# Patient Record
Sex: Female | Born: 1950 | Hispanic: No | Smoking: Never smoker
Health system: Southern US, Community
[De-identification: ages and names within clinical notes are randomized; demographics above are authoritative.]

## PROBLEM LIST (undated history)

## (undated) DIAGNOSIS — E119 Type 2 diabetes mellitus without complications: Secondary | ICD-10-CM

## (undated) DIAGNOSIS — I639 Cerebral infarction, unspecified: Secondary | ICD-10-CM

## (undated) DIAGNOSIS — G4733 Obstructive sleep apnea (adult) (pediatric): Secondary | ICD-10-CM

## (undated) DIAGNOSIS — M545 Low back pain, unspecified: Secondary | ICD-10-CM

## (undated) DIAGNOSIS — F419 Anxiety disorder, unspecified: Secondary | ICD-10-CM

## (undated) DIAGNOSIS — H409 Unspecified glaucoma: Secondary | ICD-10-CM

## (undated) DIAGNOSIS — I517 Cardiomegaly: Principal | ICD-10-CM

## (undated) DIAGNOSIS — I10 Essential (primary) hypertension: Secondary | ICD-10-CM

## (undated) DIAGNOSIS — Z974 Presence of external hearing-aid: Secondary | ICD-10-CM

## (undated) DIAGNOSIS — Z923 Personal history of irradiation: Secondary | ICD-10-CM

## (undated) HISTORY — DX: Anxiety disorder, unspecified: F41.9

## (undated) HISTORY — PX: BREAST LUMPECTOMY: SHX2

## (undated) HISTORY — DX: Unspecified glaucoma: H40.9

## (undated) HISTORY — PX: PARTIAL HYSTERECTOMY: SHX80

## (undated) HISTORY — DX: Essential (primary) hypertension: I10

## (undated) HISTORY — DX: Cardiomegaly: I51.7

## (undated) HISTORY — DX: Presence of external hearing-aid: Z97.4

## (undated) HISTORY — DX: Cerebral infarction, unspecified: I63.9

## (undated) HISTORY — DX: Low back pain, unspecified: M54.50

## (undated) HISTORY — DX: Obstructive sleep apnea (adult) (pediatric): G47.33

## (undated) HISTORY — PX: EYE SURGERY: SHX253

---

## 1980-11-27 HISTORY — PX: TUBAL LIGATION: SHX77

## 2018-11-29 ENCOUNTER — Ambulatory Visit: Payer: Medicare Other | Admitting: Podiatry

## 2018-12-10 ENCOUNTER — Ambulatory Visit (INDEPENDENT_AMBULATORY_CARE_PROVIDER_SITE_OTHER): Payer: Medicare Other | Admitting: Podiatry

## 2018-12-10 ENCOUNTER — Encounter: Payer: Self-pay | Admitting: Podiatry

## 2018-12-10 VITALS — BP 134/80

## 2018-12-10 DIAGNOSIS — D1724 Benign lipomatous neoplasm of skin and subcutaneous tissue of left leg: Secondary | ICD-10-CM | POA: Diagnosis not present

## 2018-12-10 DIAGNOSIS — L84 Corns and callosities: Secondary | ICD-10-CM | POA: Diagnosis not present

## 2018-12-10 DIAGNOSIS — D1723 Benign lipomatous neoplasm of skin and subcutaneous tissue of right leg: Secondary | ICD-10-CM

## 2018-12-10 NOTE — Patient Instructions (Signed)

## 2018-12-24 ENCOUNTER — Ambulatory Visit: Payer: Medicare Other | Admitting: Sports Medicine

## 2018-12-28 ENCOUNTER — Encounter: Payer: Self-pay | Admitting: Podiatry

## 2018-12-28 NOTE — Progress Notes (Signed)
Subjective: [facility] Th[facility] presents today with cc calluses and corns.  She also complains of bulge in both of her ankles.  She relates the bulges are on the lateral aspects of both ankles and have been present for the past 10 years.  She relates no aggravating factors and no attempted treatment.  She is just curious as to what they are.  Past Medical History:  Diagnosis Date  . Anxiety   . HTN (hypertension)    Past Surgical History:  Procedure Laterality Date  . PARTIAL HYSTERECTOMY      No Known Allergies   Social History   Occupational History  . Not on file  Tobacco Use  . Smoking status: Never Smoker  . Smokeless tobacco: Never Used  Substance and Sexual Activity  . Alcohol use: Not on file  . Drug use: Not on file  . Sexual activity: Not on file     No family history on file.    There is no immunization history on file for this patient.   Review of systems: Positive Findings in bold print.  Constitutional:  chills, fatigue, fever, sweats, weight change Communication: Optometrist, sign Ecologist, hand writing, iPad/Android device Head: headaches, head injury Eyes: changes in vision, eye pain, glaucoma, cataracts, macular degeneration, diplopia, glare,  light sensitivity, eyeglasses or contacts, blindness Ears nose mouth throat: Hard of hearing, ringing in ears, deaf, sign language,  vertigo,   nosebleeds,  rhinitis,  cold sores, snoring, swollen glands Cardiovascular: HTN, edema, arrhythmia, pacemaker in place, defibrillator in place,  chest pain/tightness, chronic anticoagulation, blood clot, heart failure Peripheral Vascular: leg cramps, varicose veins, blood clots, lymphedema Respiratory:  difficulty breathing, denies congestion, SOB, wheezing, cough, emphysema Gastrointestinal: change in appetite or weight, abdominal pain, constipation, diarrhea, nausea, vomiting, vomiting blood, change in bowel habits, abdominal pain, jaundice, rectal bleeding,  hemorrhoids, Genitourinary:  nocturia,  pain on urination,  blood in urine, Foley catheter, urinary urgency Musculoskeletal: uses mobility aid,  cramping, stiff joints, painful joints, decreased joint motion, fractures, OA, gout Skin: +changes in toenails, color change, dryness, itching, mole changes,  rash  Neurological: headaches, numbness in feet, paresthesias in feet, burning in feet, fainting,  seizures, change in speech. denies headaches, memory problems/poor historian, cerebral palsy, weakness, paralysis Endocrine: diabetes, hypothyroidism, hyperthyroidism,  goiter, dry mouth, flushing, heat intolerance,  cold intolerance,  excessive thirst, denies polyuria,  nocturia Hematological:  easy bleeding, excessive bleeding, easy bruising, enlarged lymph nodes, on long term blood thinner, history of past transusions Allergy/immunological:  hives, eczema, frequent infections, multiple drug allergies, seasonal allergies, transplant recipient Psychiatric:  anxiety, depression, mood disorder, suicidal ideations, hallucinations   Objective: Vitals:   12/10/18 1132  BP: 134/80   Vascular Examination: Capillary refill time immediate x 10 digits Dorsalis pedis and posterior tibial pulses present b/l Digital hair present x 10 digits Skin temperature warm to warm b/l  Dermatological Examination: Skin with normal turgor, texture and tone  Toenails adequate length and well-maintained.  Corns noted fifth digits bilaterally.  Musculoskeletal: Muscle strength 5/5 to all LE muscle groups  Hallux abductovalgus with bunion deformity bilaterally  Hammertoe fifth digit bilaterally  She is noted to have juxtamalleolar lipomas bilateral ankles left greater than right.  There is no erythema, no edema, no drainage associated with these.  Neurological: Sensation intact with 10 gram monofilament. Vibratory sensation intact.  Assessment: Painful corns fifth digits bilaterally  Juxtamalleolar lipomas  bilaterally  Plan: 1. We discussed checks the juxtamalleolar lipomas.  I explained to her that these  are benign lesions.  They sometimes limit the type of shoe.  She can wear, for example, any shoes with straps going across the ankle  may be uncomfortable for her.   2. Corns pared without incident today.  Patient given silicone toe pads.  She may apply them every morning and remove them every evening.  She is only to wear them when she is wearing shoe gear.  She related understanding. 3. Patient to continue soft, supportive shoe gear 4. Patient to report any pedal injuries to medical professional immediately. 5. Follow up 3 months. Patient/POA to call should there be a concern in the interim.

## 2019-02-18 ENCOUNTER — Ambulatory Visit: Payer: Self-pay | Admitting: Family Medicine

## 2019-03-13 ENCOUNTER — Other Ambulatory Visit: Payer: Self-pay

## 2019-03-13 ENCOUNTER — Encounter: Payer: Self-pay | Admitting: Cardiology

## 2019-03-13 ENCOUNTER — Ambulatory Visit (INDEPENDENT_AMBULATORY_CARE_PROVIDER_SITE_OTHER): Payer: Medicare Other | Admitting: Cardiology

## 2019-03-13 VITALS — BP 163/93 | HR 75 | Ht 60.0 in | Wt 193.0 lb

## 2019-03-13 DIAGNOSIS — G4733 Obstructive sleep apnea (adult) (pediatric): Secondary | ICD-10-CM

## 2019-03-13 DIAGNOSIS — Z9989 Dependence on other enabling machines and devices: Secondary | ICD-10-CM

## 2019-03-13 DIAGNOSIS — R002 Palpitations: Secondary | ICD-10-CM

## 2019-03-13 DIAGNOSIS — I517 Cardiomegaly: Secondary | ICD-10-CM | POA: Diagnosis not present

## 2019-03-13 DIAGNOSIS — I10 Essential (primary) hypertension: Secondary | ICD-10-CM | POA: Diagnosis not present

## 2019-03-13 DIAGNOSIS — Z6837 Body mass index (BMI) 37.0-37.9, adult: Secondary | ICD-10-CM

## 2019-03-13 DIAGNOSIS — E6609 Other obesity due to excess calories: Secondary | ICD-10-CM

## 2019-03-13 DIAGNOSIS — I152 Hypertension secondary to endocrine disorders: Secondary | ICD-10-CM | POA: Insufficient documentation

## 2019-03-13 HISTORY — DX: Cardiomegaly: I51.7

## 2019-03-13 NOTE — Progress Notes (Signed)
Virtual Visit via Telephone Note: Patient unable to use video assisted device.  This visit type was conducted due to national recommendations for restrictions regarding the COVID-19 Pandemic (e.g. social distancing).  This format is felt to be most appropriate for this patient at this time.  All issues noted in this document were discussed and addressed.  No physical exam was performed.  The patient has consented to conduct a Telehealth visit and understands insurance will be billed.   I connected with@, on 03/13/19 at  by TELEPHONE and verified that I am speaking with the correct person using two identifiers.   I discussed the limitations of evaluation and management by telemedicine and the availability of in person appointments. The patient expressed understanding and agreed to proceed.   I have discussed with patient regarding the safety during COVID Pandemic and steps and precautions to be taken including social distancing, frequent hand wash and use of detergent soap, gels with the patient. I asked the patient to avoid touching mouth, nose, eyes, ears with the hands. I encouraged regular walking around the neighborhood and exercise and regular diet, as long as social distancing can be maintained.   Subjective:  Primary Physician/Referring:  Jaynee Eagles, PA-C  Patient ID: Deanna Potter, female    DOB: [birth year], 68 y.o.   MRN: [id number]  Chief Complaint  Patient presents with  . Hypertension    HPI: Deanna Potter  is a 68 y.o. female  with Patient with hypertension, chronic palpitations, very mild hyperlipidemia, obstructive sleep apnea on CPAP, recently moved from [place]  And then to  [facility] a year ago and then to [place] and presents to establish cardiac care.  Patient has had chronic palpitations for many years and last a few seconds and mostly noted during stressful situations or during rest.  Denies chest pain, has mild dyspnea on exertion that is stable and also has  obstructive sleep apnea and is compliant with CPAP.  She was told to have mild cardiomegaly about 3 years ago during an echocardiogram and stress testing.  No other specifics available.  Past Medical History:  Diagnosis Date  . Anxiety   . Cardiomegaly 03/13/2019  . HTN (hypertension)     Past Surgical History:  Procedure Laterality Date  . PARTIAL HYSTERECTOMY      Social History   Socioeconomic History  . Marital status: Widowed    Spouse name: Not on file  . Number of children: 2  . Years of education: Not on file  . Highest education level: Not on file  Occupational History  . Not on file  Social Needs  . Financial resource strain: Not on file  . Food insecurity:    Worry: Not on file    Inability: Not on file  . Transportation needs:    Medical: Not on file    Non-medical: Not on file  Tobacco Use  . Smoking status: Never Smoker  . Smokeless tobacco: Never Used  Substance and Sexual Activity  . Alcohol use: Not Currently    Frequency: Never  . Drug use: Never  . Sexual activity: Not on file  Lifestyle  . Physical activity:    Days per week: Not on file    Minutes per session: Not on file  . Stress: Not on file  Relationships  . Social connections:    Talks on phone: Not on file    Gets together: Not on file    Attends religious service: Not on file    Active  member of club or organization: Not on file    Attends meetings of clubs or organizations: Not on file    Relationship status: Not on file  . Intimate partner violence:    Fear of current or ex partner: Not on file    Emotionally abused: Not on file    Physically abused: Not on file    Forced sexual activity: Not on file  Other Topics Concern  . Not on file  Social History Narrative  . Not on file    Current Outpatient Medications on File Prior to Visit  Medication Sig Dispense Refill  . latanoprost (XALATAN) 0.005 % ophthalmic solution 1 drop at bedtime.    . Multiple Vitamin (MULTIVITAMIN)  capsule Take 1 capsule by mouth daily.     No current facility-administered medications on file prior to visit.     Review of Systems  Constitution: Negative for chills, decreased appetite, malaise/fatigue and weight gain.  HENT: Positive for hearing loss (moderate).   Eyes: Positive for visual disturbance (glaucoma).  Cardiovascular: Negative for dyspnea on exertion, leg swelling and syncope.  Respiratory: Positive for snoring (on CPAP).   Endocrine: Negative for cold intolerance.  Hematologic/Lymphatic: Does not bruise/bleed easily.  Musculoskeletal: Positive for back pain (chronic). Negative for joint swelling.  Gastrointestinal: Negative for abdominal pain, anorexia and change in bowel habit.  Neurological: Negative for headaches and light-headedness.  Psychiatric/Behavioral: Negative for depression and substance abuse.  All other systems reviewed and are negative.     Objective:  Blood pressure (!) 163/93, pulse 75, height 5' (1.524 m), weight 193 lb (87.5 kg). Body mass index is 37.69 kg/m.  Physical Exam  Not performed as it set telephone virtual visit encounter. Radiology: No results found.  Laboratory Examination: Labs 02/03/2019: HB 13.5/HCT 40.9, normal indicis, platelets 184.  BUN 12, creatinine 0.92, eGFR 65 mL.  Potassium 4.1.  CMP normal.  Total cholesterol 179, triglycerides 121, HDL 60, LDL 107.  Non-HDL cholesterol 129.  Cardiac studies:   Echo and stress in [place]: Told to be normal function and told to have mild cardiomegaly.  Assessment:    Cardiomegaly  Palpitations  Essential hypertension  OSA on CPAP  Class 2 obesity due to excess calories without serious comorbidity with body mass index (BMI) of 37.0 to 37.9 in adult  EKG Not available  Recommendations:    Patient with hypertension, chronic palpitations, very mild hyperlipidemia, obstructive sleep apnea on CPAP, recently moved from [place]  And then to  [facility] a year ago and then to  [place] and presents to establish cardiac care.  She is presently doing well and symptoms of palpitations are chronic and last a few seconds.  As I do not have her echocardiogram of the stress report, Encouraged her to obtain reports from [place].  Her blood pressure is elevated today, patient's stress level is high today as her incarcerated son has sickle cell anemia and she is worried about him.  States that she records of blood pressure fairly regularly and always under 130 mmHg. Suspect the cardiomegaly is probably related to hypertension.  Would option will be to change amlodipine to diltiazem both for palpitations and hypertension but I do not want to do this now as she is stable.  I'll set her up to see me in the office in 3 months.  She does have obstructive sleep apnea and has been very compliant with CPAP.  I reviewed her labs, she has very minimal elevation in LDL but good HDL, no  other significant cardiovascular risks, she is nonsmoker.  I do not think she needs statin therapy, weight loss was discussed as she does have moderate obesity.  Adrian Prows, MD, [facility] 03/13/2019, 10:52 AM [facility]. [place] Pager: [phone] Office: [phone] If no answer Cell [phone]

## 2019-10-28 ENCOUNTER — Other Ambulatory Visit: Payer: Self-pay

## 2019-10-28 ENCOUNTER — Ambulatory Visit (INDEPENDENT_AMBULATORY_CARE_PROVIDER_SITE_OTHER): Payer: Medicare Other | Admitting: Cardiology

## 2019-10-28 ENCOUNTER — Encounter: Payer: Self-pay | Admitting: Cardiology

## 2019-10-28 VITALS — BP 119/77 | HR 80 | Ht 60.0 in | Wt 206.7 lb

## 2019-10-28 DIAGNOSIS — Z6837 Body mass index (BMI) 37.0-37.9, adult: Secondary | ICD-10-CM

## 2019-10-28 DIAGNOSIS — I517 Cardiomegaly: Secondary | ICD-10-CM | POA: Diagnosis not present

## 2019-10-28 DIAGNOSIS — I10 Essential (primary) hypertension: Secondary | ICD-10-CM

## 2019-10-28 DIAGNOSIS — R9431 Abnormal electrocardiogram [ECG] [EKG]: Secondary | ICD-10-CM

## 2019-10-28 DIAGNOSIS — E6609 Other obesity due to excess calories: Secondary | ICD-10-CM

## 2019-10-28 DIAGNOSIS — R002 Palpitations: Secondary | ICD-10-CM

## 2019-10-28 NOTE — Progress Notes (Signed)
Primary Physician/Referring:  Jaynee Eagles, PA-C  Patient ID: Deanna Potter, female    DOB: 27-Feb-1951, 68 y.o.   MRN: [id number]  Chief Complaint  Patient presents with  . cardiomegaly  . Hypertension   HPI:    Deanna Potter  is a 68 y.o. Caucasian female patient with hypertension, chronic palpitations, very mild hyperlipidemia, obstructive sleep apnea on CPAP, moved from [place] and then to  [place] in 2019 and then to [place] this year in 2020.  I saw her on a virtual visit for evaluation of cardiomegaly  on 03/13/2019.  Patient states that in 2019 she has had an echocardiogram which revealed normal LVEF in [place] however showed cardiomegaly.  She was also told to have normal nuclear stress test at that time.  Patient has chronic palpitations ongoing for several years.  Past medical history significant for hypertension, obstructive sleep apnea on CPAP and obesity.  She denies any worsening dyspnea, chest pain, dizziness or syncope. She presents for a six-month office visit.  States that she's doing well except for occasional palpitations.  She has not had any chest pain and denies any dyspnea, PND or orthopnea.  Past Medical History:  Diagnosis Date  . Anxiety   . Cardiomegaly 03/13/2019  . HTN (hypertension)    Past Surgical History:  Procedure Laterality Date  . PARTIAL HYSTERECTOMY     Social History   Socioeconomic History  . Marital status: Widowed    Spouse name: Not on file  . Number of children: 2  . Years of education: Not on file  . Highest education level: Not on file  Occupational History  . Not on file  Social Needs  . Financial resource strain: Not on file  . Food insecurity    Worry: Not on file    Inability: Not on file  . Transportation needs    Medical: Not on file    Non-medical: Not on file  Tobacco Use  . Smoking status: Never Smoker  . Smokeless tobacco: Never Used  Substance and Sexual Activity  . Alcohol use: Not Currently    Frequency:  Never  . Drug use: Never  . Sexual activity: Not on file  Lifestyle  . Physical activity    Days per week: Not on file    Minutes per session: Not on file  . Stress: Not on file  Relationships  . Social Herbalist on phone: Not on file    Gets together: Not on file    Attends religious service: Not on file    Active member of club or organization: Not on file    Attends meetings of clubs or organizations: Not on file    Relationship status: Not on file  . Intimate partner violence    Fear of current or ex partner: Not on file    Emotionally abused: Not on file    Physically abused: Not on file    Forced sexual activity: Not on file  Other Topics Concern  . Not on file  Social History Narrative  . Not on file   ROS  Review of Systems  Constitution: Negative for chills, decreased appetite, malaise/fatigue and weight gain.  HENT: Positive for hearing loss (moderate, uses hearing aids).   Eyes: Positive for visual disturbance (glaucoma).  Cardiovascular: Positive for palpitations (chronic and stable and brief). Negative for dyspnea on exertion, leg swelling and syncope.  Respiratory: Positive for snoring (on CPAP).   Endocrine: Negative for cold intolerance.  Hematologic/Lymphatic:  Does not bruise/bleed easily.  Musculoskeletal: Positive for back pain (chronic). Negative for joint swelling.  Gastrointestinal: Negative for abdominal pain, anorexia and change in bowel habit.  Neurological: Negative for headaches and light-headedness.  Psychiatric/Behavioral: Negative for depression and substance abuse.  All other systems reviewed and are negative.  Objective   Vitals with BMI 10/28/2019 03/13/2019 03/13/2019  Height 5' 0"  5' 0"  5' 0"   Weight 206 lbs 11 oz 193 lbs -  BMI 43.32 95.18 -  Systolic 841 660 -  Diastolic 77 93 -  Pulse 80 75 -      Physical Exam  Constitutional:  Short stature, moderately obese in no acute distress.  HENT:  Head: Atraumatic.  Eyes:  Conjunctivae are normal.  Neck: Neck supple. No JVD present. No thyromegaly present.  Cardiovascular: Normal rate, regular rhythm, normal heart sounds and intact distal pulses. Exam reveals no gallop.  No murmur heard. No leg edema, no JVD.  Pulmonary/Chest: Effort normal and breath sounds normal.  Abdominal: Soft. Bowel sounds are normal.  Musculoskeletal: Normal range of motion.  Neurological: She is alert.  Skin: Skin is warm and dry.  Psychiatric: She has a normal mood and affect.   Laboratory examination:   Labs 02/03/2019: HB 13.5/HCT 40.9, normal indicis, platelets 184.  BUN 12, creatinine 0.92, eGFR 65 mL.  Potassium 4.1.  CMP normal.  Total cholesterol 179, triglycerides 121, HDL 60, LDL 107.  Non-HDL cholesterol 129.  No results for input(s): NA, K, CL, CO2, GLUCOSE, BUN, CREATININE, CALCIUM, GFRNONAA, GFRAA in the last 8760 hours. CrCl cannot be calculated (No successful lab value found.).  No flowsheet data found. No flowsheet data found. Lipid Panel  No results found for: CHOL, TRIG, HDL, CHOLHDL, VLDL, LDLCALC, LDLDIRECT HEMOGLOBIN A1C No results found for: HGBA1C, MPG TSH No results for input(s): TSH in the last 8760 hours. Medications and allergies  No Known Allergies   Current Outpatient Medications  Medication Instructions  . amLODipine (NORVASC) 10 mg, Oral, Daily  . dorzolamide (TRUSOPT) 2 % ophthalmic solution 1 drop, Both Eyes, 3 times daily  . escitalopram (LEXAPRO) 20 mg, Oral, Daily  . latanoprost (XALATAN) 0.005 % ophthalmic solution 1 drop, Daily at bedtime  . Multiple Vitamin (MULTIVITAMIN) capsule 1 capsule, Oral, Daily  . traZODone (DESYREL) 100 mg, Oral, Daily    Radiology:  No results found. Cardiac Studies:   Echo and stress in [place]: Told to be normal function and told to have mild cardiomegaly. No ischemia.   Assessment     ICD-10-CM   1. Cardiomegaly  I51.7 EKG 12-Lead    PCV ECHOCARDIOGRAM COMPLETE  2. Palpitations  R00.2    3. Essential hypertension  I10 PCV ECHOCARDIOGRAM COMPLETE  4. Class 2 obesity due to excess calories without serious comorbidity with body mass index (BMI) of 37.0 to 37.9 in adult  E66.09    Z68.37   5. Nonspecific abnormal electrocardiogram (ECG) (EKG)  R94.31 PCV ECHOCARDIOGRAM COMPLETE    EKG 10/28/2019: Sinus rhythm with borderline first-degree AV block at the rate of 69 bpm, left atrial enlargement, marked high-voltage and IVCD, LVH with repolarization abnormality with ST-T wave changes in the lateral leads.  Normal QT interval.   Recommendations:  No orders of the defined types were placed in this encounter.  Hareem Tlatelpa  is a 68 y.o. Caucasian female patient with hypertension, chronic palpitations, very mild hyperlipidemia, obstructive sleep apnea on CPAP, moved from [place] and then to  [place] in 2019 and then to [place] this  year in 2020.  I saw her on a virtual visit for evaluation of cardiomegaly  on 03/13/2019.   She is presently doing well and essentially remains asymptomatic except for chronic palpitations.  These are very brief and no other associated symptoms.  There is no history of sudden cardiac death.  EKG is markedly abnormal.  She is severe LVH although her blood pressure is very well controlled with minimal medications.  Cannot exclude hypertrophic cardiomyopathy.  I would like to obtain an echocardiogram to establish a baseline.  She'll make an attempt to get records from [place] as well.  She does have obstructive sleep apnea and has been very compliant with CPAP.  I reviewed her labs, she has very minimal elevation in LDL but good HDL, no other significant cardiovascular risks, she is nonsmoker.  I do not think she needs statin therapy, weight loss was discussed as she does have moderate obesity. I will see her back on an annual basis.  Adrian Prows, MD, [facility] 10/28/2019, 4:05 PM [place] [facility]. [place] Pager: [phone] Office: [phone] If no answer  Cell [phone]

## 2019-11-04 ENCOUNTER — Telehealth: Payer: Self-pay

## 2019-11-04 NOTE — Telephone Encounter (Signed)
Patient called concerning a referral being sent from her doctor's office for an appointment with Dr. Louanne Skye.  Cb# is [phone].  Please advise.  Thank you.

## 2019-11-25 ENCOUNTER — Other Ambulatory Visit: Payer: Self-pay

## 2019-11-25 ENCOUNTER — Ambulatory Visit: Payer: Self-pay

## 2019-11-25 ENCOUNTER — Ambulatory Visit (INDEPENDENT_AMBULATORY_CARE_PROVIDER_SITE_OTHER): Payer: Medicare Other | Admitting: Physical Medicine and Rehabilitation

## 2019-11-25 ENCOUNTER — Telehealth: Payer: Self-pay | Admitting: Physical Medicine and Rehabilitation

## 2019-11-25 ENCOUNTER — Encounter: Payer: Self-pay | Admitting: Physical Medicine and Rehabilitation

## 2019-11-25 VITALS — Ht 60.0 in | Wt 207.0 lb

## 2019-11-25 DIAGNOSIS — M4316 Spondylolisthesis, lumbar region: Secondary | ICD-10-CM | POA: Diagnosis not present

## 2019-11-25 DIAGNOSIS — G8929 Other chronic pain: Secondary | ICD-10-CM | POA: Diagnosis not present

## 2019-11-25 DIAGNOSIS — M47816 Spondylosis without myelopathy or radiculopathy, lumbar region: Secondary | ICD-10-CM

## 2019-11-25 DIAGNOSIS — M5442 Lumbago with sciatica, left side: Secondary | ICD-10-CM | POA: Diagnosis not present

## 2019-11-25 NOTE — Progress Notes (Signed)
   Numeric Pain Rating Scale and Functional Assessment Average Pain 6 Pain Right Now 4 My pain is constant and sharp Pain is worse with: walking, standing and some activites Pain improves with: rest, heat/ice and medication   In the last MONTH (on 0-10 scale) has pain interfered with the following?  1. General activity like being  able to carry out your everyday physical activities such as walking, climbing stairs, carrying groceries, or moving a chair?  Rating(6)  2. Relation with others like being able to carry out your usual social activities and roles such as  activities at home, at work and in your community. Rating(2)  3. Enjoyment of life such that you have  been bothered by emotional problems such as feeling anxious, depressed or irritable?  Rating(4)

## 2019-11-25 NOTE — Telephone Encounter (Signed)
[id number] Injection(s), anesthetic agent and/or st more  Notification/Prior Authorization not required if procedure performed in Office; otherwise may be required for this service.

## 2019-11-25 NOTE — Progress Notes (Signed)
[facility] - 67 y.o. female MRN [id number]  Date of birth: 05-18-1951  Office Visit Note: Visit Date: 11/25/2019 PCP: Jaynee Eagles, PA-C Referred by: Jaynee Eagles, PA-C  Subjective: Chief Complaint  Patient presents with  . Lower Back - Pain  . Left Hip - Pain   HPI: Deanna Potter is a 68 y.o. female who comes in today For new patient evaluation of chronic worsening low back pain with some referral in the left hip.  Her history is such that she is from [place] and has had chronic back pain really for quite some time.  She endorses specific injury many years ago when she was working as a Surveyor, minerals with a very large patient.  She had severe low back pain after trying to catch the patient and really ended up falling under the patient and this seems to be a traumatic event for her but she ultimately recovered from that and then just has had chronic off-and-on back pain since that time.  Her current pain has been ongoing now for probably 4 years or so with off and on worsening.  She has moved to [place] and has been here for a couple of years without specific treatment for her low back.  She did have treatment in [place] at [facility].  She was told that she had some disc issues and she did go to a chiropractor for many sessions that actually seem to make the pain worse.  They had talked about injections but she has not had any injections of the lumbar spine.  She has had no lumbar spine surgery.  Evidently they did complete MRI of the lumbar spine but we do not have that for review.  Patient says she filled out some forms to have things sent here but we do not have those.  Nonetheless her pain is located in the lower back radiating her referring to the left hip sometimes down in the left posterior lateral leg more L5 or S1.  No numbness tingling or paresthesia no focal weakness.  No recent trauma.  She uses heat and ice for pain relief.  She does not like to take medications and says that she  has hypertension does not really want take medications that would affect that.  Nonetheless she does endorse using Aleve.  She rates her pain level as a 4 today but average pain is a 6.  Is worse with walking and standing better with rest and heat and ice and sitting.  She gets some uncomfortable problems with sitting as well.  X-rays of the lumbar spine were taken today and those are reviewed below and reviewed with the patient.  She does have spondylolisthesis of L4 on L5 with significant facet arthropathy at the lower levels.  Otherwise fairly normal anatomic alignment on the AP view without any issues hips.  She endorses no groin pain.  Review of Systems  Constitutional: Negative for chills, fever, malaise/fatigue and weight loss.  HENT: Negative for hearing loss and sinus pain.   Eyes: Negative for blurred vision, double vision and photophobia.  Respiratory: Negative for cough and shortness of breath.   Cardiovascular: Negative for chest pain, palpitations and leg swelling.  Gastrointestinal: Negative for abdominal pain, nausea and vomiting.  Genitourinary: Negative for flank pain.  Musculoskeletal: Positive for back pain and joint pain. Negative for myalgias.  Skin: Negative for itching and rash.  Neurological: Negative for tremors, focal weakness and weakness.  Endo/Heme/Allergies: Negative.   Psychiatric/Behavioral: Negative for  depression.  All other systems reviewed and are negative.  Otherwise per HPI.  Assessment & Plan: Visit Diagnoses:  1. Chronic left-sided low back pain with left-sided sciatica   2. Spondylosis without myelopathy or radiculopathy, lumbar region   3. Spondylolisthesis of lumbar region     Plan: Findings:  Chronic long-term history of mostly axial low back pain worse with standing and walking and twisting and housework.  No real radicular pain most of the time but occasionally with walking a good distance she will have pain down the left leg.  She gives a good  story for lumbar stenosis and also lumbar facet arthritis.  I think most of her pain is likely the facet joints given the exam and her complaints clinically.  We are going to get the MRI report if we can from [facility].  She did sign the paperwork for that today.  I do want to see her back for facet joint block at L4-5 at the level of listhesis.  We talked about core strengthening and weight loss.  She is an avid walker but has not been able to walk as much did with the pain.  Depending on relief with facet joint blocks could look at potential for radiofrequency ablation.  If it does not help any of the hip pain could look potentially at epidural injection.  Before that was done I would likely repeat the MRI depending on the age of the MRI when we do see the report.  I did suggest to her taking Tylenol 3 times per day and then only using the Aleve on bad days if she is really concerned about her blood pressure.  Other medication changes could be made depending on relief.  She might do well with regrouping with physical therapy even though chiropractic care seem to make it worse I think we might be able to get her through that with a good exercise program.    Meds & Orders: No orders of the defined types were placed in this encounter.   Orders Placed This Encounter  Procedures  . XR Lumbar Spine Complete    Follow-up: Return for L4-5 facet block.   Procedures: No procedures performed  No notes on file   Clinical History: No specialty comments available.   She reports that she has never smoked. She has never used smokeless tobacco. No results for input(s): HGBA1C, LABURIC in the last 8760 hours.  Objective:  VS:  HT:5' (152.4 cm)   WT:207 lb (93.9 kg)  BMI:40.43    BP:   HR: bpm  TEMP: ( )  RESP:  Physical Exam Vitals and nursing note reviewed.  Constitutional:      General: She is not in acute distress.    Appearance: Normal appearance. She is well-developed. She is obese.  HENT:      Head: Normocephalic and atraumatic.     Nose: Nose normal.     Mouth/Throat:     Mouth: Mucous membranes are moist.     Pharynx: Oropharynx is clear.  Eyes:     Conjunctiva/sclera: Conjunctivae normal.     Pupils: Pupils are equal, round, and reactive to light.  Cardiovascular:     Rate and Rhythm: Regular rhythm.  Pulmonary:     Effort: Pulmonary effort is normal. No respiratory distress.  Abdominal:     General: There is no distension.     Palpations: Abdomen is soft.     Tenderness: There is no guarding.  Musculoskeletal:  Cervical back: Normal range of motion and neck supple.     Right lower leg: No edema.     Left lower leg: No edema.     Comments: Patient has some difficulty going from sit to full extension.  She has pain with facet loading of the lumbar spine.  No pain over the greater trochanters and no pain hip rotation she has good distal strength with good strength with dorsiflexion plantarflexion EHL.  Good strength with knee flexion.  Knees show good varus and valgus stability.  No clonus.  Skin:    General: Skin is warm and dry.     Findings: No erythema or rash.  Neurological:     General: No focal deficit present.     Mental Status: She is alert and oriented to person, place, and time.     Motor: No abnormal muscle tone.     Coordination: Coordination normal.     Gait: Gait normal.  Psychiatric:        Mood and Affect: Mood normal.        Behavior: Behavior normal.        Thought Content: Thought content normal.     Ortho Exam Imaging: XR Lumbar Spine Complete  Result Date: 11/25/2019 4 view lumbar spine shows lumbar spondylosis at L4-5 and L5-S1 with degenerative grade 1 listhesis of L4 on L5 with disc height loss but without foraminal narrowing.  AP shows good alignment without scoliosis.  Pelvic heights are equal.  Mild sclerosis of the right sacroiliac joint compared to left.  Hip joints are well-maintained with very minimal arthritis.   Past  Medical/Family/Surgical/Social History: Medications & Allergies reviewed per EMR, new medications updated. Patient Active Problem List   Diagnosis Date Noted  . Cardiomegaly 03/13/2019  . Essential hypertension 03/13/2019  . OSA on CPAP 03/13/2019   Past Medical History:  Diagnosis Date  . Anxiety   . Cardiomegaly 03/13/2019  . HTN (hypertension)    History reviewed. No pertinent family history. Past Surgical History:  Procedure Laterality Date  . PARTIAL HYSTERECTOMY     Social History   Occupational History  . Not on file  Tobacco Use  . Smoking status: Never Smoker  . Smokeless tobacco: Never Used  Substance and Sexual Activity  . Alcohol use: Not Currently  . Drug use: Never  . Sexual activity: Not on file

## 2019-11-26 ENCOUNTER — Encounter: Payer: Self-pay | Admitting: Physical Medicine and Rehabilitation

## 2019-12-09 ENCOUNTER — Ambulatory Visit: Payer: Self-pay

## 2019-12-09 ENCOUNTER — Encounter: Payer: Self-pay | Admitting: Physical Medicine and Rehabilitation

## 2019-12-09 ENCOUNTER — Ambulatory Visit (INDEPENDENT_AMBULATORY_CARE_PROVIDER_SITE_OTHER): Payer: Medicare Other | Admitting: Physical Medicine and Rehabilitation

## 2019-12-09 ENCOUNTER — Other Ambulatory Visit: Payer: Self-pay

## 2019-12-09 VITALS — BP 128/80 | HR 88

## 2019-12-09 DIAGNOSIS — M47816 Spondylosis without myelopathy or radiculopathy, lumbar region: Secondary | ICD-10-CM

## 2019-12-09 MED ORDER — METHYLPREDNISOLONE ACETATE 80 MG/ML IJ SUSP
40.0000 mg | Freq: Once | INTRAMUSCULAR | Status: AC
  Administered 2019-12-09: 40 mg

## 2019-12-09 NOTE — Progress Notes (Signed)
Pt states pain in the center of the lower back. Pt states no major changes since 11/25/2019.    .Numeric Pain Rating Scale and Functional Assessment Average Pain 8   In the last MONTH (on 0-10 scale) has pain interfered with the following?  1. General activity like being  able to carry out your everyday physical activities such as walking, climbing stairs, carrying groceries, or moving a chair?  Rating(8)   +Driver, -BT, -Dye Allergies.

## 2020-01-16 ENCOUNTER — Other Ambulatory Visit: Payer: Self-pay | Admitting: Internal Medicine

## 2020-01-16 DIAGNOSIS — Z1231 Encounter for screening mammogram for malignant neoplasm of breast: Secondary | ICD-10-CM

## 2020-01-29 ENCOUNTER — Other Ambulatory Visit: Payer: Self-pay | Admitting: Family Medicine

## 2020-01-29 DIAGNOSIS — R109 Unspecified abdominal pain: Secondary | ICD-10-CM

## 2020-02-12 ENCOUNTER — Ambulatory Visit
Admission: RE | Admit: 2020-02-12 | Discharge: 2020-02-12 | Disposition: A | Discharge status: Home / Self Care | Payer: Medicare Other | Source: Ambulatory Visit | Attending: Internal Medicine | Admitting: Internal Medicine

## 2020-02-12 ENCOUNTER — Other Ambulatory Visit: Payer: Self-pay

## 2020-02-12 DIAGNOSIS — Z1231 Encounter for screening mammogram for malignant neoplasm of breast: Secondary | ICD-10-CM

## 2020-02-13 ENCOUNTER — Other Ambulatory Visit: Payer: Self-pay

## 2020-02-13 ENCOUNTER — Ambulatory Visit
Admission: RE | Admit: 2020-02-13 | Discharge: 2020-02-13 | Disposition: A | Discharge status: Home / Self Care | Payer: Medicare Other | Source: Ambulatory Visit | Attending: Family Medicine | Admitting: Family Medicine

## 2020-02-13 ENCOUNTER — Encounter: Payer: Self-pay | Admitting: Family Medicine

## 2020-02-13 DIAGNOSIS — R109 Unspecified abdominal pain: Secondary | ICD-10-CM

## 2020-02-17 ENCOUNTER — Other Ambulatory Visit: Payer: Self-pay | Admitting: Internal Medicine

## 2020-02-17 DIAGNOSIS — R928 Other abnormal and inconclusive findings on diagnostic imaging of breast: Secondary | ICD-10-CM

## 2020-02-20 ENCOUNTER — Ambulatory Visit: Payer: Medicare Other

## 2020-03-10 ENCOUNTER — Other Ambulatory Visit: Payer: Medicare Other

## 2020-03-17 ENCOUNTER — Other Ambulatory Visit: Payer: Self-pay | Admitting: Urgent Care

## 2020-03-19 ENCOUNTER — Other Ambulatory Visit: Payer: Self-pay

## 2020-03-19 ENCOUNTER — Ambulatory Visit
Admission: RE | Admit: 2020-03-19 | Discharge: 2020-03-19 | Disposition: A | Discharge status: Home / Self Care | Payer: Medicare Other | Source: Ambulatory Visit | Attending: Internal Medicine | Admitting: Internal Medicine

## 2020-03-19 ENCOUNTER — Other Ambulatory Visit: Payer: Self-pay | Admitting: Internal Medicine

## 2020-03-19 DIAGNOSIS — R928 Other abnormal and inconclusive findings on diagnostic imaging of breast: Secondary | ICD-10-CM

## 2020-03-19 DIAGNOSIS — R921 Mammographic calcification found on diagnostic imaging of breast: Secondary | ICD-10-CM

## 2020-03-21 DIAGNOSIS — C801 Malignant (primary) neoplasm, unspecified: Secondary | ICD-10-CM

## 2020-03-21 HISTORY — DX: Malignant (primary) neoplasm, unspecified: C80.1

## 2020-03-29 NOTE — Progress Notes (Signed)
[facility] - 69 y.o. female MRN [id number]  Date of birth: 03/15/1952  Office Visit Note: Visit Date: 12/09/2019 PCP: Deanna Eagles, PA-C Referred by: Deanna Eagles, PA-C  Subjective: Chief Complaint  Patient presents with  . Lower Back - Pain  . Right Leg - Pain  . Left Leg - Pain   HPI:  Deanna Potter is a 69 y.o. female who comes in today for planned Bilateral L4-L5 lumbar facet/medial branch block with fluoroscopic guidance.  The patient has failed conservative care including home exercise, medications, time and activity modification.  This injection will be diagnostic and hopefully therapeutic.  Please see requesting physician notes for further details and justification.  Exam shows concordant low back pain with facet joint loading and extension.   ROS Otherwise per HPI.  Assessment & Plan: Visit Diagnoses:  1. Spondylosis without myelopathy or radiculopathy, lumbar region     Plan: No additional findings.   Meds & Orders:  Meds ordered this encounter  Medications  . methylPREDNISolone acetate (DEPO-MEDROL) injection 40 mg    Orders Placed This Encounter  Procedures  . Facet Injection  . XR C-ARM NO REPORT    Follow-up: Return if symptoms worsen or fail to improve.   Procedures: No procedures performed  Lumbar Diagnostic Facet Joint Nerve Block with Fluoroscopic Guidance   Patient: Deanna Potter      Date of Birth: 03/15/1952 MRN: [id number] PCP: Deanna Eagles, PA-C      Visit Date: 12/09/2019   Universal Protocol:    Date/Time: 05/03/216:13 AM  Consent Given By: the patient  Position: PRONE  Additional Comments: Vital signs were monitored before and after the procedure. Patient was prepped and draped in the usual sterile fashion. The correct patient, procedure, and site was verified.   Injection Procedure Details:  Procedure Site One Meds Administered:  Meds ordered this encounter  Medications  . methylPREDNISolone acetate (DEPO-MEDROL) injection 40 mg     Laterality: Bilateral  Location/Site:  L4-L5  Needle size: 22 ga.  Needle type:spinal  Needle Placement: Oblique pedical  Findings:   -Comments: There was excellent flow of contrast along the articular pillars without intravascular flow.  Procedure Details: The fluoroscope beam is vertically oriented in AP and then obliqued 15 to 20 degrees to the ipsilateral side of the desired nerve to achieve the "Scotty dog" appearance.  The skin over the target area of the junction of the superior articulating process and the transverse process (sacral ala if blocking the L5 dorsal rami) was locally anesthetized with a 1 ml volume of 1% Lidocaine without Epinephrine.  The spinal needle was inserted and advanced in a trajectory view down to the target.   After contact with periosteum and negative aspirate for blood and CSF, correct placement without intravascular or epidural spread was confirmed by injecting 0.5 ml. of Isovue-250.  A spot radiograph was obtained of this image.    Next, a 0.5 ml. volume of the injectate described above was injected. The needle was then redirected to the other facet joint nerves mentioned above if needed.  Prior to the procedure, the patient was given a Pain Diary which was completed for baseline measurements.  After the procedure, the patient rated their pain every 30 minutes and will continue rating at this frequency for a total of 5 hours.  The patient has been asked to complete the Diary and return to [place] by mail, fax or hand delivered as soon as possible.   Additional Comments:  The patient tolerated  the procedure well Dressing: 2 x 2 sterile gauze and Band-Aid    Post-procedure details: Patient was observed during the procedure. Post-procedure instructions were reviewed.  Patient left the clinic in stable condition.    Clinical History: No specialty comments available.     Objective:  VS:  HT:    WT:   BMI:     BP:128/80  HR:88bpm  TEMP: ( )   RESP:  Physical Exam  Ortho Exam Imaging: No results found.

## 2020-03-29 NOTE — Procedures (Signed)
Lumbar Diagnostic Facet Joint Nerve Block with Fluoroscopic Guidance   Patient: [facility]      Date of Birth: 03/15/1952 MRN: [id number] PCP: Jaynee Eagles, PA-C      Visit Date: 12/09/2019   Universal Protocol:    Date/Time: 05/03/216:13 AM  Consent Given By: the patient  Position: PRONE  Additional Comments: Vital signs were monitored before and after the procedure. Patient was prepped and draped in the usual sterile fashion. The correct patient, procedure, and site was verified.   Injection Procedure Details:  Procedure Site One Meds Administered:  Meds ordered this encounter  Medications  . methylPREDNISolone acetate (DEPO-MEDROL) injection 40 mg     Laterality: Bilateral  Location/Site:  L4-L5  Needle size: 22 ga.  Needle type:spinal  Needle Placement: Oblique pedical  Findings:   -Comments: There was excellent flow of contrast along the articular pillars without intravascular flow.  Procedure Details: The fluoroscope beam is vertically oriented in AP and then obliqued 15 to 20 degrees to the ipsilateral side of the desired nerve to achieve the "Scotty dog" appearance.  The skin over the target area of the junction of the superior articulating process and the transverse process (sacral ala if blocking the L5 dorsal rami) was locally anesthetized with a 1 ml volume of 1% Lidocaine without Epinephrine.  The spinal needle was inserted and advanced in a trajectory view down to the target.   After contact with periosteum and negative aspirate for blood and CSF, correct placement without intravascular or epidural spread was confirmed by injecting 0.5 ml. of Isovue-250.  A spot radiograph was obtained of this image.    Next, a 0.5 ml. volume of the injectate described above was injected. The needle was then redirected to the other facet joint nerves mentioned above if needed.  Prior to the procedure, the patient was given a Pain Diary which was completed for baseline  measurements.  After the procedure, the patient rated their pain every 30 minutes and will continue rating at this frequency for a total of 5 hours.  The patient has been asked to complete the Diary and return to [place] by mail, fax or hand delivered as soon as possible.   Additional Comments:  The patient tolerated the procedure well Dressing: 2 x 2 sterile gauze and Band-Aid    Post-procedure details: Patient was observed during the procedure. Post-procedure instructions were reviewed.  Patient left the clinic in stable condition.

## 2020-04-01 ENCOUNTER — Ambulatory Visit
Admission: RE | Admit: 2020-04-01 | Discharge: 2020-04-01 | Disposition: A | Discharge status: Home / Self Care | Payer: Medicare Other | Source: Ambulatory Visit | Attending: Internal Medicine | Admitting: Internal Medicine

## 2020-04-01 ENCOUNTER — Other Ambulatory Visit: Payer: Self-pay

## 2020-04-01 DIAGNOSIS — R921 Mammographic calcification found on diagnostic imaging of breast: Secondary | ICD-10-CM

## 2020-04-02 ENCOUNTER — Encounter: Payer: Self-pay | Admitting: *Deleted

## 2020-04-02 DIAGNOSIS — D0512 Intraductal carcinoma in situ of left breast: Secondary | ICD-10-CM

## 2020-04-02 NOTE — Progress Notes (Signed)
Left message for a return phone call to schedule for [facility] 5/12

## 2020-04-05 ENCOUNTER — Telehealth: Payer: Self-pay | Admitting: *Deleted

## 2020-04-05 DIAGNOSIS — D0512 Intraductal carcinoma in situ of left breast: Secondary | ICD-10-CM | POA: Insufficient documentation

## 2020-04-05 DIAGNOSIS — Z170 Estrogen receptor positive status [ER+]: Secondary | ICD-10-CM | POA: Insufficient documentation

## 2020-04-05 NOTE — Telephone Encounter (Signed)
Left message for a return phone call to confirm [facility] appt.

## 2020-04-05 NOTE — Progress Notes (Signed)
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 Ductal carcinoma in situ (DCIS) of left breast  D05.12     HISTORY OF PRESENT ILLNESS::Deanna Potter is a 69 y.o. female who is presenting to the office today for evaluation of her newly diagnosed breast cancer. She is accompanied by her sister. She is doing well overall.   She had routine screening mammography on 02/12/2020 that showed possible masses in the right breast and possible masses with two groups of calcifications in the left breast. She underwent bilateral diagnostic mammography with tomography and bilateral breast ultrasonography at [facility] on 03/19/2020. Results showed suspicious coarse heterogenous calcifications with associated asymmetry within the upper outer left breast, suspicious amorphous calcifications within the upper outer left breast middle to posterior depth, benign calcifications within the medial aspect of the left breast, and benign predominantly retroareolar bilateral breast masses.  Biopsy on 04/01/2020 revealed high-grade ductal carcinoma in situ with calcifications and necrosis. Prognostic indicators significant for estrogen receptor, 100% positive and progesterone receptor, 90% positive, both with strong staining intensities.  Menarche: 69 years old Age at first live birth: 69 years old GP: 2 LMP: N/A Contraceptive: No HRT: No   The patient was referred today for presentation in the multidisciplinary conference.  Radiology studies and pathology slides were presented there for review and  discussion of treatment options.  A consensus was discussed regarding potential next steps.  PREVIOUS RADIATION THERAPY: No  PAST MEDICAL HISTORY:  Past Medical History:  Diagnosis Date  . Anxiety   . Cardiomegaly 03/13/2019  . HTN (hypertension)     PAST SURGICAL HISTORY: Past Surgical History:  Procedure Laterality Date  . PARTIAL HYSTERECTOMY      FAMILY HISTORY: No family history on file.  SOCIAL HISTORY:  Social History   Socioeconomic History  . Marital status: Widowed    Spouse name: Not on file  . Number of children: 2  . Years of education: Not on file  . Highest education level: Not on file  Occupational History  . Not on file  Tobacco Use  . Smoking status: Never Smoker  . Smokeless tobacco: Never Used  Substance and Sexual Activity  . Alcohol use: Not Currently  . Drug use: Never  . Sexual activity: Not on file  Other Topics Concern  . Not on file  Social History Narrative  . Not on file   Social Determinants of Health   Financial Resource Strain:   . Difficulty of Paying Living Expenses:   Food Insecurity:   . Worried About Charity fundraiser in the Last Year:   . Arboriculturist in the Last Year:   Transportation Needs:   . Film/video editor (Medical):   Marland Kitchen Lack of Transportation (Non-Medical):   Physical Activity:   . Days of Exercise per Week:   . Minutes of Exercise per Session:   Stress:   . Feeling of Stress :   Social Connections:   . Frequency of Communication with Friends and Family:   . Frequency of Social Gatherings with Friends  and Family:   . Attends Religious Services:   . Active Member of Clubs or Organizations:   . Attends Archivist Meetings:   Marland Kitchen Marital Status:     ALLERGIES: No Known Allergies  MEDICATIONS:  Current Outpatient Medications  Medication Sig Dispense Refill  . amLODipine (NORVASC) 10 MG tablet Take 10 mg by mouth daily.    . dorzolamide (TRUSOPT) 2 % ophthalmic solution Place 1 drop  into both eyes 3 (three) times daily.    Marland Kitchen escitalopram (LEXAPRO) 20 MG tablet Take 20 mg by mouth daily.    Marland Kitchen latanoprost (XALATAN) 0.005 % ophthalmic solution 1 drop at bedtime.    . Multiple Vitamin (MULTIVITAMIN) capsule Take 1 capsule by mouth daily.    . traZODone (DESYREL) 100 MG tablet Take 100 mg by mouth daily.      No current facility-administered medications for this encounter.    REVIEW OF SYSTEMS: A 10+ POINT REVIEW OF SYSTEMS WAS OBTAINED including neurology, dermatology, psychiatry, cardiac, respiratory, lymph, extremities, GI, GU, musculoskeletal, constitutional, reproductive, HEENT. On the provided form, she reports tinnitus, back pain, lump in left breast, joint pain, and history of arthritis. She also reports wearing glasses, a hearing aid, and dentures. She denies fever, chills, chest pain, shortness of breath, cough, nausea, vomiting, diarrhea, rash, and any other symptoms.    PHYSICAL EXAM:   Vitals with BMI 04/07/2020  Height 5\' 0"   Weight 202 lbs 13 oz  BMI [id number]  Systolic [id number]  Diastolic 74  Pulse 75   Lungs are clear to auscultation bilaterally. Heart has regular rate and rhythm. No palpable cervical, supraclavicular, or axillary adenopathy. Abdomen soft, non-tender, normal bowel sounds. Right breast with no palpable mass, nipple discharge, or bleeding.  Left breast with steri-strips in the upper central and upper outer quadrant from biopsy. Minimal bruising. No palpable mass, nipple discharge, or bleeding.  Both breasts large and pendulous  KPS = 90  100 - Normal; no complaints; no evidence of disease. 90   - Able to carry on normal activity; minor signs or symptoms of disease. 80   - Normal activity with effort; some signs or symptoms of disease. 52   - Cares for self; unable to carry on normal activity or to do active work. 60   - Requires occasional assistance, but is able to care for most of his personal needs. 50   - Requires considerable assistance and  frequent medical care. 11   - Disabled; requires special care and assistance. 4   - Severely disabled; hospital admission is indicated although death not imminent. 24   - Very sick; hospital admission necessary; active supportive treatment necessary. 10   - Moribund; fatal processes progressing rapidly. 0     - Dead  Karnofsky DA, Abelmann Jaconita, Craver LS and Burchenal [facility] [phone]) The use of the nitrogen mustards in the palliative treatment of carcinoma: with particular reference to bronchogenic carcinoma Cancer 1 [id number]  LABORATORY DATA:  Lab Results  Component Value Date   WBC 10.3 04/07/2020   HGB 13.3 04/07/2020   HCT 40.2 04/07/2020   MCV 77.8 (L) 04/07/2020   PLT 379 04/07/2020   Lab Results  Component Value Date   NA 143 04/07/2020   K 4.1 04/07/2020   CL 109 04/07/2020   CO2 22 04/07/2020   Lab Results  Component Value Date   ALT 23 04/07/2020   AST 20 04/07/2020   ALKPHOS 88 04/07/2020   BILITOT 0.3 04/07/2020  PULMONARY FUNCTION TEST:   Recent Review Flowsheet Data    There is no flowsheet data to display.      RADIOGRAPHY: US BREAST LTD UNI LEFT INC AXILLA  Result Date: 03/19/2020 CLINICAL DATA:  Patient recalled from screening for bilateral breast masses and left breast calcifications. At the time of the initial screening mammogram interpretation, no priors were available. At the time of the diagnostic examination, the patient expressed that she had prior exams at [facility] and these were obtained. EXAM: DIGITAL DIAGNOSTIC BILATERAL MAMMOGRAM WITH CAD AND TOMO ULTRASOUND BILATERAL BREAST COMPARISON:  Outside mammograms from [facility] dated 09/09/2018. ACR Breast Density Category b: There are scattered areas of fibroglandular density. FINDINGS: Magnification CC and true lateral views of the left breast were obtained. Within the upper-outer left breast middle depth there is a 9 x 7 mm group of coarse heterogeneous calcifications with an associated asymmetry.  Additionally, within the upper-outer left breast middle to posterior depth there is a 3 mm group of amorphous calcifications. Within the medial aspect of the left breast there are benign-appearing coarse calcifications. There are lobular and oval circumscribed masses within the anterior breast bilaterally, further evaluated with spot compression views. Overall these appear mammographically stable when compared to prior exam. Mammographic images were processed with CAD. Targeted ultrasound is performed, showing fibrocystic changes within the right breast 12 o'clock, 1 o'clock and 2 o'clock positions corresponding with the mammographically identified masses, compatible with benign process. Within the retroareolar left breast 12 o'clock and 1 o'clock position there are fibrocystic changes compatible with benign process. IMPRESSION: 1. Suspicious coarse heterogeneous calcifications with associated asymmetry within the upper-outer left breast. 2. Suspicious amorphous calcifications within the upper-outer left breast middle to posterior depth. 3. Benign calcifications within the medial aspect of the left breast. 4. Benign predominantly retroareolar bilateral breast masses. RECOMMENDATION: 1. Stereotactic guided core needle biopsy suspicious coarse heterogeneous calcifications upper outer left breast. 2. Stereotactic guided core needle biopsy amorphous calcifications within the upper-outer left breast middle to posterior depth. I have discussed the findings and recommendations with the patient. If applicable, a reminder letter will be sent to the patient regarding the next appointment. BI-RADS CATEGORY  4: Suspicious. Electronically Signed   By: Lovey Newcomer M.D.   On: 03/19/2020 16:28   US BREAST LTD UNI RIGHT INC AXILLA  Result Date: 03/19/2020 CLINICAL DATA:  Patient recalled from screening for bilateral breast masses and left breast calcifications. At the time of the initial screening mammogram interpretation, no  priors were available. At the time of the diagnostic examination, the patient expressed that she had prior exams at [facility] and these were obtained. EXAM: DIGITAL DIAGNOSTIC BILATERAL MAMMOGRAM WITH CAD AND TOMO ULTRASOUND BILATERAL BREAST COMPARISON:  Outside mammograms from [facility] dated 09/09/2018. ACR Breast Density Category b: There are scattered areas of fibroglandular density. FINDINGS: Magnification CC and true lateral views of the left breast were obtained. Within the upper-outer left breast middle depth there is a 9 x 7 mm group of coarse heterogeneous calcifications with an associated asymmetry. Additionally, within the upper-outer left breast middle to posterior depth there is a 3 mm group of amorphous calcifications. Within the medial aspect of the left breast there are benign-appearing coarse calcifications. There are lobular and oval circumscribed masses within the anterior breast bilaterally, further evaluated with spot compression views. Overall these appear mammographically stable when compared to prior exam. Mammographic images were processed with CAD. Targeted ultrasound is performed, showing fibrocystic changes within the right  breast 12 o'clock, 1 o'clock and 2 o'clock positions corresponding with the mammographically identified masses, compatible with benign process. Within the retroareolar left breast 12 o'clock and 1 o'clock position there are fibrocystic changes compatible with benign process. IMPRESSION: 1. Suspicious coarse heterogeneous calcifications with associated asymmetry within the upper-outer left breast. 2. Suspicious amorphous calcifications within the upper-outer left breast middle to posterior depth. 3. Benign calcifications within the medial aspect of the left breast. 4. Benign predominantly retroareolar bilateral breast masses. RECOMMENDATION: 1. Stereotactic guided core needle biopsy suspicious coarse heterogeneous calcifications upper outer left breast. 2.  Stereotactic guided core needle biopsy amorphous calcifications within the upper-outer left breast middle to posterior depth. I have discussed the findings and recommendations with the patient. If applicable, a reminder letter will be sent to the patient regarding the next appointment. BI-RADS CATEGORY  4: Suspicious. Electronically Signed   By: Lovey Newcomer M.D.   On: 03/19/2020 16:28   MM DIAG BREAST TOMO BILATERAL  Result Date: 03/19/2020 CLINICAL DATA:  Patient recalled from screening for bilateral breast masses and left breast calcifications. At the time of the initial screening mammogram interpretation, no priors were available. At the time of the diagnostic examination, the patient expressed that she had prior exams at [facility] and these were obtained. EXAM: DIGITAL DIAGNOSTIC BILATERAL MAMMOGRAM WITH CAD AND TOMO ULTRASOUND BILATERAL BREAST COMPARISON:  Outside mammograms from [facility] dated 09/09/2018. ACR Breast Density Category b: There are scattered areas of fibroglandular density. FINDINGS: Magnification CC and true lateral views of the left breast were obtained. Within the upper-outer left breast middle depth there is a 9 x 7 mm group of coarse heterogeneous calcifications with an associated asymmetry. Additionally, within the upper-outer left breast middle to posterior depth there is a 3 mm group of amorphous calcifications. Within the medial aspect of the left breast there are benign-appearing coarse calcifications. There are lobular and oval circumscribed masses within the anterior breast bilaterally, further evaluated with spot compression views. Overall these appear mammographically stable when compared to prior exam. Mammographic images were processed with CAD. Targeted ultrasound is performed, showing fibrocystic changes within the right breast 12 o'clock, 1 o'clock and 2 o'clock positions corresponding with the mammographically identified masses, compatible with benign process. Within  the retroareolar left breast 12 o'clock and 1 o'clock position there are fibrocystic changes compatible with benign process. IMPRESSION: 1. Suspicious coarse heterogeneous calcifications with associated asymmetry within the upper-outer left breast. 2. Suspicious amorphous calcifications within the upper-outer left breast middle to posterior depth. 3. Benign calcifications within the medial aspect of the left breast. 4. Benign predominantly retroareolar bilateral breast masses. RECOMMENDATION: 1. Stereotactic guided core needle biopsy suspicious coarse heterogeneous calcifications upper outer left breast. 2. Stereotactic guided core needle biopsy amorphous calcifications within the upper-outer left breast middle to posterior depth. I have discussed the findings and recommendations with the patient. If applicable, a reminder letter will be sent to the patient regarding the next appointment. BI-RADS CATEGORY  4: Suspicious. Electronically Signed   By: Lovey Newcomer M.D.   On: 03/19/2020 16:28   MM CLIP PLACEMENT LEFT  Result Date: 04/01/2020 CLINICAL DATA:  69 year old female presenting for biopsy of two areas of calcifications in the left breast. EXAM: DIAGNOSTIC LEFT MAMMOGRAM POST STEREOTACTIC BIOPSY COMPARISON:  Previous exam(s). FINDINGS: Mammographic images were obtained following stereotactic guided biopsy of calcifications in the upper central left breast. The coil biopsy marking clip is in expected position at the site of biopsy. Mammographic images were obtained following  stereotactic guided biopsy of calcifications in the upper outer left breast. The X biopsy marking clip is in expected position at the site of biopsy. IMPRESSION: Appropriate positioning of the coil shaped biopsy marking clip at the site of biopsy in the upper central left breast. Appropriate positioning of the X shaped biopsy marking clip at the site of biopsy in the upper outer left breast. Final Assessment: Post Procedure Mammograms for  Marker Placement Electronically Signed   By: Audie Pinto M.D.   On: 04/01/2020 11:14   MM LT BREAST BX W LOC DEV 1ST LESION IMAGE BX SPEC STEREO GUIDE  Addendum Date: 04/02/2020   ADDENDUM REPORT: 04/02/2020 14:16 ADDENDUM: Pathology revealed HIGH GRADE DUCTAL CARCINOMA IN SITU WITH CALCIFICATIONS AND NECROSIS of the LEFT breast, upper central. This was found to be concordant by Dr. Audie Pinto. Pathology revealed BENIGN BREAST TISSUE WITH MICROCALCIFICATIONS of the LEFT breast, upper outer. This was found to be concordant by Dr. Audie Pinto. Pathology results were discussed with the patient by telephone. The patient reported doing well after the biopsies with tenderness at the sites. Post biopsy instructions and care were reviewed and questions were answered. The patient was encouraged to call [facility] for any additional concerns. The patient was referred to [facility] on Apr 07, 2020. Given HIGH GRADE, breast MRI recommended for extent. Pathology results reported by Stacie Acres RN on 04/02/2020. Electronically Signed   By: Audie Pinto M.D.   On: 04/02/2020 14:16   Result Date: 04/02/2020 CLINICAL DATA:  69 year old female presenting for biopsy of two sites of calcifications in the left breast. EXAM: LEFT BREAST STEREOTACTIC CORE NEEDLE BIOPSY x 2 COMPARISON:  Previous exams. FINDINGS: The patient and I discussed the procedure of stereotactic-guided biopsy including benefits and alternatives. We discussed the high likelihood of a successful procedure. We discussed the risks of the procedure including infection, bleeding, tissue injury, clip migration, and inadequate sampling. Informed written consent was given. The usual time out protocol was performed immediately prior to the procedure. 1. Using sterile technique and 1% Lidocaine as local anesthetic, under stereotactic guidance, a 9  gauge vacuum assisted device was used to perform core needle biopsy of calcifications in the upper central left breast using a superior approach. Specimen radiograph was performed showing at least 6 specimens with calcifications. Specimens with calcifications are identified for pathology. Lesion quadrant: Upper outer quadrant At the conclusion of the procedure, a coil tissue marker clip was deployed into the biopsy cavity. Follow-up 2-view mammogram was performed and dictated separately. 2. Using sterile technique and 1% Lidocaine as local anesthetic, under stereotactic guidance, a 9 gauge vacuum assisted device was used to perform core needle biopsy of calcifications in the upper-outer quadrant of the left breast using a superior approach. Specimen radiograph was performed showing at least 1 specimen with calcifications. Specimens with calcifications are identified for pathology. Lesion quadrant: Upper outer quadrant At the conclusion of the procedure, an X tissue marker clip was deployed into the biopsy cavity. Follow-up 2-view mammogram was performed and dictated separately. IMPRESSION: Stereotactic-guided biopsy of calcifications in the upper central and upper outer left breast. No apparent complications. Electronically Signed: By: Audie Pinto M.D. On: 04/01/2020 11:16   MM LT BREAST BX W LOC DEV EA AD LESION IMG BX SPEC STEREO GUIDE  Addendum Date: 04/02/2020   ADDENDUM REPORT: 04/02/2020 14:16 ADDENDUM: Pathology revealed HIGH GRADE DUCTAL CARCINOMA IN SITU WITH  CALCIFICATIONS AND NECROSIS of the LEFT breast, upper central. This was found to be concordant by Dr. Audie Pinto. Pathology revealed BENIGN BREAST TISSUE WITH MICROCALCIFICATIONS of the LEFT breast, upper outer. This was found to be concordant by Dr. Audie Pinto. Pathology results were discussed with the patient by telephone. The patient reported doing well after the biopsies with tenderness at the sites. Post biopsy instructions  and care were reviewed and questions were answered. The patient was encouraged to call [facility] for any additional concerns. The patient was referred to [facility] on Apr 07, 2020. Given HIGH GRADE, breast MRI recommended for extent. Pathology results reported by Stacie Acres RN on 04/02/2020. Electronically Signed   By: Audie Pinto M.D.   On: 04/02/2020 14:16   Result Date: 04/02/2020 CLINICAL DATA:  69 year old female presenting for biopsy of two sites of calcifications in the left breast. EXAM: LEFT BREAST STEREOTACTIC CORE NEEDLE BIOPSY x 2 COMPARISON:  Previous exams. FINDINGS: The patient and I discussed the procedure of stereotactic-guided biopsy including benefits and alternatives. We discussed the high likelihood of a successful procedure. We discussed the risks of the procedure including infection, bleeding, tissue injury, clip migration, and inadequate sampling. Informed written consent was given. The usual time out protocol was performed immediately prior to the procedure. 1. Using sterile technique and 1% Lidocaine as local anesthetic, under stereotactic guidance, a 9 gauge vacuum assisted device was used to perform core needle biopsy of calcifications in the upper central left breast using a superior approach. Specimen radiograph was performed showing at least 6 specimens with calcifications. Specimens with calcifications are identified for pathology. Lesion quadrant: Upper outer quadrant At the conclusion of the procedure, a coil tissue marker clip was deployed into the biopsy cavity. Follow-up 2-view mammogram was performed and dictated separately. 2. Using sterile technique and 1% Lidocaine as local anesthetic, under stereotactic guidance, a 9 gauge vacuum assisted device was used to perform core needle biopsy of calcifications in the upper-outer quadrant of the left breast using a  superior approach. Specimen radiograph was performed showing at least 1 specimen with calcifications. Specimens with calcifications are identified for pathology. Lesion quadrant: Upper outer quadrant At the conclusion of the procedure, an X tissue marker clip was deployed into the biopsy cavity. Follow-up 2-view mammogram was performed and dictated separately. IMPRESSION: Stereotactic-guided biopsy of calcifications in the upper central and upper outer left breast. No apparent complications. Electronically Signed: By: Audie Pinto M.D. On: 04/01/2020 11:16      IMPRESSION: Stage 0 Left Breast UOQ, DCIS, ER+ / PR+, Grade 3  Patient will be a good candidate for breast conservation with radiotherapy to the left breast. Of note, she has a history of back pain and will be evaluated by plastic surgery for breast reduction after lumpectomy. We discussed the general course of radiation, potential side effects, and toxicities with radiation and the patient is interested in this approach.   PLAN:  1. MRI 2. Left lumpectomy 3. Adjuvant radiation therapy 4. Aromatase inhibitor   ------------------------------------------------  Blair Promise, PhD, MD  This document serves as a record of services personally performed by Gery Pray, MD. It was created on his behalf by Clerance Lav, a trained medical scribe. The creation of this record is based on the scribe's personal observations and the provider's statements to them. This document has been checked and approved by the attending provider.

## 2020-04-06 ENCOUNTER — Telehealth: Payer: Self-pay | Admitting: *Deleted

## 2020-04-06 NOTE — Telephone Encounter (Signed)
Have been unable to reach patient by phone.  Intake form, map, letter with appointment time and date have been emailed to patient's email listed in epic

## 2020-04-06 NOTE — Progress Notes (Signed)
Deanna Potter NOTE  Patient Care Team: Jaynee Eagles, PA-C as PCP - General (Urgent Care) Mauro Kaufmann, RN as Oncology Nurse Navigator Rockwell Germany, RN as Oncology Nurse Navigator Nicholas Lose, MD as Consulting Physician (Hematology and Oncology) Jovita Kussmaul, MD as Consulting Physician (General Surgery) Gery Pray, MD as Consulting Physician (Radiation Oncology)  CHIEF COMPLAINTS/PURPOSE OF CONSULTATION:  Newly diagnosed breast cancer  HISTORY OF PRESENTING ILLNESS:  [facility] 69 y.o. female is here because of recent diagnosis of eft breast ductal carcinoma in situ. Screening mammogram on 02/16/20 detected right breast masses and left breast masses and calcifications. Diagnostic mammogram on 03/19/20 showed benign right breast masses, and in the left breast, 2 benign masses and a 0.9cm group of calcifications in the upper breast. Biopsy on 04/01/20 showed high grade ductal carcinoma in situ, ER+ 100%, PR+ 90%. She presents to the clinic today for initial evaluation and discussion of treatment options.   I reviewed her records extensively and collaborated the history with the patient.  SUMMARY OF ONCOLOGIC HISTORY: Oncology History  Ductal carcinoma in situ (DCIS) of left breast  04/05/2020 Initial Diagnosis   Screening mammogram detected right breast masses and left breast masses and calcifications. Diagnostic mammogram showed benign right breast masses, and in the left breast, 2 benign masses and a 0.9cm group of calcifications in the upper, outer breast. 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     MEDICAL HISTORY:  Past Medical History:  Diagnosis Date  . Anxiety   . Cardiomegaly 03/13/2019  . HTN (hypertension)     SURGICAL HISTORY: Past Surgical History:  Procedure Laterality Date  . PARTIAL HYSTERECTOMY      SOCIAL HISTORY: Social History   Socioeconomic History  . Marital status: Widowed    Spouse name: Not on file  . Number  of children: 2  . Years of education: Not on file  . Highest education level: Not on file  Occupational History  . Not on file  Tobacco Use  . Smoking status: Never Smoker  . Smokeless tobacco: Never Used  Substance and Sexual Activity  . Alcohol use: Not Currently  . Drug use: Never  . Sexual activity: Not on file  Other Topics Concern  . Not on file  Social History Narrative  . Not on file   Social Determinants of Health   Financial Resource Strain:   . Difficulty of Paying Living Expenses:   Food Insecurity:   . Worried About Charity fundraiser in the Last Year:   . Arboriculturist in the Last Year:   Transportation Needs:   . Film/video editor (Medical):   Marland Kitchen Lack of Transportation (Non-Medical):   Physical Activity:   . Days of Exercise per Week:   . Minutes of Exercise per Session:   Stress:   . Feeling of Stress :   Social Connections:   . Frequency of Communication with Friends and Family:   . Frequency of Social Gatherings with Friends and Family:   . Attends Religious Services:   . Active Member of Clubs or Organizations:   . Attends Archivist Meetings:   Marland Kitchen Marital Status:   Intimate Partner Violence:   . Fear of Current or Ex-Partner:   . Emotionally Abused:   Marland Kitchen Physically Abused:   . Sexually Abused:     FAMILY HISTORY: History reviewed. No pertinent family history.  ALLERGIES:  has No Known Allergies.  MEDICATIONS:  Current Outpatient Medications  Medication Sig Dispense Refill  . amLODipine (NORVASC) 10 MG tablet Take 10 mg by mouth daily.    . dorzolamide (TRUSOPT) 2 % ophthalmic solution Place 1 drop into both eyes 3 (three) times daily.    Marland Kitchen escitalopram (LEXAPRO) 20 MG tablet Take 20 mg by mouth daily.    Marland Kitchen latanoprost (XALATAN) 0.005 % ophthalmic solution 1 drop at bedtime.    . Multiple Vitamin (MULTIVITAMIN) capsule Take 1 capsule by mouth daily.    . traZODone (DESYREL) 100 MG tablet Take 100 mg by mouth daily.      No  current facility-administered medications for this visit.    REVIEW OF SYSTEMS:   Constitutional: Denies fevers, chills or abnormal night sweats Eyes: Denies blurriness of vision, double vision or watery eyes Ears, nose, mouth, throat, and face: Denies mucositis or sore throat Respiratory: Denies cough, dyspnea or wheezes Cardiovascular: Denies palpitation, chest discomfort or lower extremity swelling Gastrointestinal:  Denies nausea, heartburn or change in bowel habits Skin: Denies abnormal skin rashes Lymphatics: Denies new lymphadenopathy or easy bruising Neurological:Denies numbness, tingling or new weaknesses Behavioral/Psych: Mood is stable, no new changes  Breast: Denies any palpable lumps or discharge All other systems were reviewed with the patient and are negative.  PHYSICAL EXAMINATION: ECOG PERFORMANCE STATUS: 1 - Symptomatic but completely ambulatory  Vitals:   04/07/20 0834  BP: 134/74  Pulse: 75  Resp: 18  Temp: 98.1 F (36.7 C)  SpO2: 96%   Filed Weights   04/07/20 0834  Weight: 202 lb 12.8 oz (92 kg)    GENERAL:alert, no distress and comfortable SKIN: skin color, texture, turgor are normal, no rashes or significant lesions EYES: normal, conjunctiva are pink and non-injected, sclera clear OROPHARYNX:no exudate, no erythema and lips, buccal mucosa, and tongue normal  NECK: supple, thyroid normal size, non-tender, without nodularity LYMPH:  no palpable lymphadenopathy in the cervical, axillary or inguinal LUNGS: clear to auscultation and percussion with normal breathing effort HEART: regular rate & rhythm and no murmurs and no lower extremity edema ABDOMEN:abdomen soft, non-tender and normal bowel sounds Musculoskeletal:no cyanosis of digits and no clubbing  PSYCH: alert & oriented x 3 with fluent speech NEURO: no focal motor/sensory deficits BREAST: No palpable nodules in breast. No palpable axillary or supraclavicular lymphadenopathy (exam performed in  the presence of a chaperone)   LABORATORY DATA:  I have reviewed the data as listed Lab Results  Component Value Date   WBC 10.3 04/07/2020   HGB 13.3 04/07/2020   HCT 40.2 04/07/2020   MCV 77.8 (L) 04/07/2020   PLT 379 04/07/2020   Lab Results  Component Value Date   NA 143 04/07/2020   K 4.1 04/07/2020   CL 109 04/07/2020   CO2 22 04/07/2020    RADIOGRAPHIC STUDIES: I have personally reviewed the radiological reports and agreed with the findings in the report.  ASSESSMENT AND PLAN:  Ductal carcinoma in situ (DCIS) of left breast 04/05/2020:Screening mammogram detected right breast masses and left breast masses and calcifications. Diagnostic mammogram showed benign right breast masses, and in the left breast, 2 benign masses and a 0.9cm group of calcifications in the upper, outer breast. 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  Pathology review: I discussed with the patient the difference between DCIS and invasive breast cancer. It is considered a precancerous lesion. DCIS is classified as a 0. It is generally detected through mammograms as calcifications. We discussed the significance of grades and its impact  on prognosis. We also discussed the importance of ER and PR receptors and their implications to adjuvant treatment options. Prognosis of DCIS dependence on grade, comedo necrosis. It is anticipated that if not treated, 20-30% of DCIS can develop into invasive breast cancer.  Recommendation: 1. Breast conserving surgery 2. Followed by adjuvant radiation therapy 3. Followed by antiestrogen therapy with tamoxifen 5 years  Tamoxifen counseling: We discussed the risks and benefits of tamoxifen. These include but not limited to insomnia, hot flashes, mood changes, vaginal dryness, and weight gain. Although rare, serious side effects including endometrial cancer, risk of blood clots were also discussed. We strongly believe that the benefits far outweigh the risks. Patient  understands these risks and consented to starting treatment. Planned treatment duration is 5 years.  Return to clinic after surgery to discuss the final pathology report and come up with an adjuvant treatment plan.    All questions were answered. The patient knows to call the clinic with any problems, questions or concerns.   Rulon Eisenmenger, MD, MPH 04/07/2020    I, Molly Dorshimer, am acting as scribe for Nicholas Lose, MD.  I have reviewed the above documentation for accuracy and completeness, and I agree with the above.

## 2020-04-07 ENCOUNTER — Ambulatory Visit: Payer: Self-pay | Admitting: General Surgery

## 2020-04-07 ENCOUNTER — Ambulatory Visit
Admission: RE | Admit: 2020-04-07 | Discharge: 2020-04-07 | Disposition: A | Discharge status: Home / Self Care | Payer: Medicare Other | Source: Ambulatory Visit | Attending: Radiation Oncology | Admitting: Radiation Oncology

## 2020-04-07 ENCOUNTER — Inpatient Hospital Stay: Payer: Medicare Other | Attending: Hematology and Oncology | Admitting: Hematology and Oncology

## 2020-04-07 ENCOUNTER — Encounter: Payer: Self-pay | Admitting: Licensed Clinical Social Worker

## 2020-04-07 ENCOUNTER — Other Ambulatory Visit: Payer: Self-pay

## 2020-04-07 ENCOUNTER — Other Ambulatory Visit: Payer: Self-pay | Admitting: *Deleted

## 2020-04-07 ENCOUNTER — Ambulatory Visit: Payer: Medicare Other | Attending: General Surgery | Admitting: Physical Therapy

## 2020-04-07 ENCOUNTER — Encounter: Payer: Self-pay | Admitting: Hematology and Oncology

## 2020-04-07 ENCOUNTER — Inpatient Hospital Stay: Payer: Medicare Other

## 2020-04-07 ENCOUNTER — Encounter: Payer: Self-pay | Admitting: *Deleted

## 2020-04-07 DIAGNOSIS — D0512 Intraductal carcinoma in situ of left breast: Secondary | ICD-10-CM

## 2020-04-07 LAB — CBC WITH DIFFERENTIAL (CANCER CENTER ONLY)
Abs Immature Granulocytes: 0.03 10*3/uL (ref 0.00–0.07)
Basophils Absolute: 0.1 10*3/uL (ref 0.0–0.1)
Basophils Relative: 1 %
Eosinophils Absolute: 0.3 10*3/uL (ref 0.0–0.5)
Eosinophils Relative: 3 %
HCT: 40.2 % (ref 36.0–46.0)
Hemoglobin: 13.3 g/dL (ref 12.0–15.0)
Immature Granulocytes: 0 %
Lymphocytes Relative: 29 %
Lymphs Abs: 3 10*3/uL (ref 0.7–4.0)
MCH: 25.7 pg — ABNORMAL LOW (ref 26.0–34.0)
MCHC: 33.1 g/dL (ref 30.0–36.0)
MCV: 77.8 fL — ABNORMAL LOW (ref 80.0–100.0)
Monocytes Absolute: 0.9 10*3/uL (ref 0.1–1.0)
Monocytes Relative: 9 %
Neutro Abs: 6 10*3/uL (ref 1.7–7.7)
Neutrophils Relative %: 58 %
Platelet Count: 379 10*3/uL (ref 150–400)
RBC: 5.17 MIL/uL — ABNORMAL HIGH (ref 3.87–5.11)
RDW: 13.9 % (ref 11.5–15.5)
WBC Count: 10.3 10*3/uL (ref 4.0–10.5)
nRBC: 0 % (ref 0.0–0.2)

## 2020-04-07 LAB — CMP (CANCER CENTER ONLY)
ALT: 23 U/L (ref 0–44)
AST: 20 U/L (ref 15–41)
Albumin: 3.8 g/dL (ref 3.5–5.0)
Alkaline Phosphatase: 88 U/L (ref 38–126)
Anion gap: 12 (ref 5–15)
BUN: 16 mg/dL (ref 8–23)
CO2: 22 mmol/L (ref 22–32)
Calcium: 10.2 mg/dL (ref 8.9–10.3)
Chloride: 109 mmol/L (ref 98–111)
Creatinine: 1.13 mg/dL — ABNORMAL HIGH (ref 0.44–1.00)
GFR, Est AFR Am: 58 mL/min — ABNORMAL LOW (ref >60)
GFR, Estimated: 50 mL/min — ABNORMAL LOW (ref >60)
Glucose, Bld: 105 mg/dL — ABNORMAL HIGH (ref 70–99)
Potassium: 4.1 mmol/L (ref 3.5–5.1)
Sodium: 143 mmol/L (ref 135–145)
Total Bilirubin: 0.3 mg/dL (ref 0.3–1.2)
Total Protein: 7.3 g/dL (ref 6.5–8.1)

## 2020-04-07 LAB — GENETIC SCREENING ORDER

## 2020-04-07 NOTE — Progress Notes (Signed)
Clinical Social Work [place] Psychosocial Distress Screening [place]   Patient completed distress screening protocol and scored a 0 on the Psychosocial Distress Thermometer which indicates no distress. Clinical Social Worker met with patient and patient's sister in [facility] to assess for distress and other psychosocial needs.  Patient stated she was feeling good after meeting with the treatment team and getting more information on her treatment plan.   She has support from her sister. 2 adult children living in [place], but they are not aware of diagnosis. Patient recently split with her fiance so is adjusting to changes in personal and home life. Currently lives alone. No needs identified today for basic needs support.  CSW and patient discussed common feeling and emotions when being diagnosed with cancer, and the importance of support during treatment.  CSW informed patient of the support team and support services at [facility].  CSW provided contact information and encouraged patient to call with any questions or concerns.    Distress Screen: ONCBCN DISTRESS SCREENING 04/07/2020  Screening Type Initial Screening  Distress experienced in past week (1-10) 0  Spiritual/Religous concerns type Relating to [place]

## 2020-04-07 NOTE — Assessment & Plan Note (Signed)
04/05/2020:Screening mammogram detected right breast masses and left breast masses and calcifications. Diagnostic mammogram showed benign right breast masses, and in the left breast, 2 benign masses and a 0.9cm group of calcifications in the upper, outer breast. 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  Pathology review: I discussed with the patient the difference between DCIS and invasive breast cancer. It is considered a precancerous lesion. DCIS is classified as a 0. It is generally detected through mammograms as calcifications. We discussed the significance of grades and its impact on prognosis. We also discussed the importance of ER and PR receptors and their implications to adjuvant treatment options. Prognosis of DCIS dependence on grade, comedo necrosis. It is anticipated that if not treated, 20-30% of DCIS can develop into invasive breast cancer.  Recommendation: 1. Breast conserving surgery 2. Followed by adjuvant radiation therapy 3. Followed by antiestrogen therapy with tamoxifen 5 years  Tamoxifen counseling: We discussed the risks and benefits of tamoxifen. These include but not limited to insomnia, hot flashes, mood changes, vaginal dryness, and weight gain. Although rare, serious side effects including endometrial cancer, risk of blood clots were also discussed. We strongly believe that the benefits far outweigh the risks. Patient understands these risks and consented to starting treatment. Planned treatment duration is 5 years.  Return to clinic after surgery to discuss the final pathology report and come up with an adjuvant treatment plan.

## 2020-04-12 ENCOUNTER — Encounter: Payer: Self-pay | Admitting: Physician Assistant

## 2020-04-13 ENCOUNTER — Other Ambulatory Visit: Payer: Self-pay

## 2020-04-13 ENCOUNTER — Encounter: Payer: Self-pay | Admitting: Plastic Surgery

## 2020-04-13 ENCOUNTER — Ambulatory Visit (INDEPENDENT_AMBULATORY_CARE_PROVIDER_SITE_OTHER): Payer: Medicare Other | Admitting: Plastic Surgery

## 2020-04-13 VITALS — BP 118/79 | HR 80 | Temp 97.7°F | Ht 61.0 in | Wt 203.2 lb

## 2020-04-13 DIAGNOSIS — I10 Essential (primary) hypertension: Secondary | ICD-10-CM

## 2020-04-13 DIAGNOSIS — D0512 Intraductal carcinoma in situ of left breast: Secondary | ICD-10-CM | POA: Diagnosis not present

## 2020-04-13 NOTE — Progress Notes (Signed)
Patient ID: Deanna Potter, female    DOB: 1951-04-21, 69 y.o.   MRN: [id number]   Chief Complaint  Patient presents with  . Consult    Breast reduction  . Breast Problem  . Breast Cancer    The patient is a 69 year old black female here for evaluation of her breasts for reconstruction.  She was diagnosed with left-sided ductal carcinoma in situ by a routine mammogram.  She never felt any abnormality.  She is planning on a partial mastectomy of the left breast followed by radiation.   She has extremely large breasts causing symptoms that include the following: Back pain in the upper and lower back, including neck pain. She pulls or pins her bra straps to provide better lift and relief of the pressure and pain. She notices relief by holding her breast up manually.  Her shoulder straps cause grooves and pain and pressure that requires padding for relief. Pain medication is sometimes required with motrin and tylenol.  Activities that are hindered by enlarged breasts include: exercise and running.  She has thought about a breast reduction but with her recent diagnosis decided that she may be able to have the reduction while taking care of of breast cancer at the same time.  This is certainly reasonable.  Her breasts are extremely large and fairly symmetric with the right slightly longer.  She has hyperpigmentation of the inframammary area on both sides.  The sternal to nipple distance on the right is 34 cm and the left is 33 cm.  The IMF distance is 16 cm.  She is 5 feet 1 inches tall and weighs 203 pounds.  Preoperative bra size = 40 DD cup.  The estimated excess breast tissue to be removed at the time of surgery = 600 grams on the left and 600 grams on the right.     Review of Systems  Constitutional: Negative.  Negative for activity change.  HENT: Negative.   Eyes: Negative.   Respiratory: Negative.   Cardiovascular: Negative.  Negative for leg swelling.  Gastrointestinal: Negative.  Negative  for abdominal distention.  Endocrine: Negative.   Musculoskeletal: Negative.   Hematological: Negative.   Psychiatric/Behavioral: Negative.     Past Medical History:  Diagnosis Date  . Anxiety   . Cardiomegaly 03/13/2019  . HTN (hypertension)     Past Surgical History:  Procedure Laterality Date  . PARTIAL HYSTERECTOMY        Current Outpatient Medications:  .  amLODipine (NORVASC) 10 MG tablet, Take 10 mg by mouth daily., Disp: , Rfl:  .  dorzolamide (TRUSOPT) 2 % ophthalmic solution, Place 1 drop into both eyes 3 (three) times daily., Disp: , Rfl:  .  escitalopram (LEXAPRO) 20 MG tablet, Take 20 mg by mouth daily., Disp: , Rfl:  .  latanoprost (XALATAN) 0.005 % ophthalmic solution, 1 drop at bedtime., Disp: , Rfl:  .  Multiple Vitamin (MULTIVITAMIN) capsule, Take 1 capsule by mouth daily., Disp: , Rfl:  .  traZODone (DESYREL) 100 MG tablet, Take 100 mg by mouth daily. , Disp: , Rfl:    Objective:   Vitals:   04/13/20 1137  BP: 118/79  Pulse: 80  Temp: 97.7 F (36.5 C)  SpO2: 99%    Physical Exam Vitals and nursing note reviewed.  Constitutional:      Appearance: Normal appearance.  HENT:     Head: Normocephalic and atraumatic.  Cardiovascular:     Rate and Rhythm: Normal rate.  Pulses: Normal pulses.  Pulmonary:     Effort: Pulmonary effort is normal.  Abdominal:     General: Abdomen is flat. There is no distension.     Tenderness: There is no abdominal tenderness.  Skin:    General: Skin is warm.     Capillary Refill: Capillary refill takes less than 2 seconds.  Neurological:     General: No focal deficit present.     Mental Status: She is alert and oriented to person, place, and time.  Psychiatric:        Mood and Affect: Mood normal.        Behavior: Behavior normal.        Thought Content: Thought content normal.     Assessment & Plan:  Ductal carcinoma in situ (DCIS) of left breast  Essential hypertension  Patient is a good candidate for  bilateral breast reduction immediate oncoplastic in coordination with Dr. Marlou Starks. I have spoken with Dr. Marlou Starks about the above information. Pictures were obtained of the patient and placed in the chart with the patient's or guardian's permission.   [place], DO

## 2020-04-15 ENCOUNTER — Encounter: Payer: Self-pay | Admitting: *Deleted

## 2020-04-15 ENCOUNTER — Telehealth: Payer: Self-pay | Admitting: *Deleted

## 2020-04-15 NOTE — Telephone Encounter (Signed)
Left vm regarding [facility] from 5.12.21. Contact information provided for questions or needs.

## 2020-04-19 ENCOUNTER — Other Ambulatory Visit: Payer: Self-pay | Admitting: General Surgery

## 2020-04-19 ENCOUNTER — Other Ambulatory Visit (HOSPITAL_COMMUNITY): Payer: Medicare Other

## 2020-04-19 DIAGNOSIS — D0512 Intraductal carcinoma in situ of left breast: Secondary | ICD-10-CM

## 2020-04-20 ENCOUNTER — Ambulatory Visit (HOSPITAL_COMMUNITY)
Admission: RE | Admit: 2020-04-20 | Discharge: 2020-04-20 | Disposition: A | Discharge status: Home / Self Care | Payer: Medicare Other | Source: Ambulatory Visit | Attending: General Surgery | Admitting: General Surgery

## 2020-04-20 ENCOUNTER — Other Ambulatory Visit: Payer: Self-pay

## 2020-04-20 ENCOUNTER — Encounter: Payer: Self-pay | Admitting: *Deleted

## 2020-04-20 DIAGNOSIS — D0512 Intraductal carcinoma in situ of left breast: Secondary | ICD-10-CM | POA: Insufficient documentation

## 2020-04-20 MED ORDER — GADOBUTROL 1 MMOL/ML IV SOLN
10.0000 mL | Freq: Once | INTRAVENOUS | Status: AC | PRN
  Administered 2020-04-20: 10 mL via INTRAVENOUS

## 2020-04-21 ENCOUNTER — Other Ambulatory Visit: Payer: Self-pay | Admitting: General Surgery

## 2020-04-21 ENCOUNTER — Encounter: Payer: Self-pay | Admitting: *Deleted

## 2020-04-21 DIAGNOSIS — R928 Other abnormal and inconclusive findings on diagnostic imaging of breast: Secondary | ICD-10-CM

## 2020-04-21 NOTE — Progress Notes (Signed)
Nutrition  Patient identified by attending Breast Clinic on 04/07/20.    Patient with DCIS of left breast.  Planning lumpectomy, followed by radiation and antiestrogens.  Chart reviewed.  Ht: 61 inches Wt: 203 lb 3.2 oz BMI: 38  Patient was provided nutrition packet with RD contact information at Breast Clinic on 5/12.  Patient currently not at nutrition risk.  RD available if needed for future nutritional concerns.    Deanna Potter, [place], [place] Registered Dietitian [phone] (pager)

## 2020-05-04 ENCOUNTER — Ambulatory Visit: Payer: Medicare Other | Admitting: Physician Assistant

## 2020-05-04 ENCOUNTER — Inpatient Hospital Stay: Admission: RE | Admit: 2020-05-04 | Payer: Medicare Other | Source: Ambulatory Visit

## 2020-05-05 ENCOUNTER — Encounter: Payer: Self-pay | Admitting: *Deleted

## 2020-05-10 ENCOUNTER — Ambulatory Visit: Payer: Medicare Other

## 2020-05-10 ENCOUNTER — Ambulatory Visit
Admission: RE | Admit: 2020-05-10 | Discharge: 2020-05-10 | Disposition: A | Discharge status: Home / Self Care | Payer: Medicare Other | Source: Ambulatory Visit | Attending: General Surgery | Admitting: General Surgery

## 2020-05-10 ENCOUNTER — Other Ambulatory Visit: Payer: Self-pay

## 2020-05-10 DIAGNOSIS — R928 Other abnormal and inconclusive findings on diagnostic imaging of breast: Secondary | ICD-10-CM

## 2020-05-13 ENCOUNTER — Other Ambulatory Visit: Payer: Medicare Other

## 2020-05-17 ENCOUNTER — Encounter: Payer: Self-pay | Admitting: *Deleted

## 2020-05-17 ENCOUNTER — Telehealth: Payer: Self-pay | Admitting: Hematology and Oncology

## 2020-05-17 NOTE — Telephone Encounter (Signed)
Scheduled appt per 6/21 sch message - no answer - mailed reminder letter.

## 2020-05-18 ENCOUNTER — Other Ambulatory Visit: Payer: Self-pay | Admitting: General Surgery

## 2020-05-18 ENCOUNTER — Ambulatory Visit: Admission: RE | Admit: 2020-05-18 | Payer: Medicare Other | Source: Ambulatory Visit

## 2020-05-18 ENCOUNTER — Ambulatory Visit
Admission: RE | Admit: 2020-05-18 | Discharge: 2020-05-18 | Disposition: A | Discharge status: Home / Self Care | Payer: Medicare Other | Source: Ambulatory Visit | Attending: General Surgery | Admitting: General Surgery

## 2020-05-18 ENCOUNTER — Ambulatory Visit: Payer: Medicare Other

## 2020-05-18 ENCOUNTER — Other Ambulatory Visit: Payer: Self-pay

## 2020-05-18 DIAGNOSIS — R928 Other abnormal and inconclusive findings on diagnostic imaging of breast: Secondary | ICD-10-CM

## 2020-05-18 MED ORDER — GADOBUTROL 1 MMOL/ML IV SOLN
9.0000 mL | Freq: Once | INTRAVENOUS | Status: AC | PRN
  Administered 2020-05-18: 9 mL via INTRAVENOUS

## 2020-05-18 NOTE — Progress Notes (Signed)
ICD-10-CM   1. Ductal carcinoma in situ (DCIS) of left breast  D05.12       Patient ID: Deanna Potter, female    DOB: October 14, 1951, 69 y.o.   MRN: [id number]   History of Present Illness: Deanna Potter is a 69 y.o.  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  She presents for preoperative evaluation for upcoming procedure, bilateral breast reduction-oncoplastic for left breast cancer with Dr. Marla Roe in conjunction with left breast lumpectomy with Dr. Marlou Starks, scheduled for 06/09/2020.  Summary from previous visit: Patient was diagnosed with left-sided ductal carcinoma in situ by routine mammogram.  She is scheduled for a partial mastectomy of the left breast followed by radiation.  She has a history of extremely large breasts causing neck and back pain symptoms.  The sternal to nipple distance on the right is 34 cm and the left is 33 cm.  The IMF distance is 16 cm.  She is 5 feet 1 inches tall and weighs 203 pounds.  Preoperative bra size equals 40 DD cup.  Would like to possibly be a C cup or at least something to help relieve back pain. The estimated excess breast tissue to be removed at time of surgery is 600 g from each side.  PMH Significant for: Anxiety, cardiomegaly (patient reports no issues related to this), and HTN (well controlled)  She will begin radiation ~ 3 weeks after her surgery.  The patient has not had problems with anesthesia.   Past Medical History: Allergies: No Known Allergies  Current Medications:  Current Outpatient Medications:  .  amLODipine (NORVASC) 10 MG tablet, Take 10 mg by mouth daily., Disp: , Rfl:  .  dorzolamide (TRUSOPT) 2 % ophthalmic solution, Place 1 drop into both eyes 3 (three) times daily., Disp: , Rfl:  .  escitalopram (LEXAPRO) 20 MG tablet, Take 20 mg by mouth daily., Disp: , Rfl:  .  latanoprost (XALATAN) 0.005 % ophthalmic solution, 1 drop at bedtime., Disp: , Rfl:  .  Multiple Vitamin  (MULTIVITAMIN) capsule, Take 1 capsule by mouth daily., Disp: , Rfl:  .  traZODone (DESYREL) 100 MG tablet, Take 100 mg by mouth daily. , Disp: , Rfl:   Past Medical Problems: Past Medical History:  Diagnosis Date  . Anxiety   . Cardiomegaly 03/13/2019  . HTN (hypertension)     Past Surgical History: Past Surgical History:  Procedure Laterality Date  . PARTIAL HYSTERECTOMY      Social History: Social History   Socioeconomic History  . Marital status: Widowed    Spouse name: Not on file  . Number of children: 2  . Years of education: Not on file  . Highest education level: Not on file  Occupational History  . Not on file  Tobacco Use  . Smoking status: Never Smoker  . Smokeless tobacco: Never Used  Vaping Use  . Vaping Use: Never used  Substance and Sexual Activity  . Alcohol use: Not Currently  . Drug use: Never  . Sexual activity: Not on file  Other Topics Concern  . Not on file  Social History Narrative  . Not on file   Social Determinants of Health   Financial Resource Strain:   . Difficulty of Paying Living Expenses:   Food Insecurity:   . Worried About Charity fundraiser in the Last Year:   . Arboriculturist in the Last Year:   Transportation Needs:   .  Lack of Transportation (Medical):   Marland Kitchen Lack of Transportation (Non-Medical):   Physical Activity:   . Days of Exercise per Week:   . Minutes of Exercise per Session:   Stress:   . Feeling of Stress :   Social Connections:   . Frequency of Communication with Friends and Family:   . Frequency of Social Gatherings with Friends and Family:   . Attends Religious Services:   . Active Member of Clubs or Organizations:   . Attends Archivist Meetings:   Marland Kitchen Marital Status:   Intimate Partner Violence:   . Fear of Current or Ex-Partner:   . Emotionally Abused:   Marland Kitchen Physically Abused:   . Sexually Abused:     Family History: History reviewed. No pertinent family history.  Review of  Systems: Review of Systems  Constitutional: Negative for chills and fever.  HENT: Negative for congestion and sore throat.   Respiratory: Negative for cough and shortness of breath.   Cardiovascular: Negative for chest pain and palpitations.  Gastrointestinal: Negative for abdominal pain, nausea and vomiting.  Musculoskeletal: Positive for back pain and neck pain. Negative for joint pain and myalgias.  Skin: Negative for itching and rash.    Physical Exam: Vital Signs BP 135/75 (BP Location: Left Arm, Patient Position: Sitting, Cuff Size: Large)   Pulse 93   Temp 97.7 F (36.5 C) (Temporal)   Ht 5\' 1"  (1.549 m)   Wt 206 lb 6.4 oz (93.6 kg)   SpO2 98%   BMI 39.00 kg/m  Physical Exam Constitutional:      Appearance: Normal appearance.  HENT:     Head: Normocephalic and atraumatic.  Eyes:     Extraocular Movements: Extraocular movements intact.  Cardiovascular:     Rate and Rhythm: Normal rate and regular rhythm.     Pulses: Normal pulses.     Heart sounds: Normal heart sounds.  Pulmonary:     Effort: Pulmonary effort is normal.     Breath sounds: Normal breath sounds. No wheezing, rhonchi or rales.  Chest:     Comments: Unable to perform breast / skin exam as patient declined. Abdominal:     General: Bowel sounds are normal.     Palpations: Abdomen is soft.  Musculoskeletal:        General: No swelling. Normal range of motion.     Cervical back: Normal range of motion.  Skin:    General: Skin is warm and dry.     Coloration: Skin is not pale.  Neurological:     General: No focal deficit present.     Mental Status: She is alert and oriented to person, place, and time.  Psychiatric:        Mood and Affect: Mood normal.        Behavior: Behavior normal.        Thought Content: Thought content normal.        Judgment: Judgment normal.     Assessment/Plan:  [name] scheduled for bilateral breast reduction-uncal plastic for left breast cancer with Dr. Marla Roe  to be completed in conjunction with left breast lumpectomy with Dr. Marlou Starks.  Risks, benefits, and alternatives of procedure discussed, questions answered and consent obtained.    Smoking Status: Non-smoker ; Counseling Given? N/A Last Mammogram: MRI 04/20/2020; Results: Right breast: No evidence of malignancy; left breast: Subtle linear non-mass enhancement extending anterior to the biopsy site in the superior central left breast.  In total this area spans 4.8 cm with  the clip located at the posterior aspect.  Caprini Score: 8 High; Risk Factors include: 69 year old female, Hx of cancer, visible varicose veins, BMI > 25, and length of planned surgery. Recommendation for mechanical and pharmacological prophylaxis during surgery. Encourage early ambulation.   Pictures obtained: 04/13/20  Post-op Rx sent to pharmacy: Keflex, Norco, Zofran  Patient was provided with the Breast Reduction Risks and General Surgical Risks consent document and Pain Medication Agreement prior to their appointment.  Patient did not bring their readers so asked to take consent home to read and return the next day, We discussed the risks and offered to answer any additional questions, once they read through the risk consent documents and Pain Medication Agreement.  Risk consent form and Pain Medication Agreement to be scanned into patient's chart.  The risk that can be encountered with breast reduction were discussed and include the following but not limited to these:  Breast asymmetry, fluid accumulation, firmness of the breast, inability to breast feed, loss of nipple or areola, skin loss, decrease or no nipple sensation, fat necrosis of the breast tissue, bleeding, infection, healing delay.  There are risks of anesthesia, changes to skin sensation and injury to nerves or blood vessels.  The muscle can be temporarily or permanently injured.  You may have an allergic reaction to tape, suture, glue, blood products which can result in  skin discoloration, swelling, pain, skin lesions, poor healing.  Any of these can lead to the need for revisonal surgery or stage procedures.  A reduction has potential to interfere with diagnostic procedures.  Nipple or breast piercing can increase risks of infection.  This procedure is best done when the breast is fully developed.  Changes in the breast will continue to occur over time.  Pregnancy can alter the outcomes of previous breast reduction surgery, weight gain and weigh loss can also effect the long term appearance.   The [place] was signed into law in 2016 which includes the topic of electronic health records.  This provides immediate access to information in MyChart.  This includes consultation notes, operative notes, office notes, lab results and pathology reports.  If you have any questions about what you read please let us know at your next visit or call us at the office.  We are right here with you.   Electronically signed by: Threasa Heads, PA-C 05/19/2020 4:55 PM

## 2020-05-19 ENCOUNTER — Encounter: Payer: Self-pay | Admitting: *Deleted

## 2020-05-19 ENCOUNTER — Encounter: Payer: Self-pay | Admitting: Plastic Surgery

## 2020-05-19 ENCOUNTER — Ambulatory Visit (INDEPENDENT_AMBULATORY_CARE_PROVIDER_SITE_OTHER): Payer: Medicare Other | Admitting: Plastic Surgery

## 2020-05-19 VITALS — BP 135/75 | HR 93 | Temp 97.7°F | Ht 61.0 in | Wt 206.4 lb

## 2020-05-19 DIAGNOSIS — D0512 Intraductal carcinoma in situ of left breast: Secondary | ICD-10-CM

## 2020-05-19 MED ORDER — CEPHALEXIN 500 MG PO CAPS: 500.0000 mg | ORAL_CAPSULE | Freq: Four times a day (QID) | ORAL | 0 refills | Status: AC

## 2020-05-19 MED ORDER — ONDANSETRON HCL 4 MG PO TABS: 4.0000 mg | ORAL_TABLET | Freq: Three times a day (TID) | ORAL | 0 refills | Status: DC | PRN

## 2020-05-19 MED ORDER — HYDROCODONE-ACETAMINOPHEN 5-325 MG PO TABS: 1.0000 | ORAL_TABLET | Freq: Three times a day (TID) | ORAL | 0 refills | Status: AC | PRN

## 2020-05-27 ENCOUNTER — Telehealth: Payer: Self-pay | Admitting: *Deleted

## 2020-05-27 ENCOUNTER — Encounter: Payer: Self-pay | Admitting: *Deleted

## 2020-05-27 NOTE — Telephone Encounter (Signed)
Left message on sister's voicemail to call regarding the need for add bx prior to her sx on 7/14.

## 2020-05-28 ENCOUNTER — Other Ambulatory Visit: Payer: Self-pay | Admitting: *Deleted

## 2020-05-28 ENCOUNTER — Telehealth: Payer: Self-pay | Admitting: Plastic Surgery

## 2020-05-28 NOTE — Telephone Encounter (Signed)
I called Ms. Deanna Potter again to see if we could make contact with her. She did pick up the phone. I informed her that Oncology and Dr. Ethlyn Gallery nurse have been trying to contact her, but her voicemail was full so we were unable to leave her a message. I explained how to clean out her voicemails so that we could get in touch with her. I did ask her about the biopsy that she was supposed to have done and she indicated that she "freaked out in the machine" and the doctor that was there told her that she did not have to have it. I explained that I am not familiar with the biopsy part of this surgery process, but that she needed to contact Oncology and Dr. Ethlyn Gallery office immediately to make sure we are all on the same page. I provided her the phone numbers that I have for both of those doctors and informed her that I would let both offices know that she and I spoke. I also gave her my direct phone number and asked her to call me if she was unable to speak to [facility] and Oncology. She expressed understanding.

## 2020-06-02 ENCOUNTER — Telehealth: Payer: Self-pay | Admitting: Hematology and Oncology

## 2020-06-02 ENCOUNTER — Other Ambulatory Visit: Payer: Self-pay | Admitting: General Surgery

## 2020-06-02 DIAGNOSIS — R9389 Abnormal findings on diagnostic imaging of other specified body structures: Secondary | ICD-10-CM

## 2020-06-02 NOTE — Telephone Encounter (Signed)
Scheduled appt per 7/4 sch msg - pt is aware of apt date and time.

## 2020-06-03 ENCOUNTER — Encounter: Payer: Self-pay | Admitting: *Deleted

## 2020-06-04 ENCOUNTER — Encounter: Payer: Self-pay | Admitting: *Deleted

## 2020-06-04 ENCOUNTER — Other Ambulatory Visit (HOSPITAL_COMMUNITY): Payer: Medicare Other

## 2020-06-05 ENCOUNTER — Other Ambulatory Visit (HOSPITAL_COMMUNITY): Payer: Medicare Other

## 2020-06-07 NOTE — Progress Notes (Signed)
Patient Care Team: Jaynee Eagles, PA-C as PCP - General (Urgent Care) Mauro Kaufmann, RN as Oncology Nurse Navigator Carlynn Spry, Charlott Holler, RN as Oncology Nurse Navigator Nicholas Lose, MD as Consulting Physician (Hematology and Oncology) Jovita Kussmaul, MD as Consulting Physician (General Surgery) Gery Pray, MD as Consulting Physician (Radiation Oncology)  DIAGNOSIS:    ICD-10-CM   1. Ductal carcinoma in situ (DCIS) of left breast  D05.12     SUMMARY OF ONCOLOGIC HISTORY: Oncology History  Ductal carcinoma in situ (DCIS) of left breast  04/05/2020 Initial Diagnosis   Screening mammogram detected right breast masses and left breast masses and calcifications. Diagnostic mammogram showed benign right breast masses, and in the left breast, 2 benign masses and a 0.9cm group of calcifications in the upper, outer breast. 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     CHIEF COMPLIANT: Follow-up of left breast DCIS  INTERVAL HISTORY: Deanna Potter is a 69 y.o. with above-mentioned history of left breast DCIS. She presents to the clinic today for follow-up.   ALLERGIES:  has No Known Allergies.  MEDICATIONS:  Current Outpatient Medications  Medication Sig Dispense Refill  . amLODipine (NORVASC) 10 MG tablet Take 10 mg by mouth at bedtime.     . B Complex Vitamins (VITAMIN B COMPLEX PO) Take 1 tablet by mouth daily.    . Cholecalciferol (DIALYVITE VITAMIN D 5000 PO) Take 5,000 mg by mouth daily.    Marland Kitchen Cod Liver Oil CAPS Take 1 capsule by mouth daily. Plus A&D    . escitalopram (LEXAPRO) 20 MG tablet Take 20 mg by mouth daily.    Marland Kitchen gabapentin (NEURONTIN) 100 MG capsule Take 100 mg by mouth 3 (three) times daily.    Marland Kitchen latanoprost (XALATAN) 0.005 % ophthalmic solution Place 1 drop into both eyes 2 (two) times daily.     . Magnesium 250 MG TABS Take 250 mg by mouth daily.    . Multiple Vitamin (MULTIVITAMIN) capsule Take 1 capsule by mouth daily. Centrum Silver    . ondansetron (ZOFRAN)  4 MG tablet Take 1 tablet (4 mg total) by mouth every 8 (eight) hours as needed for nausea or vomiting. (Patient not taking: Reported on 05/31/2020) 20 tablet 0  . traZODone (DESYREL) 100 MG tablet Take 100 mg by mouth at bedtime.      No current facility-administered medications for this visit.    PHYSICAL EXAMINATION: ECOG PERFORMANCE STATUS: 1 - Symptomatic but completely ambulatory  Vitals:   06/08/20 1504  BP: 137/82  Pulse: 97  Resp: 18  Temp: 98.7 F (37.1 C)  SpO2: 100%   Filed Weights   06/08/20 1504  Weight: 208 lb 8 oz (94.6 kg)    LABORATORY DATA:  I have reviewed the data as listed CMP Latest Ref Rng & Units 04/07/2020  Glucose 70 - 99 mg/dL 105(H)  BUN 8 - 23 mg/dL 16  Creatinine 0.44 - 1.00 mg/dL 1.13(H)  Sodium 135 - 145 mmol/L 143  Potassium 3.5 - 5.1 mmol/L 4.1  Chloride 98 - 111 mmol/L 109  CO2 22 - 32 mmol/L 22  Calcium 8.9 - 10.3 mg/dL 10.2  Total Protein 6.5 - 8.1 g/dL 7.3  Total Bilirubin 0.3 - 1.2 mg/dL 0.3  Alkaline Phos 38 - 126 U/L 88  AST 15 - 41 U/L 20  ALT 0 - 44 U/L 23    Lab Results  Component Value Date   WBC 10.3 04/07/2020   HGB 13.3 04/07/2020  HCT 40.2 04/07/2020   MCV 77.8 (L) 04/07/2020   PLT 379 04/07/2020   NEUTROABS 6.0 04/07/2020    ASSESSMENT & PLAN:  Ductal carcinoma in situ (DCIS) of left breast 04/05/2020:Screening mammogram detected right breast masses and left breast masses and calcifications. Diagnostic mammogram showed benign right breast masses, and in the left breast, 2 benign masses and a 0.9cm group of calcifications in the upper, outer breast. 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  Patient needs MRI guided biopsy which is scheduled for this Friday.  She is very anxious about it. I sent a prescription for Xanax that she would like to take before this procedure.  She has decided to proceed with surgery and therefore that is the plan.  She wants to undergo breast reduction as well. I would like to  see her 1 week after surgery to discuss pathology report.  No orders of the defined types were placed in this encounter.  The patient has a good understanding of the overall plan. she agrees with it. she will call with any problems that may develop before the next visit here.  Total time spent: 30 mins including face to face time and time spent for planning, charting and coordination of care  Nicholas Lose, MD 06/08/2020  I, Cloyde Reams Dorshimer, am acting as scribe for Dr. Nicholas Lose.  I have reviewed the above documentation for accuracy and completeness, and I agree with the above.

## 2020-06-08 ENCOUNTER — Other Ambulatory Visit: Payer: Self-pay

## 2020-06-08 ENCOUNTER — Inpatient Hospital Stay: Payer: Medicare Other | Attending: Hematology and Oncology | Admitting: Hematology and Oncology

## 2020-06-08 ENCOUNTER — Inpatient Hospital Stay: Admission: RE | Admit: 2020-06-08 | Payer: Medicare Other | Source: Ambulatory Visit

## 2020-06-08 DIAGNOSIS — D0512 Intraductal carcinoma in situ of left breast: Secondary | ICD-10-CM | POA: Insufficient documentation

## 2020-06-08 MED ORDER — ALPRAZOLAM 0.25 MG PO TABS
0.2500 mg | ORAL_TABLET | Freq: Every evening | ORAL | 0 refills | Status: DC | PRN
Start: 2020-06-08 — End: 2020-07-19

## 2020-06-08 NOTE — Assessment & Plan Note (Signed)
04/05/2020:Screening mammogram detected right breast masses and left breast masses and calcifications. Diagnostic mammogram showed benign right breast masses, and in the left breast, 2 benign masses and a 0.9cm group of calcifications in the upper, outer breast. 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

## 2020-06-09 ENCOUNTER — Telehealth: Payer: Self-pay | Admitting: Hematology and Oncology

## 2020-06-09 NOTE — Telephone Encounter (Signed)
Scheduled appt per 7/13 sch msg - unable to reach pt - left message with appt date and time   

## 2020-06-09 NOTE — Telephone Encounter (Signed)
No 7/13 los, no changes made to pt schedule 

## 2020-06-11 ENCOUNTER — Institutional Professional Consult (permissible substitution): Payer: Medicare Other | Admitting: Plastic Surgery

## 2020-06-11 ENCOUNTER — Ambulatory Visit: Payer: Self-pay | Admitting: General Surgery

## 2020-06-11 ENCOUNTER — Ambulatory Visit
Admission: RE | Admit: 2020-06-11 | Discharge: 2020-06-11 | Disposition: A | Discharge status: Home / Self Care | Payer: Medicare Other | Source: Ambulatory Visit | Attending: General Surgery | Admitting: General Surgery

## 2020-06-11 ENCOUNTER — Ambulatory Visit: Admission: RE | Admit: 2020-06-11 | Payer: Medicare Other | Source: Ambulatory Visit

## 2020-06-11 ENCOUNTER — Other Ambulatory Visit: Payer: Self-pay

## 2020-06-11 ENCOUNTER — Other Ambulatory Visit: Payer: Medicare Other

## 2020-06-11 ENCOUNTER — Other Ambulatory Visit: Payer: Self-pay | Admitting: General Surgery

## 2020-06-11 DIAGNOSIS — R9389 Abnormal findings on diagnostic imaging of other specified body structures: Secondary | ICD-10-CM

## 2020-06-11 DIAGNOSIS — D0512 Intraductal carcinoma in situ of left breast: Secondary | ICD-10-CM

## 2020-06-11 MED ORDER — GADOBUTROL 1 MMOL/ML IV SOLN
9.0000 mL | Freq: Once | INTRAVENOUS | Status: AC | PRN
  Administered 2020-06-11: 9 mL via INTRAVENOUS

## 2020-06-14 ENCOUNTER — Encounter: Payer: Self-pay | Admitting: *Deleted

## 2020-06-14 ENCOUNTER — Encounter: Payer: Self-pay | Admitting: Cardiology

## 2020-06-17 ENCOUNTER — Ambulatory Visit: Payer: Medicare Other | Admitting: Hematology and Oncology

## 2020-06-18 ENCOUNTER — Encounter: Payer: Medicare Other | Admitting: Plastic Surgery

## 2020-06-18 ENCOUNTER — Telehealth: Payer: Self-pay | Admitting: Licensed Clinical Social Worker

## 2020-06-18 NOTE — Telephone Encounter (Signed)
[facility] CSW Progress Note  Clinical Education officer, museum received phone call from patient and then patient's daughter-in-law Heidi to request assistance filling out medical release for a breast cancer retreat through [facility]. It will be from September 18-25 in [place]. Heidi e-mailed the release and CSW will request medical team to complete on Monday.   As retreat is from September 18-25, daughter-in-law asked if radiation will be able to begin after that (September 27). CSW informed that this request would have to go through medical team.     Paulo Fruit , LCSW

## 2020-06-27 NOTE — H&P (View-Only) (Signed)
ICD-10-CM   1. Ductal carcinoma in situ (DCIS) of left breast  D05.12   2. Symptomatic mammary hypertrophy  N62       Patient ID: Deanna Potter, female    DOB: 12/27/1950, 69 y.o.   MRN: [id number]   History of Present Illness: Deanna Potter is a 69 y.o.  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  She presents for preoperative evaluation for upcoming procedure, bilateral breast reduction - oncoplastic for left breast cancer with Dr. Marla Roe in conjunction with left breast lumpectomy with Dr. Marlou Starks, scheduled for 07/19/20.  Summary from previous visit: Patient was diagnosed with left-side ductal carcinoma in situ by routine mammogram. She is scheduled for a partial mastectomy of the left breast followed by radation. She has a history of extremely large breasts causing neck and back pain symptoms. The Sternal to Nipple distance on the right is 34 cm and the left is 33 cm. The IMF distance is 16 cm. She is 5\' 1"  tall and weighs 203 lbs. Per-op bra size is 40 DD. She would like to be a C cup. The estimated excess breast tissue to removed at time of surgery is 600 g from each side.  She will have radiation  PMH Significant for: Anxiety, cardiomegaly (pt reports no issues related to this), HTN (well controlled)  The patient has not had problems with anesthesia.   Past Medical History: Allergies: No Known Allergies  Current Medications:  Current Outpatient Medications:  .  ALPRAZolam (XANAX) 0.25 MG tablet, Take 1 tablet (0.25 mg total) by mouth at bedtime as needed for anxiety., Disp: 6 tablet, Rfl: 0 .  amLODipine (NORVASC) 10 MG tablet, Take 10 mg by mouth at bedtime. , Disp: , Rfl:  .  B Complex Vitamins (VITAMIN B COMPLEX PO), Take 1 tablet by mouth daily., Disp: , Rfl:  .  Cholecalciferol (DIALYVITE VITAMIN D 5000 PO), Take 5,000 mg by mouth daily., Disp: , Rfl:  .  Cod Liver Oil CAPS, Take 1 capsule by mouth daily. Plus A&D, Disp: , Rfl:  .   escitalopram (LEXAPRO) 20 MG tablet, Take 20 mg by mouth daily., Disp: , Rfl:  .  gabapentin (NEURONTIN) 100 MG capsule, Take 100 mg by mouth 3 (three) times daily., Disp: , Rfl:  .  latanoprost (XALATAN) 0.005 % ophthalmic solution, Place 1 drop into both eyes 2 (two) times daily. , Disp: , Rfl:  .  Magnesium 250 MG TABS, Take 250 mg by mouth daily., Disp: , Rfl:  .  Multiple Vitamin (MULTIVITAMIN) capsule, Take 1 capsule by mouth daily. Centrum Silver, Disp: , Rfl:  .  ondansetron (ZOFRAN) 4 MG tablet, Take 1 tablet (4 mg total) by mouth every 8 (eight) hours as needed for nausea or vomiting., Disp: 20 tablet, Rfl: 0 .  traZODone (DESYREL) 100 MG tablet, Take 100 mg by mouth at bedtime. , Disp: , Rfl:   Past Medical Problems: Past Medical History:  Diagnosis Date  . Anxiety   . Cardiomegaly 03/13/2019  . HTN (hypertension)     Past Surgical History: Past Surgical History:  Procedure Laterality Date  . PARTIAL HYSTERECTOMY      Social History: Social History   Socioeconomic History  . Marital status: Widowed    Spouse name: Not on file  . Number of children: 2  . Years of education: Not on file  . Highest education level: Not on file  Occupational History  .  Not on file  Tobacco Use  . Smoking status: Never Smoker  . Smokeless tobacco: Never Used  Vaping Use  . Vaping Use: Never used  Substance and Sexual Activity  . Alcohol use: Not Currently  . Drug use: Never  . Sexual activity: Not on file  Other Topics Concern  . Not on file  Social History Narrative  . Not on file   Social Determinants of Health   Financial Resource Strain:   . Difficulty of Paying Living Expenses:   Food Insecurity:   . Worried About Charity fundraiser in the Last Year:   . Arboriculturist in the Last Year:   Transportation Needs:   . Film/video editor (Medical):   Marland Kitchen Lack of Transportation (Non-Medical):   Physical Activity:   . Days of Exercise per Week:   . Minutes of Exercise  per Session:   Stress:   . Feeling of Stress :   Social Connections:   . Frequency of Communication with Friends and Family:   . Frequency of Social Gatherings with Friends and Family:   . Attends Religious Services:   . Active Member of Clubs or Organizations:   . Attends Archivist Meetings:   Marland Kitchen Marital Status:   Intimate Partner Violence:   . Fear of Current or Ex-Partner:   . Emotionally Abused:   Marland Kitchen Physically Abused:   . Sexually Abused:     Family History: History reviewed. No pertinent family history.  Review of Systems: Review of Systems  Constitutional: Negative for chills and fever.  HENT: Negative for congestion and sore throat.   Respiratory: Negative for cough and shortness of breath.   Cardiovascular: Negative for chest pain and palpitations.  Gastrointestinal: Negative for abdominal pain, nausea and vomiting.  Musculoskeletal: Negative for joint pain and myalgias.  Skin: Negative for itching and rash.    Physical Exam: Vital Signs BP 136/83 (BP Location: Left Arm, Patient Position: Sitting, Cuff Size: Large)   Pulse 93   Temp 98.3 F (36.8 C) (Oral)   Ht 5' (1.524 m)   Wt 207 lb 6.4 oz (94.1 kg)   SpO2 98%   BMI 40.51 kg/m  Physical Exam Constitutional:      General: She is not in acute distress.    Appearance: Normal appearance. She is obese. She is not ill-appearing.  HENT:     Head: Normocephalic and atraumatic.  Eyes:     Extraocular Movements: Extraocular movements intact.  Cardiovascular:     Rate and Rhythm: Normal rate and regular rhythm.     Pulses: Normal pulses.     Heart sounds: Normal heart sounds.  Pulmonary:     Effort: Pulmonary effort is normal.     Breath sounds: Normal breath sounds. No wheezing, rhonchi or rales.  Abdominal:     General: Bowel sounds are normal.     Palpations: Abdomen is soft.  Musculoskeletal:        General: No swelling. Normal range of motion.     Cervical back: Normal range of motion.   Skin:    General: Skin is warm and dry.     Coloration: Skin is not pale.     Findings: No erythema or rash.  Neurological:     General: No focal deficit present.     Mental Status: She is alert and oriented to person, place, and time.  Psychiatric:        Mood and Affect: Mood normal.  Behavior: Behavior normal.        Thought Content: Thought content normal.        Judgment: Judgment normal.     Assessment/Plan:  Ms. Naeve scheduled for bilateral breast reduction - oncoplastic for left breast cancer with Dr. Marla Roe to be completed in conjunction with left breast lumpectomy with Dr. Marlou Starks.  Risks, benefits, and alternatives of procedure discussed, questions answered and consent obtained.    Smoking Status: Non-smoker ; Counseling Given? N/A Last Mammogram: MRI 04/20/20; Results: Right breast: no evidence of malignancy. Left Breast: 4.8 cm subtle linear non-mass enhancement.  Caprini Score: 8 High; Risk Factors include: 69 yr-old female, hx cancer, visible varicose veins, BMI > 25, and length of planned surgery. Recommendation for mechanical and pharmacological prophylaxis during surgery. Encourage early ambulation.   Pictures obtained: 04/13/20  Post-op Rx sent to pharmacy: None - Patient has Rx from when surgery was scheduled previously. Norco, Zofran, & Keflex.  Patient was provided with the breast reduction risks and General Surgical Risk consent document and Pain Medication Agreement prior to their appointment.  They had adequate time to read through the risk consent documents and Pain Medication Agreement. We also discussed them in person together during this preop appointment. All of their questions were answered to their satisfaction.  Recommended calling if they have any further questions.  Risk consent form and Pain Medication Agreement to be scanned into patient's chart.  The risk that can be encountered with breast reduction were discussed and include the following  but not limited to these:  Breast asymmetry, fluid accumulation, firmness of the breast, inability to breast feed, loss of nipple or areola, skin loss, decrease or no nipple sensation, fat necrosis of the breast tissue, bleeding, infection, healing delay.  There are risks of anesthesia, changes to skin sensation and injury to nerves or blood vessels.  The muscle can be temporarily or permanently injured.  You may have an allergic reaction to tape, suture, glue, blood products which can result in skin discoloration, swelling, pain, skin lesions, poor healing.  Any of these can lead to the need for revisonal surgery or stage procedures.  A reduction has potential to interfere with diagnostic procedures.  Nipple or breast piercing can increase risks of infection.  This procedure is best done when the breast is fully developed.  Changes in the breast will continue to occur over time.  Pregnancy can alter the outcomes of previous breast reduction surgery, weight gain and weigh loss can also effect the long term appearance.   Electronically signed by: Threasa Heads, PA-C 06/29/2020 1:25 PM

## 2020-06-27 NOTE — Progress Notes (Signed)
ICD-10-CM   1. Ductal carcinoma in situ (DCIS) of left breast  D05.12   2. Symptomatic mammary hypertrophy  N62       Patient ID: Deanna Potter, female    DOB: June 22, 1951, 69 y.o.   MRN: [id number]   History of Present Illness: Deanna Potter is a 69 y.o.  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  She presents for preoperative evaluation for upcoming procedure, bilateral breast reduction - oncoplastic for left breast cancer with Dr. Marla Roe in conjunction with left breast lumpectomy with Dr. Marlou Starks, scheduled for 07/19/20.  Summary from previous visit: Patient was diagnosed with left-side ductal carcinoma in situ by routine mammogram. She is scheduled for a partial mastectomy of the left breast followed by radation. She has a history of extremely large breasts causing neck and back pain symptoms. The Sternal to Nipple distance on the right is 34 cm and the left is 33 cm. The IMF distance is 16 cm. She is 5\' 1"  tall and weighs 203 lbs. Per-op bra size is 40 DD. She would like to be a C cup. The estimated excess breast tissue to removed at time of surgery is 600 g from each side.  She will have radiation  PMH Significant for: Anxiety, cardiomegaly (pt reports no issues related to this), HTN (well controlled)  The patient has not had problems with anesthesia.   Past Medical History: Allergies: No Known Allergies  Current Medications:  Current Outpatient Medications:  .  ALPRAZolam (XANAX) 0.25 MG tablet, Take 1 tablet (0.25 mg total) by mouth at bedtime as needed for anxiety., Disp: 6 tablet, Rfl: 0 .  amLODipine (NORVASC) 10 MG tablet, Take 10 mg by mouth at bedtime. , Disp: , Rfl:  .  B Complex Vitamins (VITAMIN B COMPLEX PO), Take 1 tablet by mouth daily., Disp: , Rfl:  .  Cholecalciferol (DIALYVITE VITAMIN D 5000 PO), Take 5,000 mg by mouth daily., Disp: , Rfl:  .  Cod Liver Oil CAPS, Take 1 capsule by mouth daily. Plus A&D, Disp: , Rfl:  .   escitalopram (LEXAPRO) 20 MG tablet, Take 20 mg by mouth daily., Disp: , Rfl:  .  gabapentin (NEURONTIN) 100 MG capsule, Take 100 mg by mouth 3 (three) times daily., Disp: , Rfl:  .  latanoprost (XALATAN) 0.005 % ophthalmic solution, Place 1 drop into both eyes 2 (two) times daily. , Disp: , Rfl:  .  Magnesium 250 MG TABS, Take 250 mg by mouth daily., Disp: , Rfl:  .  Multiple Vitamin (MULTIVITAMIN) capsule, Take 1 capsule by mouth daily. Centrum Silver, Disp: , Rfl:  .  ondansetron (ZOFRAN) 4 MG tablet, Take 1 tablet (4 mg total) by mouth every 8 (eight) hours as needed for nausea or vomiting., Disp: 20 tablet, Rfl: 0 .  traZODone (DESYREL) 100 MG tablet, Take 100 mg by mouth at bedtime. , Disp: , Rfl:   Past Medical Problems: Past Medical History:  Diagnosis Date  . Anxiety   . Cardiomegaly 03/13/2019  . HTN (hypertension)     Past Surgical History: Past Surgical History:  Procedure Laterality Date  . PARTIAL HYSTERECTOMY      Social History: Social History   Socioeconomic History  . Marital status: Widowed    Spouse name: Not on file  . Number of children: 2  . Years of education: Not on file  . Highest education level: Not on file  Occupational History  .  Not on file  Tobacco Use  . Smoking status: Never Smoker  . Smokeless tobacco: Never Used  Vaping Use  . Vaping Use: Never used  Substance and Sexual Activity  . Alcohol use: Not Currently  . Drug use: Never  . Sexual activity: Not on file  Other Topics Concern  . Not on file  Social History Narrative  . Not on file   Social Determinants of Health   Financial Resource Strain:   . Difficulty of Paying Living Expenses:   Food Insecurity:   . Worried About Charity fundraiser in the Last Year:   . Arboriculturist in the Last Year:   Transportation Needs:   . Film/video editor (Medical):   Marland Kitchen Lack of Transportation (Non-Medical):   Physical Activity:   . Days of Exercise per Week:   . Minutes of Exercise  per Session:   Stress:   . Feeling of Stress :   Social Connections:   . Frequency of Communication with Friends and Family:   . Frequency of Social Gatherings with Friends and Family:   . Attends Religious Services:   . Active Member of Clubs or Organizations:   . Attends Archivist Meetings:   Marland Kitchen Marital Status:   Intimate Partner Violence:   . Fear of Current or Ex-Partner:   . Emotionally Abused:   Marland Kitchen Physically Abused:   . Sexually Abused:     Family History: History reviewed. No pertinent family history.  Review of Systems: Review of Systems  Constitutional: Negative for chills and fever.  HENT: Negative for congestion and sore throat.   Respiratory: Negative for cough and shortness of breath.   Cardiovascular: Negative for chest pain and palpitations.  Gastrointestinal: Negative for abdominal pain, nausea and vomiting.  Musculoskeletal: Negative for joint pain and myalgias.  Skin: Negative for itching and rash.    Physical Exam: Vital Signs BP 136/83 (BP Location: Left Arm, Patient Position: Sitting, Cuff Size: Large)   Pulse 93   Temp 98.3 F (36.8 C) (Oral)   Ht 5' (1.524 m)   Wt 207 lb 6.4 oz (94.1 kg)   SpO2 98%   BMI 40.51 kg/m  Physical Exam Constitutional:      General: She is not in acute distress.    Appearance: Normal appearance. She is obese. She is not ill-appearing.  HENT:     Head: Normocephalic and atraumatic.  Eyes:     Extraocular Movements: Extraocular movements intact.  Cardiovascular:     Rate and Rhythm: Normal rate and regular rhythm.     Pulses: Normal pulses.     Heart sounds: Normal heart sounds.  Pulmonary:     Effort: Pulmonary effort is normal.     Breath sounds: Normal breath sounds. No wheezing, rhonchi or rales.  Abdominal:     General: Bowel sounds are normal.     Palpations: Abdomen is soft.  Musculoskeletal:        General: No swelling. Normal range of motion.     Cervical back: Normal range of motion.    Skin:    General: Skin is warm and dry.     Coloration: Skin is not pale.     Findings: No erythema or rash.  Neurological:     General: No focal deficit present.     Mental Status: She is alert and oriented to person, place, and time.  Psychiatric:        Mood and Affect: Mood normal.  Behavior: Behavior normal.        Thought Content: Thought content normal.        Judgment: Judgment normal.     Assessment/Plan:  Ms. Rucci scheduled for bilateral breast reduction - oncoplastic for left breast cancer with Dr. Marla Roe to be completed in conjunction with left breast lumpectomy with Dr. Marlou Starks.  Risks, benefits, and alternatives of procedure discussed, questions answered and consent obtained.    Smoking Status: Non-smoker ; Counseling Given? N/A Last Mammogram: MRI 04/20/20; Results: Right breast: no evidence of malignancy. Left Breast: 4.8 cm subtle linear non-mass enhancement.  Caprini Score: 8 High; Risk Factors include: 69 yr-old female, hx cancer, visible varicose veins, BMI > 25, and length of planned surgery. Recommendation for mechanical and pharmacological prophylaxis during surgery. Encourage early ambulation.   Pictures obtained: 04/13/20  Post-op Rx sent to pharmacy: None - Patient has Rx from when surgery was scheduled previously. Norco, Zofran, & Keflex.  Patient was provided with the breast reduction risks and General Surgical Risk consent document and Pain Medication Agreement prior to their appointment.  They had adequate time to read through the risk consent documents and Pain Medication Agreement. We also discussed them in person together during this preop appointment. All of their questions were answered to their satisfaction.  Recommended calling if they have any further questions.  Risk consent form and Pain Medication Agreement to be scanned into patient's chart.  The risk that can be encountered with breast reduction were discussed and include the following  but not limited to these:  Breast asymmetry, fluid accumulation, firmness of the breast, inability to breast feed, loss of nipple or areola, skin loss, decrease or no nipple sensation, fat necrosis of the breast tissue, bleeding, infection, healing delay.  There are risks of anesthesia, changes to skin sensation and injury to nerves or blood vessels.  The muscle can be temporarily or permanently injured.  You may have an allergic reaction to tape, suture, glue, blood products which can result in skin discoloration, swelling, pain, skin lesions, poor healing.  Any of these can lead to the need for revisonal surgery or stage procedures.  A reduction has potential to interfere with diagnostic procedures.  Nipple or breast piercing can increase risks of infection.  This procedure is best done when the breast is fully developed.  Changes in the breast will continue to occur over time.  Pregnancy can alter the outcomes of previous breast reduction surgery, weight gain and weigh loss can also effect the long term appearance.   Electronically signed by: Threasa Heads, PA-C 06/29/2020 1:25 PM

## 2020-06-29 ENCOUNTER — Other Ambulatory Visit: Payer: Self-pay

## 2020-06-29 ENCOUNTER — Ambulatory Visit (INDEPENDENT_AMBULATORY_CARE_PROVIDER_SITE_OTHER): Payer: Medicare Other | Admitting: Plastic Surgery

## 2020-06-29 ENCOUNTER — Encounter: Payer: Self-pay | Admitting: Plastic Surgery

## 2020-06-29 VITALS — BP 136/83 | HR 93 | Temp 98.3°F | Ht 60.0 in | Wt 207.4 lb

## 2020-06-29 DIAGNOSIS — N62 Hypertrophy of breast: Secondary | ICD-10-CM

## 2020-06-29 DIAGNOSIS — D0512 Intraductal carcinoma in situ of left breast: Secondary | ICD-10-CM

## 2020-07-02 ENCOUNTER — Encounter: Payer: Medicare Other | Admitting: Plastic Surgery

## 2020-07-12 ENCOUNTER — Other Ambulatory Visit: Payer: Self-pay

## 2020-07-12 ENCOUNTER — Encounter (HOSPITAL_COMMUNITY): Payer: Self-pay

## 2020-07-12 ENCOUNTER — Encounter (HOSPITAL_COMMUNITY)
Admission: RE | Admit: 2020-07-12 | Discharge: 2020-07-12 | Disposition: A | Discharge status: Home / Self Care | Payer: Medicare Other | Source: Ambulatory Visit | Attending: General Surgery | Admitting: General Surgery

## 2020-07-12 DIAGNOSIS — Z01812 Encounter for preprocedural laboratory examination: Secondary | ICD-10-CM | POA: Diagnosis not present

## 2020-07-12 LAB — CBC
HCT: 42.3 % (ref 36.0–46.0)
Hemoglobin: 13.7 g/dL (ref 12.0–15.0)
MCH: 25.2 pg — ABNORMAL LOW (ref 26.0–34.0)
MCHC: 32.4 g/dL (ref 30.0–36.0)
MCV: 77.9 fL — ABNORMAL LOW (ref 80.0–100.0)
Platelets: 346 10*3/uL (ref 150–400)
RBC: 5.43 MIL/uL — ABNORMAL HIGH (ref 3.87–5.11)
RDW: 14.6 % (ref 11.5–15.5)
WBC: 11.6 10*3/uL — ABNORMAL HIGH (ref 4.0–10.5)
nRBC: 0 % (ref 0.0–0.2)

## 2020-07-12 LAB — BASIC METABOLIC PANEL
Anion gap: 8 (ref 5–15)
BUN: 12 mg/dL (ref 8–23)
CO2: 25 mmol/L (ref 22–32)
Calcium: 10.1 mg/dL (ref 8.9–10.3)
Chloride: 104 mmol/L (ref 98–111)
Creatinine, Ser: 1.11 mg/dL — ABNORMAL HIGH (ref 0.44–1.00)
GFR calc Af Amer: 59 mL/min — ABNORMAL LOW (ref >60)
GFR calc non Af Amer: 51 mL/min — ABNORMAL LOW (ref >60)
Glucose, Bld: 98 mg/dL (ref 70–99)
Potassium: 4.2 mmol/L (ref 3.5–5.1)
Sodium: 137 mmol/L (ref 135–145)

## 2020-07-12 NOTE — Pre-Procedure Instructions (Addendum)
[facility], [address] [place] [address] Phone: [phone] Fax: [phone]  [facility], [address], [address], [address] [place] [address] Phone: [phone] Fax: [facility]), [place] [address] [place]) [place] [address] Phone: [phone] Fax: [phone]      Your procedure is scheduled on Monday August 23rd.  Report to [facility] Main Entrance "A" at 9:00 A.M., and check in at the Admitting office.  Call this number if you have problems the morning of surgery:  [phone]  Call [phone] if you have any questions prior to your surgery date Monday-Friday 8am-4pm    Remember:  Do not eat after midnight the night before your surgery  You may drink clear liquids until 8:00 the morning of your surgery.   Clear liquids allowed are: Water, Non-Citrus Juices (without pulp), Carbonated Beverages, Clear Tea, Black Coffee Only, and Gatorade    Take these medicines the morning of surgery with A SIP OF WATER  amLODipine (NORVASC) escitalopram (LEXAPRO) gabapentin (NEURONTIN) latanoprost (XALATAN)  ALPRAZolam (XANAX)-if needed ondansetron (ZOFRAN) - if needed   As of today, STOP taking any Aspirin (unless otherwise instructed by your surgeon) Aleve, Naproxen, Ibuprofen, Motrin, Advil, Goody's, BC's, all herbal medications, fish oil, and all vitamins.                      Do not wear jewelry, make up, or nail polish            Do not wear lotions, powders, perfumes/colognes, or deodorant.            Do not shave 48 hours prior to surgery.  Men may shave face and neck.            Do not bring valuables to the hospital.            [facility] is not responsible for any belongings or valuables.  Do NOT Smoke (Tobacco/Vaping) or drink Alcohol 24 hours prior to your  procedure If you use a CPAP at night, you may bring all equipment for your overnight stay.   Contacts, glasses, dentures or bridgework may not be worn into surgery.      For patients admitted to the hospital, discharge time will be determined by your treatment team.   Patients discharged the day of surgery will not be allowed to drive home, and someone needs to stay with them for 24 hours.    Special instructions:   [facility]- Preparing For Surgery  Before surgery, you can play an important role. Because skin is not sterile, your skin needs to be as free of germs as possible. You can reduce the number of germs on your skin by washing with CHG (chlorahexidine gluconate) Soap before surgery.  CHG is an antiseptic cleaner which kills germs and bonds with the skin to continue killing germs even after washing.    Oral Hygiene is also important to reduce your risk of infection.  Remember - BRUSH YOUR TEETH THE MORNING OF SURGERY WITH YOUR REGULAR TOOTHPASTE  Please do not use if you have an allergy to CHG or antibacterial soaps. If your skin becomes reddened/irritated stop using the CHG.  Do not shave (including legs and underarms) for at least 48 hours prior to first CHG shower. It is OK to  shave your face.  Please follow these instructions carefully.   1. Shower the NIGHT BEFORE SURGERY and the MORNING OF SURGERY with CHG Soap.   2. If you chose to wash your hair, wash your hair first as usual with your normal shampoo.  3. After you shampoo, rinse your hair and body thoroughly to remove the shampoo.  4. Use CHG as you would any other liquid soap. You can apply CHG directly to the skin and wash gently with a scrungie or a clean washcloth.   5. Apply the CHG Soap to your body ONLY FROM THE NECK DOWN.  Do not use on open wounds or open sores. Avoid contact with your eyes, ears, mouth and genitals (private parts). Wash Face and genitals (private parts)  with your normal soap.   6. Wash  thoroughly, paying special attention to the area where your surgery will be performed.  7. Thoroughly rinse your body with warm water from the neck down.  8. DO NOT shower/wash with your normal soap after using and rinsing off the CHG Soap.  9. Pat yourself dry with a CLEAN TOWEL.  10. Wear CLEAN PAJAMAS to bed the night before surgery  11. Place CLEAN SHEETS on your bed the night of your first shower and DO NOT SLEEP WITH PETS.   Day of Surgery: Wear Clean/Comfortable clothing the morning of surgery Do not apply any deodorants/lotions.   Remember to brush your teeth WITH YOUR REGULAR TOOTHPASTE.   Please read over the following fact sheets that you were given.

## 2020-07-12 NOTE — Progress Notes (Addendum)
PCP - Jaynee Eagles, PA-C Cardiologist - Dr. Einar Gip  Chest x-ray - N/A EKG - 10/27/20 Stress Test - denies ECHO - 10/28/19 Cardiac Cath - denies  Sleep Study - h/o OSA  CPAP - uses QHS, does not know pressure settings   Aspirin Instructions: Patient instructed to hold all Aspirin, NSAID's, herbal medications, fish oil and vitamins 7 days prior to surgery.  *breast seed scheduled for 0730 DOS, pt instructed to come to the hospital following seed appointment  Anesthesia review: breast seed  Covid test: 07/17/20 at at [facility]. Pt instructed to remain in their car. Educated on Transport planner until [employer].   Patient denies shortness of breath, fever, cough and chest pain at PAT appointment   Patient verbalized understanding of instructions that were given to them at the PAT appointment. Patient was also instructed that they will need to review over the PAT instructions again at home before surgery.

## 2020-07-14 ENCOUNTER — Encounter: Payer: Self-pay | Admitting: Cardiology

## 2020-07-14 ENCOUNTER — Other Ambulatory Visit: Payer: Self-pay | Admitting: Student

## 2020-07-14 DIAGNOSIS — I517 Cardiomegaly: Secondary | ICD-10-CM

## 2020-07-14 NOTE — Progress Notes (Signed)
Patient preparing to undergo surgery for breast cancer and need echo prior to surgery.

## 2020-07-15 ENCOUNTER — Other Ambulatory Visit (HOSPITAL_COMMUNITY): Payer: Medicare Other

## 2020-07-16 NOTE — Progress Notes (Signed)
Anesthesia Chart Review:  Follows with cardiologist Dr. Einar Gip for history of chronic palpitations, hypertension, OSA on CPAP, cardiomegaly.  Last seen by Dr. Einar Gip 10/28/2019, per note, "She is presently doing well and essentially remains asymptomatic except for chronic palpitations.  These are very brief and no other associated symptoms.  There is no history of sudden cardiac death. EKG is markedly abnormal.  She is severe LVH although her blood pressure is very well controlled with minimal medications.  Cannot exclude hypertrophic cardiomyopathy.  I would like to obtain an echocardiogram to establish a baseline.  She'll make an attempt to get records from [facility] as well."  Patient has not yet had echocardiogram.  There was some debate regarding whether or not she needed to have this done prior to surgery.  Dr. Einar Gip was contacted and per his office stated, "Per Dr Einar Gip if the patient is only having breast surgery she DOES NOT need to have an echocardiogram. She will need to have an echocardiogram if she has to have chemo afterwards."    Dr. Einar Gip had also previously cleared patient for surgery in letter dated 06/14/2020, "[facility] is at low risk, from a cardiac standpoint, for her upcoming procedure: breast lumpectomy.  It is ok to proceed without further cardiac testing. If applicable can hold/ASA for 7  day(s) prior to procedure and re-start 2-3  days post procedure."  Preop labs reviewed, unremarkable.  EKG 10/28/2019: Sinus rhythm with borderline first-degree AV block at the rate of 69 bpm, left atrial enlargement, marked high-voltage and IVCD, LVH with repolarization abnormality with ST-T wave changes in the lateral leads. Normal QT interval.   Deanna Caldwell, PA-C [facility] Short Stay Center/Anesthesiology Phone [phone] 07/16/2020 1:46 PM

## 2020-07-16 NOTE — Anesthesia Preprocedure Evaluation (Addendum)
Anesthesia Evaluation  Patient identified by MRN, date of birth, ID band Patient awake    Reviewed: Allergy & Precautions, NPO status , Patient's Chart, lab work & pertinent test results  History of Anesthesia Complications Negative for: history of anesthetic complications  Airway Mallampati: II  TM Distance: >3 FB Neck ROM: Full    Dental  (+) Dental Advisory Given, Partial Upper, Partial Lower   Pulmonary sleep apnea and Continuous Positive Airway Pressure Ventilation ,    Pulmonary exam normal        Cardiovascular hypertension, Pt. on medications Normal cardiovascular exam   Hx severe LVH, ?HOCM See PAT note     Neuro/Psych PSYCHIATRIC DISORDERS Anxiety negative neurological ROS     GI/Hepatic negative GI ROS, Neg liver ROS,   Endo/Other   Obesity   Renal/GU negative Renal ROS     Musculoskeletal negative musculoskeletal ROS (+)   Abdominal   Peds  Hematology negative hematology ROS (+)   Anesthesia Other Findings Covid test negative   Reproductive/Obstetrics                           Anesthesia Physical Anesthesia Plan  ASA: III  Anesthesia Plan: General   Post-op Pain Management:    Induction: Intravenous  PONV Risk Score and Plan: 4 or greater and Treatment may vary due to age or medical condition, Ondansetron, Dexamethasone and Scopolamine patch - Pre-op  Airway Management Planned: Oral ETT  Additional Equipment: None  Intra-op Plan:   Post-operative Plan: Extubation in OR  Informed Consent: I have reviewed the patients History and Physical, chart, labs and discussed the procedure including the risks, benefits and alternatives for the proposed anesthesia with the patient or authorized representative who has indicated his/her understanding and acceptance.     Dental advisory given  Plan Discussed with: CRNA and Anesthesiologist  Anesthesia Plan Comments:        Anesthesia Quick Evaluation

## 2020-07-17 ENCOUNTER — Other Ambulatory Visit (HOSPITAL_COMMUNITY)
Admission: RE | Admit: 2020-07-17 | Discharge: 2020-07-17 | Disposition: A | Discharge status: Home / Self Care | Payer: Medicare Other | Source: Ambulatory Visit | Attending: General Surgery | Admitting: General Surgery

## 2020-07-17 DIAGNOSIS — Z01812 Encounter for preprocedural laboratory examination: Secondary | ICD-10-CM | POA: Insufficient documentation

## 2020-07-17 DIAGNOSIS — Z20822 Contact with and (suspected) exposure to covid-19: Secondary | ICD-10-CM | POA: Diagnosis not present

## 2020-07-17 LAB — SARS CORONAVIRUS 2 (TAT 6-24 HRS): SARS Coronavirus 2: NEGATIVE

## 2020-07-19 ENCOUNTER — Other Ambulatory Visit: Payer: Self-pay

## 2020-07-19 ENCOUNTER — Ambulatory Visit
Admission: RE | Admit: 2020-07-19 | Discharge: 2020-07-19 | Disposition: A | Discharge status: Home / Self Care | Payer: Medicare Other | Source: Ambulatory Visit | Attending: General Surgery | Admitting: General Surgery

## 2020-07-19 ENCOUNTER — Encounter (HOSPITAL_COMMUNITY): Payer: Self-pay | Admitting: General Surgery

## 2020-07-19 ENCOUNTER — Ambulatory Visit (HOSPITAL_COMMUNITY): Payer: Medicare Other | Admitting: Physician Assistant

## 2020-07-19 ENCOUNTER — Encounter (HOSPITAL_COMMUNITY)
Admission: RE | Disposition: A | Discharge status: Home / Self Care | Payer: Self-pay | Source: Home / Self Care | Attending: General Surgery

## 2020-07-19 ENCOUNTER — Ambulatory Visit (HOSPITAL_COMMUNITY)
Admission: RE | Admit: 2020-07-19 | Discharge: 2020-07-19 | Disposition: A | Discharge status: Home / Self Care | Payer: Medicare Other | Attending: General Surgery | Admitting: General Surgery

## 2020-07-19 DIAGNOSIS — N6022 Fibroadenosis of left breast: Secondary | ICD-10-CM | POA: Diagnosis not present

## 2020-07-19 DIAGNOSIS — Z79899 Other long term (current) drug therapy: Secondary | ICD-10-CM | POA: Diagnosis not present

## 2020-07-19 DIAGNOSIS — N651 Disproportion of reconstructed breast: Secondary | ICD-10-CM

## 2020-07-19 DIAGNOSIS — D0512 Intraductal carcinoma in situ of left breast: Secondary | ICD-10-CM

## 2020-07-19 DIAGNOSIS — M542 Cervicalgia: Secondary | ICD-10-CM | POA: Insufficient documentation

## 2020-07-19 DIAGNOSIS — G4730 Sleep apnea, unspecified: Secondary | ICD-10-CM | POA: Insufficient documentation

## 2020-07-19 DIAGNOSIS — Z90710 Acquired absence of both cervix and uterus: Secondary | ICD-10-CM | POA: Diagnosis not present

## 2020-07-19 DIAGNOSIS — M549 Dorsalgia, unspecified: Secondary | ICD-10-CM | POA: Insufficient documentation

## 2020-07-19 DIAGNOSIS — N6489 Other specified disorders of breast: Secondary | ICD-10-CM | POA: Diagnosis not present

## 2020-07-19 DIAGNOSIS — I10 Essential (primary) hypertension: Secondary | ICD-10-CM | POA: Diagnosis not present

## 2020-07-19 DIAGNOSIS — D242 Benign neoplasm of left breast: Secondary | ICD-10-CM | POA: Diagnosis not present

## 2020-07-19 DIAGNOSIS — Z9012 Acquired absence of left breast and nipple: Secondary | ICD-10-CM

## 2020-07-19 DIAGNOSIS — E669 Obesity, unspecified: Secondary | ICD-10-CM | POA: Insufficient documentation

## 2020-07-19 DIAGNOSIS — I517 Cardiomegaly: Secondary | ICD-10-CM | POA: Diagnosis not present

## 2020-07-19 DIAGNOSIS — Z6839 Body mass index (BMI) 39.0-39.9, adult: Secondary | ICD-10-CM | POA: Diagnosis not present

## 2020-07-19 DIAGNOSIS — N62 Hypertrophy of breast: Secondary | ICD-10-CM | POA: Diagnosis not present

## 2020-07-19 DIAGNOSIS — L821 Other seborrheic keratosis: Secondary | ICD-10-CM | POA: Diagnosis not present

## 2020-07-19 DIAGNOSIS — F419 Anxiety disorder, unspecified: Secondary | ICD-10-CM | POA: Insufficient documentation

## 2020-07-19 HISTORY — PX: BREAST LUMPECTOMY WITH RADIOACTIVE SEED LOCALIZATION: SHX6424

## 2020-07-19 HISTORY — PX: BREAST REDUCTION SURGERY: SHX8

## 2020-07-19 SURGERY — BREAST LUMPECTOMY WITH RADIOACTIVE SEED LOCALIZATION
Anesthesia: General | Site: Breast | Laterality: Left

## 2020-07-19 MED ORDER — ONDANSETRON HCL 4 MG/2ML IJ SOLN
INTRAMUSCULAR | Status: AC
  Filled 2020-07-19: qty 2

## 2020-07-19 MED ORDER — OXYCODONE HCL 5 MG PO TABS: 5.0000 mg | ORAL_TABLET | Freq: Once | ORAL | Status: DC | PRN

## 2020-07-19 MED ORDER — ACETAMINOPHEN 650 MG RE SUPP: 650.0000 mg | RECTAL | Status: DC | PRN

## 2020-07-19 MED ORDER — CEFAZOLIN SODIUM-DEXTROSE 2-4 GM/100ML-% IV SOLN
2.0000 g | INTRAVENOUS | Status: DC
Start: 2020-07-19 — End: 2020-07-19

## 2020-07-19 MED ORDER — LIDOCAINE 2% (20 MG/ML) 5 ML SYRINGE
INTRAMUSCULAR | Status: DC | PRN
  Administered 2020-07-19: 60 mg via INTRAVENOUS

## 2020-07-19 MED ORDER — CHLORHEXIDINE GLUCONATE 0.12 % MT SOLN: 15.0000 mL | Freq: Once | OROMUCOSAL | Status: AC

## 2020-07-19 MED ORDER — MIDAZOLAM HCL 2 MG/2ML IJ SOLN
INTRAMUSCULAR | Status: DC | PRN
  Administered 2020-07-19 (×2): 1 mg via INTRAVENOUS

## 2020-07-19 MED ORDER — PROPOFOL 10 MG/ML IV BOLUS
INTRAVENOUS | Status: AC
  Filled 2020-07-19: qty 20

## 2020-07-19 MED ORDER — BUPIVACAINE HCL (PF) 0.25 % IJ SOLN
INTRAMUSCULAR | Status: DC | PRN
  Administered 2020-07-19: 20 mL
  Administered 2020-07-19: 10 mL

## 2020-07-19 MED ORDER — OXYCODONE HCL 5 MG PO TABS: 5.0000 mg | ORAL_TABLET | ORAL | Status: DC | PRN

## 2020-07-19 MED ORDER — CEFAZOLIN SODIUM-DEXTROSE 2-4 GM/100ML-% IV SOLN
INTRAVENOUS | Status: AC
  Filled 2020-07-19: qty 100

## 2020-07-19 MED ORDER — CHLORHEXIDINE GLUCONATE 0.12 % MT SOLN
OROMUCOSAL | Status: AC
  Administered 2020-07-19: 15 mL via OROMUCOSAL
  Filled 2020-07-19: qty 15

## 2020-07-19 MED ORDER — GABAPENTIN 300 MG PO CAPS
300.0000 mg | ORAL_CAPSULE | ORAL | Status: AC
  Administered 2020-07-19: 300 mg via ORAL

## 2020-07-19 MED ORDER — FENTANYL CITRATE (PF) 100 MCG/2ML IJ SOLN
25.0000 ug | INTRAMUSCULAR | Status: DC | PRN
  Administered 2020-07-19: 50 ug via INTRAVENOUS

## 2020-07-19 MED ORDER — SODIUM CHLORIDE 0.9 % IV SOLN: 250.0000 mL | INTRAVENOUS | Status: DC | PRN

## 2020-07-19 MED ORDER — FENTANYL CITRATE (PF) 100 MCG/2ML IJ SOLN
INTRAMUSCULAR | Status: DC
Start: 2020-07-19 — End: 2020-07-19
  Filled 2020-07-19: qty 2

## 2020-07-19 MED ORDER — DEXAMETHASONE SODIUM PHOSPHATE 10 MG/ML IJ SOLN
INTRAMUSCULAR | Status: DC | PRN
  Administered 2020-07-19: 10 mg via INTRAVENOUS

## 2020-07-19 MED ORDER — FENTANYL CITRATE (PF) 250 MCG/5ML IJ SOLN
INTRAMUSCULAR | Status: DC | PRN
Start: 2020-07-19 — End: 2020-07-19
  Administered 2020-07-19 (×5): 50 ug via INTRAVENOUS

## 2020-07-19 MED ORDER — ACETAMINOPHEN 500 MG PO TABS
1000.0000 mg | ORAL_TABLET | ORAL | Status: AC
  Administered 2020-07-19: 1000 mg via ORAL

## 2020-07-19 MED ORDER — ONDANSETRON HCL 4 MG/2ML IJ SOLN: 4.0000 mg | Freq: Once | INTRAMUSCULAR | Status: DC | PRN

## 2020-07-19 MED ORDER — ORAL CARE MOUTH RINSE: 15.0000 mL | Freq: Once | OROMUCOSAL | Status: AC

## 2020-07-19 MED ORDER — OXYCODONE HCL 5 MG/5ML PO SOLN: 5.0000 mg | Freq: Once | ORAL | Status: DC | PRN

## 2020-07-19 MED ORDER — BUPIVACAINE HCL (PF) 0.25 % IJ SOLN
INTRAMUSCULAR | Status: AC
  Filled 2020-07-19: qty 30

## 2020-07-19 MED ORDER — GABAPENTIN 300 MG PO CAPS
ORAL_CAPSULE | ORAL | Status: AC
  Filled 2020-07-19: qty 1

## 2020-07-19 MED ORDER — CELECOXIB 200 MG PO CAPS: 200.0000 mg | ORAL_CAPSULE | ORAL | Status: AC

## 2020-07-19 MED ORDER — DEXAMETHASONE SODIUM PHOSPHATE 10 MG/ML IJ SOLN
INTRAMUSCULAR | Status: AC
  Filled 2020-07-19: qty 1

## 2020-07-19 MED ORDER — MIDAZOLAM HCL 2 MG/2ML IJ SOLN
INTRAMUSCULAR | Status: AC
  Filled 2020-07-19: qty 2

## 2020-07-19 MED ORDER — LIDOCAINE 2% (20 MG/ML) 5 ML SYRINGE
INTRAMUSCULAR | Status: AC
  Filled 2020-07-19: qty 5

## 2020-07-19 MED ORDER — CHLORHEXIDINE GLUCONATE CLOTH 2 % EX PADS: 6.0000 | MEDICATED_PAD | Freq: Once | CUTANEOUS | Status: DC

## 2020-07-19 MED ORDER — ONDANSETRON HCL 4 MG/2ML IJ SOLN
INTRAMUSCULAR | Status: DC | PRN
  Administered 2020-07-19: 4 mg via INTRAVENOUS

## 2020-07-19 MED ORDER — LIDOCAINE-EPINEPHRINE 1 %-1:100000 IJ SOLN
INTRAMUSCULAR | Status: AC
  Filled 2020-07-19: qty 1

## 2020-07-19 MED ORDER — SODIUM CHLORIDE 0.9% FLUSH: 3.0000 mL | Freq: Two times a day (BID) | INTRAVENOUS | Status: DC

## 2020-07-19 MED ORDER — FENTANYL CITRATE (PF) 100 MCG/2ML IJ SOLN: 25.0000 ug | INTRAMUSCULAR | Status: DC | PRN

## 2020-07-19 MED ORDER — ROCURONIUM BROMIDE 10 MG/ML (PF) SYRINGE
PREFILLED_SYRINGE | INTRAVENOUS | Status: DC | PRN
  Administered 2020-07-19: 70 mg via INTRAVENOUS

## 2020-07-19 MED ORDER — LIDOCAINE-EPINEPHRINE 1 %-1:100000 IJ SOLN
INTRAMUSCULAR | Status: DC | PRN
  Administered 2020-07-19: 20 mL

## 2020-07-19 MED ORDER — 0.9 % SODIUM CHLORIDE (POUR BTL) OPTIME
TOPICAL | Status: DC | PRN
  Administered 2020-07-19: 1000 mL

## 2020-07-19 MED ORDER — SUCCINYLCHOLINE CHLORIDE 200 MG/10ML IV SOSY
PREFILLED_SYRINGE | INTRAVENOUS | Status: AC
  Filled 2020-07-19: qty 10

## 2020-07-19 MED ORDER — CEFAZOLIN SODIUM-DEXTROSE 2-4 GM/100ML-% IV SOLN
2.0000 g | INTRAVENOUS | Status: AC
  Administered 2020-07-19: 2 g via INTRAVENOUS

## 2020-07-19 MED ORDER — ROCURONIUM BROMIDE 10 MG/ML (PF) SYRINGE
PREFILLED_SYRINGE | INTRAVENOUS | Status: AC
  Filled 2020-07-19: qty 10

## 2020-07-19 MED ORDER — ACETAMINOPHEN 325 MG PO TABS: 650.0000 mg | ORAL_TABLET | ORAL | Status: DC | PRN

## 2020-07-19 MED ORDER — ACETAMINOPHEN 500 MG PO TABS
ORAL_TABLET | ORAL | Status: AC
  Filled 2020-07-19: qty 2

## 2020-07-19 MED ORDER — HYDROCODONE-ACETAMINOPHEN 5-325 MG PO TABS: 1.0000 | ORAL_TABLET | Freq: Four times a day (QID) | ORAL | 0 refills | Status: DC | PRN

## 2020-07-19 MED ORDER — LACTATED RINGERS IV SOLN: INTRAVENOUS | Status: DC | PRN

## 2020-07-19 MED ORDER — SUGAMMADEX SODIUM 200 MG/2ML IV SOLN
INTRAVENOUS | Status: DC | PRN
  Administered 2020-07-19: 200 mg via INTRAVENOUS

## 2020-07-19 MED ORDER — CELECOXIB 200 MG PO CAPS
ORAL_CAPSULE | ORAL | Status: AC
  Administered 2020-07-19: 200 mg via ORAL
  Filled 2020-07-19: qty 1

## 2020-07-19 MED ORDER — SODIUM CHLORIDE 0.9% FLUSH: 3.0000 mL | INTRAVENOUS | Status: DC | PRN

## 2020-07-19 MED ORDER — PROPOFOL 10 MG/ML IV BOLUS
INTRAVENOUS | Status: DC | PRN
  Administered 2020-07-19: 150 mg via INTRAVENOUS

## 2020-07-19 MED ORDER — FENTANYL CITRATE (PF) 250 MCG/5ML IJ SOLN
INTRAMUSCULAR | Status: AC
  Filled 2020-07-19: qty 5

## 2020-07-19 SURGICAL SUPPLY — 68 items
APPLIER CLIP 9.375 MED OPEN (MISCELLANEOUS)
BAG DECANTER FOR FLEXI CONT (MISCELLANEOUS) ×4 IMPLANT
BINDER BREAST LRG (GAUZE/BANDAGES/DRESSINGS) IMPLANT
BINDER BREAST MEDIUM (GAUZE/BANDAGES/DRESSINGS) IMPLANT
BINDER BREAST XLRG (GAUZE/BANDAGES/DRESSINGS) IMPLANT
BINDER BREAST XXLRG (GAUZE/BANDAGES/DRESSINGS) ×2 IMPLANT
BIOPATCH RED 1 DISK 7.0 (GAUZE/BANDAGES/DRESSINGS) ×6 IMPLANT
BIOPATCH RED 1IN DISK 7.0MM (GAUZE/BANDAGES/DRESSINGS) ×2
BLADE HEX COATED 2.75 (ELECTRODE) ×4 IMPLANT
BLADE SURG 15 STRL LF DISP TIS (BLADE) ×4 IMPLANT
BLADE SURG 15 STRL SS (BLADE) ×4
BNDG GAUZE ELAST 4 BULKY (GAUZE/BANDAGES/DRESSINGS) ×8 IMPLANT
CANISTER SUCT 3000ML PPV (MISCELLANEOUS) ×8 IMPLANT
CHLORAPREP W/TINT 26 (MISCELLANEOUS) ×8 IMPLANT
CLIP APPLIE 9.375 MED OPEN (MISCELLANEOUS) IMPLANT
CLOSURE WOUND 1/2 X4 (GAUZE/BANDAGES/DRESSINGS) ×1
COVER PROBE W GEL 5X96 (DRAPES) ×4 IMPLANT
COVER SURGICAL LIGHT HANDLE (MISCELLANEOUS) ×4 IMPLANT
COVER WAND RF STERILE (DRAPES) ×4 IMPLANT
DECANTER SPIKE VIAL GLASS SM (MISCELLANEOUS) IMPLANT
DERMABOND ADVANCED (GAUZE/BANDAGES/DRESSINGS) ×6
DERMABOND ADVANCED .7 DNX12 (GAUZE/BANDAGES/DRESSINGS) ×6 IMPLANT
DEVICE DUBIN SPECIMEN MAMMOGRA (MISCELLANEOUS) ×4 IMPLANT
DRAIN CHANNEL 19F RND (DRAIN) IMPLANT
DRAPE CHEST BREAST 15X10 FENES (DRAPES) ×4 IMPLANT
DRAPE LAPAROSCOPIC ABDOMINAL (DRAPES) ×4 IMPLANT
DRSG AQUACEL AG ADV 3.5X 6 (GAUZE/BANDAGES/DRESSINGS) ×4 IMPLANT
DRSG PAD ABDOMINAL 8X10 ST (GAUZE/BANDAGES/DRESSINGS) ×8 IMPLANT
ELECT BLADE 4.0 EZ CLEAN MEGAD (MISCELLANEOUS) ×4
ELECT COATED BLADE 2.86 ST (ELECTRODE) ×4 IMPLANT
ELECT REM PT RETURN 9FT ADLT (ELECTROSURGICAL) ×8
ELECTRODE BLDE 4.0 EZ CLN MEGD (MISCELLANEOUS) ×2 IMPLANT
ELECTRODE REM PT RTRN 9FT ADLT (ELECTROSURGICAL) ×4 IMPLANT
EVACUATOR SILICONE 100CC (DRAIN) IMPLANT
GLOVE BIO SURGEON STRL SZ 6.5 (GLOVE) ×6 IMPLANT
GLOVE BIO SURGEON STRL SZ7.5 (GLOVE) ×8 IMPLANT
GLOVE BIO SURGEONS STRL SZ 6.5 (GLOVE) ×2
GOWN STRL REUS W/ TWL LRG LVL3 (GOWN DISPOSABLE) ×8 IMPLANT
GOWN STRL REUS W/TWL LRG LVL3 (GOWN DISPOSABLE) ×8
KIT BASIN OR (CUSTOM PROCEDURE TRAY) ×4 IMPLANT
KIT MARKER MARGIN INK (KITS) ×4 IMPLANT
LIGHT WAVEGUIDE WIDE FLAT (MISCELLANEOUS) IMPLANT
NDL HYPO 25GX1X1/2 BEV (NEEDLE) ×2 IMPLANT
NDL SPNL 22GX3.5 QUINCKE BK (NEEDLE) ×2 IMPLANT
NEEDLE HYPO 25GX1X1/2 BEV (NEEDLE) ×4 IMPLANT
NEEDLE SPNL 22GX3.5 QUINCKE BK (NEEDLE) ×4 IMPLANT
NS IRRIG 1000ML POUR BTL (IV SOLUTION) ×8 IMPLANT
PACK GENERAL/GYN (CUSTOM PROCEDURE TRAY) ×8 IMPLANT
SPONGE LAP 18X18 RF (DISPOSABLE) ×8 IMPLANT
STRIP CLOSURE SKIN 1/2X4 (GAUZE/BANDAGES/DRESSINGS) ×1 IMPLANT
SUT MNCRL AB 3-0 PS2 18 (SUTURE) ×8 IMPLANT
SUT MNCRL AB 4-0 PS2 18 (SUTURE) ×18 IMPLANT
SUT MON AB 5-0 PS2 18 (SUTURE) ×12 IMPLANT
SUT PDS AB 2-0 CT2 27 (SUTURE) IMPLANT
SUT SILK 2 0 SH (SUTURE) IMPLANT
SUT SILK 3 0 PS 1 (SUTURE) IMPLANT
SUT VIC AB 3-0 SH 18 (SUTURE) ×4 IMPLANT
SUT VIC AB 3-0 SH 27 (SUTURE) ×4
SUT VIC AB 3-0 SH 27X BRD (SUTURE) ×4 IMPLANT
SUT VICRYL 4-0 PS2 18IN ABS (SUTURE) ×8 IMPLANT
SYR 50ML LL SCALE MARK (SYRINGE) IMPLANT
SYR CONTROL 10ML LL (SYRINGE) ×4 IMPLANT
TOWEL GREEN STERILE (TOWEL DISPOSABLE) ×4 IMPLANT
TOWEL GREEN STERILE FF (TOWEL DISPOSABLE) ×12 IMPLANT
TUBE CONNECTING 20'X1/4 (TUBING) ×1
TUBE CONNECTING 20X1/4 (TUBING) ×3 IMPLANT
UNDERPAD 30X36 HEAVY ABSORB (UNDERPADS AND DIAPERS) ×8 IMPLANT
YANKAUER SUCT BULB TIP NO VENT (SUCTIONS) ×4 IMPLANT

## 2020-07-19 NOTE — Interval H&P Note (Signed)
History and Physical Interval Note:  07/19/2020 10:22 AM  [name]  has presented today for surgery, with the diagnosis of LEFT BREAST DUCTAL CARCINOMA IN SITU.  The various methods of treatment have been discussed with the patient and family. After consideration of risks, benefits and other options for treatment, the patient has consented to  Procedure(s): LEFT BREAST LUMPECTOMY WITH RADIOACTIVE SEED LOCALIZATION (Left) BILATERAL MAMMARY REDUCTION  (BREAST) ONCO PLASTIC FOR LEFT BREAST CANCER (Bilateral) as a surgical intervention.  The patient's history has been reviewed, patient examined, no change in status, stable for surgery.  I have reviewed the patient's chart and labs.  Questions were answered to the patient's satisfaction.     Deanna Potter

## 2020-07-19 NOTE — Op Note (Signed)
Breast Reduction Op note:    DATE OF PROCEDURE: 07/19/2020  LOCATION: [facility]  SURGEON: Lyndee Leo Sanger Dagoberto Nealy, DO  ASSISTANT: Phoebe Sharps, PA  PREOPERATIVE DIAGNOSIS 1. Left Breast cancer 2. Breast Asymmetry after partial mastectomy 3. Back Pain / Neck Pain  POSTOPERATIVE DIAGNOSIS Same as preoperative  PROCEDURES 1. Oncoplastic left breast reduction. 290 g 2. Right breast reduction for symmetry 829 gm  COMPLICATIONS: None.  DRAINS: none  INDICATIONS FOR PROCEDURE Deanna Potter is a 69 y.o. year-old female born on 11-18-1951,with a history of symptomatic macromastia with concominant back pain, neck pain, shoulder grooving from her bra.   MRN: [id number]  CONSENT Informed consent was obtained directly from the patient. The risks, benefits and alternatives were fully discussed. Specific risks including but not limited to bleeding, infection, hematoma, seroma, scarring, pain, nipple necrosis, asymmetry, poor cosmetic results, and need for further surgery were discussed. The patient had ample opportunity to have her questions answered to her satisfaction.  DESCRIPTION OF PROCEDURE  Patient was brought into the operating room and placed in a supine position.  SCDs were placed and appropriate padding was performed.  Antibiotics were given. The patient underwent general anesthesia and the chest was prepped and draped in a sterile fashion.  A timeout was performed and all information was confirmed to be correct.  The patient underwent a left partial mastectomy with general surgery.  This portion of the case is dictated separately by Dr. Marlou Starks.  Left side:  Preoperative markings were confirmed.  Incision lines were injected with 1% Xylocaine with epinephrine.  After waiting for vasoconstriction, the marked lines were incised.  A Wise-pattern superomedial breast reduction was performed by de-epithelializing the pedicle, using bovie to create the  superomedial pedicle, and removing breast tissue from the superior, lateral, and inferior portions of the breast.  The superior lateral portion was removed previously by general surgery. Care was taken to not undermine the breast pedicle. The inferior portion was labeled as such.  The superior pedicle portion was labeled as such. The inferior margin of specimen was labeled and marked by General surgery. There was additional lateral specimen and this was marked lateral and deep.   Hemostasis was achieved.  The nipple was gently rotated into position and the soft tissue closed with 4-0 Monocryl.   The pocket was irrigated and hemostasis confirmed.  The deep tissues were approximated with 3-0 Monocryl sutures and the skin was closed with deep dermal and subcuticular 4-0 Monocryl sutures.  The nipple and skin flaps had good capillary refill at the end of the procedure.    Right side:  Preoperative markings were confirmed.  Incision lines were injected with 1% Xylocaine with epinephrine.  After waiting for vasoconstriction, the marked lines were incised.  A Wise-pattern superomedial breast reduction was performed by de-epithelializing the pedicle, using bovie to create the superomedial pedicle, and removing breast tissue from the superior, lateral, and inferior portions of the breast.  Care was taken to not undermine the breast pedicle. Hemostasis was achieved.  The nipple was gently rotated into position and the soft tissue was closed with 4-0 Monocryl.  The patient was sat upright and size and shape symmetry was confirmed.  The pocket was irrigated and hemostasis confirmed.  The deep tissues were approximated with 3-0 Monocryl sutures and the skin was closed with deep dermal and subcuticular 4-0 Monocryl sutures.  Dermabond was applied.  A breast binder and ABDs were placed.  The nipple and skin flaps had  good capillary refill at the end of the procedure.  The patient tolerated the procedure well. The patient was  allowed to wake from anesthesia and taken to the recovery room in satisfactory condition.   The advanced practice practitioner (APP) assisted throughout the case.  The APP was essential in retraction and counter traction when needed to make the case progress smoothly.  This retraction and assistance made it possible to see the tissue plans for the procedure.  The assistance was needed for blood control, tissue re-approximation and assisted with closure of the incision site.

## 2020-07-19 NOTE — Interval H&P Note (Signed)
History and Physical Interval Note:  07/19/2020 10:11 AM  [facility] [place]  has presented today for surgery, with the diagnosis of LEFT BREAST DUCTAL CARCINOMA IN SITU.  The various methods of treatment have been discussed with the patient and family. After consideration of risks, benefits and other options for treatment, the patient has consented to  Procedure(s): LEFT BREAST LUMPECTOMY WITH RADIOACTIVE SEED LOCALIZATION (Left) BILATERAL MAMMARY REDUCTION  (BREAST) ONCO PLASTIC FOR LEFT BREAST CANCER (Bilateral) as a surgical intervention.  The patient's history has been reviewed, patient examined, no change in status, stable for surgery.  I have reviewed the patient's chart and labs.  Questions were answered to the patient's satisfaction.     Autumn Messing III

## 2020-07-19 NOTE — Discharge Instructions (Addendum)
INSTRUCTIONS FOR AFTER SURGERY   You will likely have some questions about what to expect following your operation.  The following information will help you and your family understand what to expect when you are discharged from the hospital.  Following these guidelines will help ensure a smooth recovery and reduce risks of complications.  Postoperative instructions include information on: diet, wound care, medications and physical activity.  AFTER SURGERY Expect to go home after the procedure.  In some cases, you may need to spend one night in the hospital for observation.  DIET This surgery does not require a specific diet.  However, I have to mention that the healthier you eat the better your body can start healing. It is important to increasing your protein intake.  This means limiting the foods with added sugar.  Focus on fruits and vegetables and some meat. It is very important to drink water after your surgery.  If your urine is bright yellow, then it is concentrated, and you need to drink more water.  As a general rule after surgery, you should have 8 ounces of water every hour while awake.  If you find you are persistently nauseated or unable to take in liquids let us know.  NO TOBACCO USE or EXPOSURE.  This will slow your healing process and increase the risk of a wound.  WOUND CARE If you don't have a drain: You can shower the day after surgery.  Use fragrance free soap.  Dial, [place], [place] and Cetaphil are usually mild on the skin.  If you have steri-strips / tape directly attached to your skin leave them in place. It is OK to get these wet.  No baths, pools or hot tubs for two weeks. We close your incision to leave the smallest and best-looking scar. No ointment or creams on your incisions until given the go ahead.  Especially not Neosporin (Too many skin reactions with this one).  A few weeks after surgery you can use Mederma and start massaging the scar. We ask you to wear your binder or  sports bra for the first 6 weeks around the clock, including while sleeping. This provides added comfort and helps reduce the fluid accumulation at the surgery site.  ACTIVITY No heavy lifting until cleared by the doctor.  It is OK to walk and climb stairs. In fact, moving your legs is very important to decrease your risk of a blood clot.  It will also help keep you from getting deconditioned.  Every 1 to 2 hours get up and walk for 5 minutes. This will help with a quicker recovery back to normal.  Let pain be your guide so you don't do too much.  NO, you cannot do the spring cleaning and don't plan on taking care of anyone else.  This is your time for TLC.   WORK Everyone returns to work at different times. As a rough guide, most people take at least 1 - 2 weeks off prior to returning to work. If you need documentation for your job, bring the forms to your postoperative follow up visit.  DRIVING Arrange for someone to bring you home from the hospital.  You may be able to drive a few days after surgery but not while taking any narcotics or valium.  BOWEL MOVEMENTS Constipation can occur after anesthesia and while taking pain medication.  It is important to stay ahead for your comfort.  We recommend taking Milk of Magnesia (2 tablespoons; twice a day) while taking  the pain pills.  SEROMA This is fluid your body tried to put in the surgical site.  This is normal but if it creates excessive pain and swelling let us know.  It usually decreases in a few weeks.  MEDICATIONS and PAIN CONTROL At your preoperative visit for you history and physical you were given the following medications: 1. An antibiotic: Start this medication when you get home and take according to the instructions on the bottle. 2. Zofran 4 mg:  This is to treat nausea and vomiting.  You can take this every 6 hours as needed and only if needed. 3. Norco (hydrocodone/acetaminophen) 5/325 mg:  This is only to be used after you have  taken the motrin or the tylenol. Every 8 hours as needed. Over the counter Medication to take: 4. Ibuprofen (Motrin) 600 mg:  Take this every 6 hours.  If you have additional pain then take 500 mg of the tylenol.  Only take the Norco after you have tried these two. 5. Miralax or stool softener of choice: Take this according to the bottle if you take the Many Call your surgeon's office if any of the following occur: . Fever 101 degrees F or greater . Excessive bleeding or fluid from the incision site. . Pain that increases over time without aid from the medications . Redness, warmth, or pus draining from incision sites . Persistent nausea or inability to take in liquids . Severe misshapen area that underwent the operation.

## 2020-07-19 NOTE — Interval H&P Note (Signed)
History and Physical Interval Note:  07/19/2020 10:23 AM  [facility] [place]  has presented today for surgery, with the diagnosis of LEFT BREAST DUCTAL CARCINOMA IN SITU.  The various methods of treatment have been discussed with the patient and family. After consideration of risks, benefits and other options for treatment, the patient has consented to  Procedure(s): LEFT BREAST LUMPECTOMY WITH RADIOACTIVE SEED LOCALIZATION (Left) BILATERAL MAMMARY REDUCTION  (BREAST) ONCO PLASTIC FOR LEFT BREAST CANCER (Bilateral) as a surgical intervention.  The patient's history has been reviewed, patient examined, no change in status, stable for surgery.  I have reviewed the patient's chart and labs.  Questions were answered to the patient's satisfaction.     Loel Lofty Esli Jernigan

## 2020-07-19 NOTE — Transfer of Care (Signed)
Immediate Anesthesia Transfer of Care Note  Patient: Deanna Potter  Procedure(s) Performed: LEFT BREAST LUMPECTOMY WITH RADIOACTIVE SEED LOCALIZATION (Left Breast) BILATERAL MAMMARY REDUCTION  (BREAST) ONCO PLASTIC FOR LEFT BREAST CANCER (Bilateral Breast)  Patient Location: PACU  Anesthesia Type:General  Level of Consciousness: drowsy, patient cooperative and responds to stimulation  Airway & Oxygen Therapy: Patient Spontanous Breathing and Patient connected to nasal cannula oxygen  Post-op Assessment: Report given to RN, Post -op Vital signs reviewed and stable, Patient moving all extremities and Patient able to stick tongue midline  Post vital signs: Reviewed  Last Vitals:  Vitals Value Taken Time  BP 192/93 07/19/20 1300  Temp 36.4 C 07/19/20 1259  Pulse 80 07/19/20 1304  Resp 22 07/19/20 1304  SpO2 99 % 07/19/20 1304  Vitals shown include unvalidated device data.  Last Pain:  Vitals:   07/19/20 0855  TempSrc:   PainSc: 0-No pain      Patients Stated Pain Goal: 3 (40/37/09 6438)  Complications: No complications documented.

## 2020-07-19 NOTE — H&P (Signed)
Deanna Potter  Location: [facility] Patient #: [id number] DOB: [birth year] Undefined / Language: Undefined / Race: White Female   History of Present Illness  The patient is a 69 year old female who presents with breast cancer.We are asked to see the patient in consultation by Dr. Lindi Adie to evaluate her for new left breast cancer. The patient is a 69 year old black female who presents with 3 areas of calcs in the left breast. 2 were benign but the third measured 85mm in the UOQ and came back as High grade DCIS that was ER and PR +. She has no family history and does not smoke   Medication History  Medications Reconciled    Review of Systems  General Not Present- Appetite Loss, Chills, Fatigue, Fever, Night Sweats, Weight Gain and Weight Loss. Note: All other systems negative (unless as noted in HPI & included Review of Systems) Skin Not Present- Change in Wart/Mole, Dryness, Hives, Jaundice, New Lesions, Non-Healing Wounds, Rash and Ulcer. HEENT Not Present- Earache, Hearing Loss, Hoarseness, Nose Bleed, Oral Ulcers, Ringing in the Ears, Seasonal Allergies, Sinus Pain, Sore Throat, Visual Disturbances, Wears glasses/contact lenses and Yellow Eyes. Respiratory Not Present- Bloody sputum, Chronic Cough, Difficulty Breathing, Snoring and Wheezing. Breast Not Present- Breast Mass, Breast Pain, Nipple Discharge and Skin Changes. Cardiovascular Not Present- Chest Pain, Difficulty Breathing Lying Down, Leg Cramps, Palpitations, Rapid Heart Rate, Shortness of Breath and Swelling of Extremities. Gastrointestinal Not Present- Abdominal Pain, Bloating, Bloody Stool, Change in Bowel Habits, Chronic diarrhea, Constipation, Difficulty Swallowing, Excessive gas, Gets full quickly at meals, Hemorrhoids, Indigestion, Nausea, Rectal Pain and Vomiting. Female Genitourinary Not Present- Frequency, Nocturia, Painful Urination, Pelvic Pain and Urgency. Musculoskeletal Present- Back Pain. Not Present-  Joint Pain, Joint Stiffness, Muscle Pain, Muscle Weakness and Swelling of Extremities. Neurological Not Present- Decreased Memory, Fainting, Headaches, Numbness, Seizures, Tingling, Tremor, Trouble walking and Weakness. Psychiatric Not Present- Anxiety, Bipolar, Change in Sleep Pattern, Depression, Fearful and Frequent crying. Endocrine Not Present- Cold Intolerance, Excessive Hunger, Hair Changes, Heat Intolerance, Hot flashes and New Diabetes. Hematology Not Present- Easy Bruising, Excessive bleeding, Gland problems, HIV and Persistent Infections.   Physical Exam  General Mental Status-Alert. General Appearance-Consistent with stated age. Hydration-Well hydrated. Voice-Normal.  Head and Neck Head-normocephalic, atraumatic with no lesions or palpable masses. Trachea-midline. Thyroid Gland Characteristics - normal size and consistency.  Eye Eyeball - Bilateral-Extraocular movements intact. Sclera/Conjunctiva - Bilateral-No scleral icterus.  Chest and Lung Exam Chest and lung exam reveals -quiet, even and easy respiratory effort with no use of accessory muscles and on auscultation, normal breath sounds, no adventitious sounds and normal vocal resonance. Inspection Chest Wall - Normal. Back - normal.  Breast Note: there is no palpable mass in either breast. there is no palpable axillary, supraclavicular, or cervical lymphadenopathy   Cardiovascular Cardiovascular examination reveals -normal heart sounds, regular rate and rhythm with no murmurs and normal pedal pulses bilaterally.  Abdomen Inspection Inspection of the abdomen reveals - No Hernias. Skin - Scar - no surgical scars. Palpation/Percussion Palpation and Percussion of the abdomen reveal - Soft, Non Tender, No Rebound tenderness, No Rigidity (guarding) and No hepatosplenomegaly. Auscultation Auscultation of the abdomen reveals - Bowel sounds normal.  Neurologic Neurologic evaluation reveals  -alert and oriented x 3 with no impairment of recent or remote memory. Mental Status-Normal.  Musculoskeletal Normal Exam - Left-Upper Extremity Strength Normal and Lower Extremity Strength Normal. Normal Exam - Right-Upper Extremity Strength Normal and Lower Extremity Strength Normal.  Lymphatic Head &  Neck  General Head & Neck Lymphatics: Bilateral - Description - Normal. Axillary  General Axillary Region: Bilateral - Description - Normal. Tenderness - Non Tender. Femoral & Inguinal  Generalized Femoral & Inguinal Lymphatics: Bilateral - Description - Normal. Tenderness - Non Tender.    Assessment & Plan DUCTAL CARCINOMA IN SITU (DCIS) OF LEFT BREAST (D05.12) Impression: The patient appears to have a 39mm area of dcis in the UOQ of the left breast. I have discussed with her the different options for treatment and at this point she favors breast conservation which I feel is a very reasonable way of treating her cancer. She will discuss adjuvant treatment with the oncologists. She will not need a node evaluation. She has an upcoming appt with Plastics to consider breast reduction for her back pain. This could potentially be done at the same time. I have discussed with her the risks and benefits of the surgery as well as some of the technical aspects including the use of a radioactive seed and she understands and wishes to proceed. This patient encounter took 60 minutes today to perform the following: take history, perform exam, review outside records, interpret imaging, counsel the patient on their diagnosis and document encounter, findings & plan in the EHR Current Plans Referred to Oncology, for evaluation and follow up (Oncology). Routine.

## 2020-07-19 NOTE — Op Note (Signed)
07/19/2020  11:33 AM  PATIENT:  Deanna Potter  69 y.o. female  PRE-OPERATIVE DIAGNOSIS:  LEFT BREAST DUCTAL CARCINOMA IN SITU  POST-OPERATIVE DIAGNOSIS:  LEFT BREAST DUCTAL CARCINOMA IN SITU  PROCEDURE:  Procedure(s): LEFT BREAST LUMPECTOMY WITH RADIOACTIVE SEED LOCALIZATION (Left)  SURGEON:  Surgeon(s) and Role: Panel 1:    * Jovita Kussmaul, MD - Primary  PHYSICIAN ASSISTANT:   ASSISTANTS: Dr. Marla Roe   ANESTHESIA:   local and general  EBL:  minimal   BLOOD ADMINISTERED:none  DRAINS: none   LOCAL MEDICATIONS USED:  MARCAINE     SPECIMEN:  Source of Specimen:  left breast tissue with additional margins  DISPOSITION OF SPECIMEN:  PATHOLOGY  COUNTS:  YES  TOURNIQUET:  * No tourniquets in log *  DICTATION: .Dragon Dictation   After informed consent was obtained the patient was brought to the operating room and placed in the supine position on the operating table.  After adequate induction of general anesthesia the patient's bilateral chest, breast, and axillary areas were prepped with ChloraPrep, allowed to dry, and draped in usual sterile manner.  An appropriate timeout was performed.  Previously an I-125 seed was placed in the upper outer quadrant of the left breast to mark an area of ductal carcinoma in situ.  The neoprobe was set to I-125 in the area of radioactivity was readily identified.  Dr. Marla Roe marked the patient for the reduction.  In the upper outer quadrant I am a a curvilinear incision along her reduction incision mark.  The incision was then carried through the skin and subcutaneous tissue sharply with the electrocautery.  Dissection was then carried out in the upper outer quadrant of the left breast between the breast tissue and the subcutaneous fat leaving a fairly thin skin flap.  Once the dissection was well beyond the area of the radioactive seed I then removed a circular portion of breast tissue sharply with the electrocautery around the radioactive  seed while checking the area of radioactivity frequently.  Once the specimen was removed it was oriented with the appropriate paint colors.  A specimen radiograph was obtained that showed the clip and seed to be within the specimen and slightly off to the lateral edge.  I removed an additional lateral edge and sent this separately as the first additional lateral margin.  This was marked with the appropriate paint color.  The operation was then turned over to Dr. Marla Roe for the reconstruction and her portion will be dictated separately.  I did supervise the removal of portions of breast tissue around the previous lumpectomy cavity and marked these appropriately with paint and labeled them.  The patient tolerated the procedure well.  At the end of this portion all needle sponge and instrument counts were correct.  The patient was in stable condition.  PLAN OF CARE: Discharge to home after PACU  PATIENT DISPOSITION:  PACU - hemodynamically stable.   Delay start of Pharmacological VTE agent (>24hrs) due to surgical blood loss or risk of bleeding: not applicable

## 2020-07-19 NOTE — Anesthesia Postprocedure Evaluation (Signed)
Anesthesia Post Note  Patient: Deanna Potter  Procedure(s) Performed: LEFT BREAST LUMPECTOMY WITH RADIOACTIVE SEED LOCALIZATION (Left Breast) BILATERAL MAMMARY REDUCTION  (BREAST) ONCO PLASTIC FOR LEFT BREAST CANCER (Bilateral Breast)     Patient location during evaluation: PACU Anesthesia Type: General Level of consciousness: awake and alert Pain management: pain level controlled Vital Signs Assessment: post-procedure vital signs reviewed and stable Respiratory status: spontaneous breathing, nonlabored ventilation and respiratory function stable Cardiovascular status: blood pressure returned to baseline and stable Postop Assessment: no apparent nausea or vomiting Anesthetic complications: no   No complications documented.  Last Vitals:  Vitals:   07/19/20 1400 07/19/20 1415  BP: (!) 143/78 (!) 147/78  Pulse: 77 79  Resp: 13 14  Temp:    SpO2: 96% 92%    Last Pain:  Vitals:   07/19/20 1400  TempSrc:   PainSc: [place]

## 2020-07-19 NOTE — Anesthesia Procedure Notes (Signed)
Procedure Name: Intubation Date/Time: 07/19/2020 10:44 AM Performed by: Clearnce Sorrel, CRNA Pre-anesthesia Checklist: Patient identified, Emergency Drugs available, Suction available and Patient being monitored Patient Re-evaluated:Patient Re-evaluated prior to induction Oxygen Delivery Method: Circle system utilized Preoxygenation: Pre-oxygenation with 100% oxygen Induction Type: IV induction Ventilation: Mask ventilation without difficulty Laryngoscope Size: Mac and 3 Grade View: Grade I Tube type: Oral Tube size: 7.0 mm Number of attempts: 1 Airway Equipment and Method: Stylet and Oral airway Placement Confirmation: ETT inserted through vocal cords under direct vision,  positive ETCO2 and breath sounds checked- equal and bilateral Secured at: 22 cm Tube secured with: Tape Dental Injury: Teeth and Oropharynx as per pre-operative assessment

## 2020-07-20 ENCOUNTER — Encounter (HOSPITAL_COMMUNITY): Payer: Self-pay | Admitting: General Surgery

## 2020-07-22 ENCOUNTER — Encounter: Payer: Self-pay | Admitting: *Deleted

## 2020-07-23 ENCOUNTER — Encounter: Payer: Self-pay | Admitting: *Deleted

## 2020-07-23 ENCOUNTER — Telehealth: Payer: Self-pay | Admitting: Plastic Surgery

## 2020-07-23 NOTE — Telephone Encounter (Signed)
Please call patient and let her know that she can shower starting tomorrow.  Yes, she can wear a sports bra.

## 2020-07-23 NOTE — Telephone Encounter (Signed)
Called and spoke with the patient and informed her of the message below from [facility].  Patient verbalized understanding and agreed.//AB/CMA

## 2020-07-23 NOTE — Telephone Encounter (Signed)
Patient lvm wanting to know if she can wear a sports bra and shower. She had sx 07/19/2020. Please call to advise.

## 2020-07-27 ENCOUNTER — Inpatient Hospital Stay: Payer: Medicare Other | Attending: Hematology and Oncology | Admitting: Hematology and Oncology

## 2020-07-27 ENCOUNTER — Other Ambulatory Visit: Payer: Self-pay

## 2020-07-27 ENCOUNTER — Encounter: Payer: Self-pay | Admitting: Plastic Surgery

## 2020-07-27 ENCOUNTER — Ambulatory Visit (INDEPENDENT_AMBULATORY_CARE_PROVIDER_SITE_OTHER): Payer: Medicare Other | Admitting: Plastic Surgery

## 2020-07-27 ENCOUNTER — Encounter: Payer: Self-pay | Admitting: *Deleted

## 2020-07-27 VITALS — BP 150/81 | HR 80 | Temp 98.3°F

## 2020-07-27 DIAGNOSIS — Z9889 Other specified postprocedural states: Secondary | ICD-10-CM

## 2020-07-27 DIAGNOSIS — D0512 Intraductal carcinoma in situ of left breast: Secondary | ICD-10-CM

## 2020-07-27 NOTE — Progress Notes (Signed)
The patient is a 69 year old female here for follow-up on her bilateral oncoplastic breast reduction.  She underwent a partial mastectomy on the left for cancer and had reductions on both sides for symmetry.  The incisions are healing nicely.  The dressing is in place.  I will wait to remove it till next week.  No sign of hematoma or seroma.  The patient is so far pleased.  She was accompanied by her sister today.

## 2020-07-28 ENCOUNTER — Other Ambulatory Visit: Payer: Self-pay | Admitting: *Deleted

## 2020-07-28 ENCOUNTER — Telehealth: Payer: Self-pay | Admitting: Hematology and Oncology

## 2020-07-28 ENCOUNTER — Encounter: Payer: Self-pay | Admitting: *Deleted

## 2020-07-28 DIAGNOSIS — D0512 Intraductal carcinoma in situ of left breast: Secondary | ICD-10-CM

## 2020-07-28 LAB — SURGICAL PATHOLOGY

## 2020-07-28 NOTE — Telephone Encounter (Signed)
Scheduled appt per 8/31 sch msg- unable to reach pt . Left message with appt date and time

## 2020-07-29 ENCOUNTER — Other Ambulatory Visit: Payer: Medicare Other

## 2020-08-03 ENCOUNTER — Ambulatory Visit (INDEPENDENT_AMBULATORY_CARE_PROVIDER_SITE_OTHER): Payer: Medicare Other | Admitting: Plastic Surgery

## 2020-08-03 ENCOUNTER — Encounter: Payer: Self-pay | Admitting: Plastic Surgery

## 2020-08-03 ENCOUNTER — Other Ambulatory Visit: Payer: Self-pay

## 2020-08-03 ENCOUNTER — Encounter: Payer: Medicare Other | Admitting: Plastic Surgery

## 2020-08-03 VITALS — BP 144/90 | HR 91 | Temp 98.3°F

## 2020-08-03 DIAGNOSIS — Z9889 Other specified postprocedural states: Secondary | ICD-10-CM

## 2020-08-03 DIAGNOSIS — D0512 Intraductal carcinoma in situ of left breast: Secondary | ICD-10-CM

## 2020-08-03 NOTE — Progress Notes (Signed)
Patient is a 69 year old female who presents today for follow-up after undergoing a bilateral breast reduction-oncoplastic for left breast cancer with Dr. Marla Roe in conjunction with a left breast lumpectomy with Dr. Marlou Starks on 07/19/2020.  ~ 2 weeks PO

## 2020-08-03 NOTE — Progress Notes (Signed)
   Subjective:    Patient ID: Deanna Potter, female    DOB: 1950/12/20, 69 y.o.   MRN: [id number]  The patient is a 69 yrs old wf here for postop follow-up after a breast reduction oncologic for breast cancer on her left side.  So far she is doing really well.  The incisions are healing nicely there is no sign of infection and no sign of seroma or hematoma.  Her pain is well controlled.     Review of Systems  Constitutional: Negative.   HENT: Negative.   Eyes: Negative.   Respiratory: Negative.   Cardiovascular: Negative.   Gastrointestinal: Negative.   Endocrine: Negative.   Genitourinary: Negative.   Musculoskeletal: Negative.   Hematological: Negative.   Psychiatric/Behavioral: Negative.        Objective:   Physical Exam Vitals and nursing note reviewed.  Constitutional:      Appearance: Normal appearance.  HENT:     Head: Normocephalic and atraumatic.  Cardiovascular:     Rate and Rhythm: Normal rate.  Pulmonary:     Effort: Pulmonary effort is normal. No respiratory distress.  Abdominal:     General: Abdomen is flat.  Neurological:     General: No focal deficit present.     Mental Status: She is alert and oriented to person, place, and time.  Psychiatric:        Mood and Affect: Mood normal.        Thought Content: Thought content normal.        Assessment & Plan:     ICD-10-CM   1. S/P bilateral breast reduction  Z98.890   2. Ductal carcinoma in situ (DCIS) of left breast  D05.12     The dressing was removed and new steri strips were placed.  Would like to see the patient back in 2 weeks.

## 2020-08-05 ENCOUNTER — Telehealth: Payer: Self-pay

## 2020-08-05 ENCOUNTER — Ambulatory Visit: Payer: Medicare Other

## 2020-08-05 ENCOUNTER — Other Ambulatory Visit: Payer: Self-pay

## 2020-08-05 DIAGNOSIS — I517 Cardiomegaly: Secondary | ICD-10-CM

## 2020-08-05 NOTE — Telephone Encounter (Signed)
Pt needs to give you guys information re: her health that's needs to be document. Please call pt [phone]

## 2020-08-06 NOTE — Telephone Encounter (Signed)
Spoke with patient. Information is already in Epic.

## 2020-08-08 NOTE — Progress Notes (Signed)
Patient Care Team: Jaynee Eagles, PA-C as PCP - General (Urgent Care) Mauro Kaufmann, RN as Oncology Nurse Navigator Carlynn Spry, Charlott Holler, RN as Oncology Nurse Navigator Nicholas Lose, MD as Consulting Physician (Hematology and Oncology) Jovita Kussmaul, MD as Consulting Physician (General Surgery) Gery Pray, MD as Consulting Physician (Radiation Oncology)  DIAGNOSIS:    ICD-10-CM   1. Ductal carcinoma in situ (DCIS) of left breast  D05.12     SUMMARY OF ONCOLOGIC HISTORY: Oncology History  Ductal carcinoma in situ (DCIS) of left breast  04/05/2020 Initial Diagnosis   Screening mammogram detected right breast masses and left breast masses and calcifications. Diagnostic mammogram showed benign right breast masses, and in the left breast, 2 benign masses and a 0.9cm group of calcifications in the upper, outer breast. 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   07/19/2020 Surgery   Left lumpectomy and bilateral breast reduction Marlou Starks & Dillingham):  Left breast: multifocal IDC, grade 1, 0.2cm, with high grade DCIS,  Right breast: usual ductal hyperplasia, adenosis, and incidental intraductal papilloma.     CHIEF COMPLIANT: Follow-up s/p left lumpectomy to review pathology   INTERVAL HISTORY: Deanna Potter is a 69 y.o. with above-mentioned history of left breast DCIS. She underwent a left lumpectomy and bilateral breast reduction on 07/19/20 with Dr. Marlou Starks for which pathology showed in the left breast, multifocal invasive ductal carcinoma, grade 1, 0.2cm, with high grade DCIS, and in the right breast, usual ductal hyperplasia, adenosis, and incidental intraductal papilloma. She presents to the clinic todayto review the pathology report and further treatment.   ALLERGIES:  has No Known Allergies.  MEDICATIONS:  Current Outpatient Medications  Medication Sig Dispense Refill  . amLODipine (NORVASC) 5 MG tablet Take 5 mg by mouth daily.    . B Complex Vitamins (VITAMIN B COMPLEX PO)  Take 1 tablet by mouth daily.    . Cholecalciferol (DIALYVITE VITAMIN D 5000 PO) Take 5,000 mg by mouth daily.    Marland Kitchen Cod Liver Oil CAPS Take 1 capsule by mouth daily. Plus A&D    . escitalopram (LEXAPRO) 20 MG tablet Take 20 mg by mouth daily.    Marland Kitchen gabapentin (NEURONTIN) 100 MG capsule Take 100 mg by mouth 3 (three) times daily.    Marland Kitchen HYDROcodone-acetaminophen (NORCO/VICODIN) 5-325 MG tablet Take 1-2 tablets by mouth every 6 (six) hours as needed for moderate pain or severe pain. 15 tablet 0  . latanoprost (XALATAN) 0.005 % ophthalmic solution Place 1 drop into both eyes 2 (two) times daily.     . Magnesium 250 MG TABS Take 250 mg by mouth daily.    . Multiple Vitamin (MULTIVITAMIN) capsule Take 1 capsule by mouth daily. Centrum Silver    . traZODone (DESYREL) 100 MG tablet Take 100 mg by mouth at bedtime.      No current facility-administered medications for this visit.    PHYSICAL EXAMINATION: ECOG PERFORMANCE STATUS: 1 - Symptomatic but completely ambulatory  Vitals:   08/09/20 1512  BP: 130/81  Pulse: 100  Resp: 18  Temp: (!) 96.7 F (35.9 C)  SpO2: 99%   Filed Weights   08/09/20 1512  Weight: 206 lb 11.2 oz (93.8 kg)    LABORATORY DATA:  I have reviewed the data as listed CMP Latest Ref Rng & Units 07/12/2020 04/07/2020  Glucose 70 - 99 mg/dL 98 105(H)  BUN 8 - 23 mg/dL 12 16  Creatinine 0.44 - 1.00 mg/dL 1.11(H) 1.13(H)  Sodium 135 - 145 mmol/L  137 143  Potassium 3.5 - 5.1 mmol/L 4.2 4.1  Chloride 98 - 111 mmol/L 104 109  CO2 22 - 32 mmol/L 25 22  Calcium 8.9 - 10.3 mg/dL 10.1 10.2  Total Protein 6.5 - 8.1 g/dL - 7.3  Total Bilirubin 0.3 - 1.2 mg/dL - 0.3  Alkaline Phos 38 - 126 U/L - 88  AST 15 - 41 U/L - 20  ALT 0 - 44 U/L - 23    Lab Results  Component Value Date   WBC 11.6 (H) 07/12/2020   HGB 13.7 07/12/2020   HCT 42.3 07/12/2020   MCV 77.9 (L) 07/12/2020   PLT 346 07/12/2020   NEUTROABS 6.0 04/07/2020    ASSESSMENT & PLAN:  Ductal carcinoma in situ  (DCIS) of left breast 07/19/2020: Left lumpectomy and bilateral breast reduction Marlou Starks & Dillingham):  Left breast: multifocal IDC, grade 1, 0.2cm, with high grade DCIS,  Right breast: usual ductal hyperplasia, adenosis, and incidental intraductal papilloma.  Pathology review: Being that the tumor is grade 1 and 0.2 cm she does not need any Oncotype testing.  Treatment plan: 1.  Adjuvant radiation therapy 2. followed by adjuvant antiestrogen therapy  Return to clinic at the end of radiation.     No orders of the defined types were placed in this encounter.  The patient has a good understanding of the overall plan. she agrees with it. she will call with any problems that may develop before the next visit here.  Total time spent: 30 mins including face to face time and time spent for planning, charting and coordination of care  Nicholas Lose, MD 08/09/2020  I, Cloyde Reams Dorshimer, am acting as scribe for Dr. Nicholas Lose.  I have reviewed the above documentation for accuracy and completeness, and I agree with the above.

## 2020-08-09 ENCOUNTER — Inpatient Hospital Stay: Payer: Medicare Other | Attending: Hematology and Oncology | Admitting: Hematology and Oncology

## 2020-08-09 ENCOUNTER — Other Ambulatory Visit: Payer: Self-pay

## 2020-08-09 DIAGNOSIS — D0512 Intraductal carcinoma in situ of left breast: Secondary | ICD-10-CM | POA: Insufficient documentation

## 2020-08-09 NOTE — Assessment & Plan Note (Signed)
07/19/2020: Left lumpectomy and bilateral breast reduction Marlou Starks & Dilling[facility]):  Left breast: multifocal IDC, grade 1, 0.2cm, with high grade DCIS,  Right breast: usual ductal hyperplasia, adenosis, and incidental intraductal papilloma.  Pathology review: Being that the tumor is grade 1 and 0.2 cm she does not need any Oncotype testing.  Treatment plan: 1.  Adjuvant radiation therapy 2. followed by adjuvant antiestrogen therapy  Return to clinic at the end of radiation.

## 2020-08-10 ENCOUNTER — Encounter: Payer: Self-pay | Admitting: *Deleted

## 2020-08-10 ENCOUNTER — Telehealth: Payer: Self-pay | Admitting: Hematology and Oncology

## 2020-08-10 NOTE — Telephone Encounter (Signed)
No 9/13 los, no changes made to pt schedule

## 2020-08-11 ENCOUNTER — Encounter: Payer: Self-pay | Admitting: *Deleted

## 2020-08-17 ENCOUNTER — Ambulatory Visit: Payer: Medicare Other | Admitting: Surgical

## 2020-08-20 NOTE — Progress Notes (Deleted)
Patient is a 69 year old female who presents today for follow-up after undergoing a bilateral breast reduction-oncoplastic for left breast cancer with Dr. Marla Roe in conjunction with a left breast lumpectomy with Dr. Marlou Starks on 07/19/2020.  ~ 5 weeks PO

## 2020-08-20 NOTE — Progress Notes (Signed)
Error

## 2020-08-23 NOTE — Progress Notes (Signed)
Radiation Oncology         [phone] ________________________________  Name: Deanna Potter MRN: [id number]  Date: 08/25/2020  DOB: [birth year]  Re-Evaluation Note  CC: Jaynee Eagles, Gennette Pac, MD    ICD-10-CM   1. Ductal carcinoma in situ (DCIS) of left breast  D05.12 Ambulatory referral to Social Work    Diagnosis: Stage mpT1a Left Breast, Multifocal Invasive Ductal Carcinoma with high-grade DCIS, ER+ / PR+, Grade 1  Narrative:  The patient returns today to discuss radiation treatment options. She was seen in the multidisciplinary breast clinic on 04/07/2020. At that time, it was recommended that she proceed with breast MRI, left lumpectomy, adjuvant radiation therapy, and aromatase inhibitor.  Bilateral breast MRI was performed on 04/20/2020 and showed a subtle linear non-mass enhancement that extended anterior to the biopsy site in the superior central left breast. In total, area spanned 4.8 cm with the clip located at the posterior aspect. There was no MRI evidence of malignancy in the right breast.  Of note, the patient was scheduled to undergo an MRI-guided biopsy on 05/18/2020, however it was cancelled secondary to nausea.  MRI of the left breast was performed on 06/11/2020. The only possible suspicious enhancement anterior to the biopsy clip only extended 0.9 cm anterior to the biopsy clip artifact. Given the close proximity of farther extent of disease to biopsy-proven DCIS (less than 1 cm), the MRI-guided left breast biopsy was not performed.  The patient underwent a left breast lumpectomy (Dr. Marlou Starks) and oncoplastic left breast reduction with right breast reduction for symmetry (Dr. Marla Roe) on 07/19/2020. Pathology from the procedure revealed grade 1 multifocal invasive ductal carcinoma with high-grade ductal carcinoma in-situ. There were also calcifications associated with the carcinoma. Margins were uninvolved by carcinoma. Residual DCIS was seen in the left first  additional lateral margin (margin uninvolved by carcinoma at (0.2 cm)) and the left superior pedicle/inferior edge (margin uninvolved by carcinoma (<0.1 cm)). No carcinoma was identified at the left second additional lateral margin. Seborrheic keratosis without residual carcinoma was identified at the left inferior pedicle. Seborrheic keratosis was also noted of the left breast skin. Finally, usual ductal hyperplasia was noted at the left breast pedicle and right breast mammoplasty (with adenosis and incidental intraductal papilloma).   The patient was last seen by Dr. Lindi Adie on 08/09/2020, during which time Oncotype Dx was not needed since the tumor was grade 1. It was recommended that she proceed with adjuvant radiation therapy and then return for adjuvant antiestrogen therapy.  On review of systems, the patient reports left breast pain rated 3/10. She also reports a pulling sensation in her dissolvable sutures. She denies nipple discharge and any other symptoms.    Allergies:  has No Known Allergies.  Meds: Current Outpatient Medications  Medication Sig Dispense Refill  . amLODipine (NORVASC) 5 MG tablet Take 5 mg by mouth daily.    . B Complex Vitamins (VITAMIN B COMPLEX PO) Take 1 tablet by mouth daily.    . Cholecalciferol (DIALYVITE VITAMIN D 5000 PO) Take 5,000 mg by mouth daily.    Marland Kitchen Cod Liver Oil CAPS Take 1 capsule by mouth daily. Plus A&D    . escitalopram (LEXAPRO) 20 MG tablet Take 20 mg by mouth daily.    Marland Kitchen gabapentin (NEURONTIN) 100 MG capsule Take 100 mg by mouth 3 (three) times daily.    Marland Kitchen HYDROcodone-acetaminophen (NORCO/VICODIN) 5-325 MG tablet Take 1-2 tablets by mouth every 6 (six) hours as needed for moderate pain or  severe pain. 15 tablet 0  . hydrOXYzine (ATARAX/VISTARIL) 50 MG tablet Take 50 mg by mouth 3 (three) times daily.    Marland Kitchen latanoprost (XALATAN) 0.005 % ophthalmic solution Place 1 drop into both eyes 2 (two) times daily.     . Magnesium 250 MG TABS Take 250 mg by  mouth daily.    . Multiple Vitamin (MULTIVITAMIN) capsule Take 1 capsule by mouth daily. Centrum Silver    . MYRBETRIQ 25 MG TB24 tablet Take 25 mg by mouth daily.    . traZODone (DESYREL) 100 MG tablet Take 100 mg by mouth at bedtime.      No current facility-administered medications for this encounter.    Physical Findings: The patient is in no acute distress. Patient is alert and oriented.  height is 5' (1.524 m) and weight is 205 lb 9.6 oz (93.3 kg). Her oral temperature is 98.3 F (36.8 C). Her blood pressure is 134/81 and her pulse is 82. Her respiration is 17 and oxygen saturation is 100%.  No significant changes. Lungs are clear to auscultation bilaterally. Heart has regular rate and rhythm. No palpable cervical, supraclavicular, or axillary adenopathy. Abdomen soft, non-tender, normal bowel sounds. Right breast: no palpable mass, nipple discharge or bleeding.  Surgical scars in the inferior breast and periareolar area from her mammoplasty Left breast: Surgical scars noted in the inferior aspect of the breast and periareolar area from her  mammoplasty scars healing well without signs of drainage or infection.  Range of movement in the left arm somewhat limited.  Lab Findings: Lab Results  Component Value Date   WBC 11.6 (H) 07/12/2020   HGB 13.7 07/12/2020   HCT 42.3 07/12/2020   MCV 77.9 (L) 07/12/2020   PLT 346 07/12/2020    Radiographic Findings: PCV ECHOCARDIOGRAM COMPLETE  Result Date: 08/08/2020 Echocardiogram 08/05/2020: Normal LV systolic function with visual EF 50-55%. Left ventricle cavity is normal in size. Severe left ventricular hypertrophy. Normal global wall motion. Normal diastolic filling pattern, normal LAP. Calculated EF 55%. Mild (Grade I) aortic regurgitation. Mild tricuspid regurgitation. No evidence of pulmonary hypertension. No prior study for comparison.   Impression:  Stage mpT1a Left Breast, Multifocal Invasive Ductal Carcinoma with high-grade DCIS,  ER+ / PR+, Grade 1  I discussed with the patient that her lumpectomy specimen showed a couple of small focus of invasive ductal breast cancer, low-grade.  We discussed that recommendations would be to potentially consider a sentinel node procedure which would involve an additional procedure.  Given the findings of a low-grade tumor in such a small area of involvement patient does not wish to proceed with this additional surgery.  I discussed with her that we will likely cover levels 1 and part of level 2 of the axilla with her planned breast radiation treatments.  Plan:  Patient was scheduled for CT simulation tomorrow, 08/26/2020,   However the patient is not quite ready to proceed with CT simulation given her left arm mobility.  She also  requires additional healing from her mammoplasty.  She is  tentatively scheduled for simulation first week in October with treatments to begin second week in October.  The patient will also be seen Dr. Marla Roe in the near future for clearance prior to proceeding with radiation therapy.  She would appear to be a good candidate for hypofractionated accelerated radiation therapy over approximately 4 weeks.  Total time spent in this encounter was 35 minutes which included reviewing the patient's most recent MRIs, lumpectomy, pathology report, follow-up with  Dr. Lindi Adie, physical examination, and documentation.  -----------------------------------  Blair Promise, PhD, MD  This document serves as a record of services personally performed by Gery Pray, MD. It was created on his behalf by Clerance Lav, a trained medical scribe. The creation of this record is based on the scribe's personal observations and the provider's statements to them. This document has been checked and approved by the attending provider.

## 2020-08-24 ENCOUNTER — Ambulatory Visit: Payer: Medicare Other | Admitting: Plastic Surgery

## 2020-08-25 ENCOUNTER — Other Ambulatory Visit: Payer: Self-pay

## 2020-08-25 ENCOUNTER — Ambulatory Visit
Admission: RE | Admit: 2020-08-25 | Discharge: 2020-08-25 | Disposition: A | Discharge status: Home / Self Care | Payer: Medicare Other | Source: Ambulatory Visit | Attending: Radiation Oncology | Admitting: Radiation Oncology

## 2020-08-25 ENCOUNTER — Encounter: Payer: Self-pay | Admitting: Radiation Oncology

## 2020-08-25 VITALS — BP 134/81 | HR 82 | Temp 98.3°F | Resp 17 | Ht 60.0 in | Wt 205.6 lb

## 2020-08-25 DIAGNOSIS — Z79899 Other long term (current) drug therapy: Secondary | ICD-10-CM | POA: Diagnosis not present

## 2020-08-25 DIAGNOSIS — Z170 Estrogen receptor positive status [ER+]: Secondary | ICD-10-CM | POA: Diagnosis not present

## 2020-08-25 DIAGNOSIS — C50412 Malignant neoplasm of upper-outer quadrant of left female breast: Secondary | ICD-10-CM | POA: Diagnosis present

## 2020-08-25 DIAGNOSIS — D0512 Intraductal carcinoma in situ of left breast: Secondary | ICD-10-CM

## 2020-08-26 ENCOUNTER — Ambulatory Visit: Payer: Medicare Other | Admitting: Radiation Oncology

## 2020-08-26 ENCOUNTER — Encounter: Payer: Self-pay | Admitting: Licensed Clinical Social Worker

## 2020-08-26 ENCOUNTER — Encounter: Payer: Self-pay | Admitting: *Deleted

## 2020-08-26 NOTE — Progress Notes (Signed)
[place] Psychosocial Distress Screening Clinical Social Work  Clinical Social Work was referred by distress screening protocol.  The patient scored a 10 on the Psychosocial Distress Thermometer which indicates severe distress.   Clinical Social Worker attempted to contact patient by phone to assess for distress and other psychosocial needs.  No answer, left VM with direct contact information.  ONCBCN DISTRESS SCREENING 08/25/2020 04/07/2020  Screening Type Initial Screening Initial Screening  Distress experienced in past week (1-10) 10 0  Spiritual/Religous concerns type  Relating to God  Referral to clinical social work Yes      Clinical Social Worker follow up needed: Yes.   If yes, follow up plan: Will attempt to contact once more to review distress screening   Nayzeth Altman E, LCSW

## 2020-08-26 NOTE — Progress Notes (Signed)
[place] Clinical Social Work  Patient returned call regarding distress screening. She has been having a very difficult time since surgery and finding out that a sentinel node procedure would be considered. Has been questioning more of "why me?" and feeling more pessimistic that she will be cured leading to losing hope. No active SI or plan to harm self. Patient is open to having counseling sessions to support her as she progresses with radiation treatment. CSW sent message to counseling intern and will determine if she has availability.  Patient also experiencing financial strain. 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 CSW referred to Meredith/ Vincente [employer] for [employer]. Mailed [place] in [place] applications to patient.   Edwinna Areola Maricela Schreur, LCSW

## 2020-08-30 ENCOUNTER — Telehealth: Payer: Self-pay

## 2020-08-30 NOTE — Telephone Encounter (Signed)
Deanna Potter answered and agreed to counseling. Scheduled for 10/5 at 3pm and client will call back to provide email for paperwork.

## 2020-09-01 ENCOUNTER — Other Ambulatory Visit: Payer: Self-pay

## 2020-09-01 ENCOUNTER — Ambulatory Visit
Admission: RE | Admit: 2020-09-01 | Discharge: 2020-09-01 | Disposition: A | Discharge status: Home / Self Care | Payer: Medicare Other | Source: Ambulatory Visit | Attending: Radiation Oncology | Admitting: Radiation Oncology

## 2020-09-01 DIAGNOSIS — Z510 Encounter for antineoplastic radiation therapy: Secondary | ICD-10-CM | POA: Insufficient documentation

## 2020-09-01 DIAGNOSIS — Z170 Estrogen receptor positive status [ER+]: Secondary | ICD-10-CM | POA: Insufficient documentation

## 2020-09-01 DIAGNOSIS — C50912 Malignant neoplasm of unspecified site of left female breast: Secondary | ICD-10-CM | POA: Diagnosis present

## 2020-09-06 NOTE — Progress Notes (Signed)
Patient is a 69 yr-old female here for follow-up after undergoing a bilateral breast reduction - oncoplastic for left breast cancer with Dr. Marla Roe in conjunction with Dr. Marlou Starks on 07/19/20.  ~ 7 weeks PO Patient is doing very well overall.  Bilateral incisions are healing nicely, C/D/I.  No signs of redness, drainage, seroma/hematoma.  She has some Dermabond present along incisions. Denies F/C, N/V. She started radiation today and will have it daily for the next 21 days.   Continue to wear compression during the day until 3 months postop.  She no longer needs to wear compression garment at night.  May resume normal activities as tolerated.  May shower normally.  May apply Vaseline to the incisions.  Follow-up in approximately 4 weeks.  Call office with any questions/concerns.    Pictures were obtained of the patient and placed in the chart with the patient's or guardian's permission.  The [place] was signed into law in 2016 which includes the topic of electronic health records.  This provides immediate access to information in MyChart.  This includes consultation notes, operative notes, office notes, lab results and pathology reports.  If you have any questions about what you read please let us know at your next visit or call us at the office.  We are right here with you.

## 2020-09-07 ENCOUNTER — Encounter: Payer: Self-pay | Admitting: *Deleted

## 2020-09-07 DIAGNOSIS — Z510 Encounter for antineoplastic radiation therapy: Secondary | ICD-10-CM | POA: Diagnosis not present

## 2020-09-08 ENCOUNTER — Other Ambulatory Visit: Payer: Self-pay

## 2020-09-08 ENCOUNTER — Telehealth: Payer: Self-pay | Admitting: Hematology and Oncology

## 2020-09-08 ENCOUNTER — Encounter: Payer: Self-pay | Admitting: Plastic Surgery

## 2020-09-08 ENCOUNTER — Ambulatory Visit
Admission: RE | Admit: 2020-09-08 | Discharge: 2020-09-08 | Disposition: A | Discharge status: Home / Self Care | Payer: Medicare Other | Source: Ambulatory Visit | Attending: Radiation Oncology | Admitting: Radiation Oncology

## 2020-09-08 ENCOUNTER — Ambulatory Visit (INDEPENDENT_AMBULATORY_CARE_PROVIDER_SITE_OTHER): Payer: Medicare Other | Admitting: Plastic Surgery

## 2020-09-08 VITALS — BP 133/82 | HR 72 | Temp 98.2°F

## 2020-09-08 DIAGNOSIS — Z9889 Other specified postprocedural states: Secondary | ICD-10-CM

## 2020-09-08 DIAGNOSIS — D0512 Intraductal carcinoma in situ of left breast: Secondary | ICD-10-CM

## 2020-09-08 DIAGNOSIS — Z510 Encounter for antineoplastic radiation therapy: Secondary | ICD-10-CM | POA: Diagnosis not present

## 2020-09-08 NOTE — Telephone Encounter (Signed)
Scheduled appt per 10/12 sch msg- mailed reminder letter with appt date and time

## 2020-09-09 ENCOUNTER — Ambulatory Visit
Admission: RE | Admit: 2020-09-09 | Discharge: 2020-09-09 | Disposition: A | Discharge status: Home / Self Care | Payer: Medicare Other | Source: Ambulatory Visit | Attending: Radiation Oncology | Admitting: Radiation Oncology

## 2020-09-09 DIAGNOSIS — Z510 Encounter for antineoplastic radiation therapy: Secondary | ICD-10-CM | POA: Diagnosis not present

## 2020-09-10 ENCOUNTER — Ambulatory Visit
Admission: RE | Admit: 2020-09-10 | Discharge: 2020-09-10 | Disposition: A | Discharge status: Home / Self Care | Payer: Medicare Other | Source: Ambulatory Visit | Attending: Radiation Oncology | Admitting: Radiation Oncology

## 2020-09-10 DIAGNOSIS — Z510 Encounter for antineoplastic radiation therapy: Secondary | ICD-10-CM | POA: Diagnosis not present

## 2020-09-13 ENCOUNTER — Ambulatory Visit
Admission: RE | Admit: 2020-09-13 | Discharge: 2020-09-13 | Disposition: A | Discharge status: Home / Self Care | Payer: Medicare Other | Source: Ambulatory Visit | Attending: Radiation Oncology | Admitting: Radiation Oncology

## 2020-09-13 DIAGNOSIS — Z510 Encounter for antineoplastic radiation therapy: Secondary | ICD-10-CM | POA: Diagnosis not present

## 2020-09-14 ENCOUNTER — Ambulatory Visit
Admission: RE | Admit: 2020-09-14 | Discharge: 2020-09-14 | Disposition: A | Discharge status: Home / Self Care | Payer: Medicare Other | Source: Ambulatory Visit | Attending: Radiation Oncology | Admitting: Radiation Oncology

## 2020-09-14 DIAGNOSIS — Z510 Encounter for antineoplastic radiation therapy: Secondary | ICD-10-CM | POA: Diagnosis not present

## 2020-09-15 ENCOUNTER — Ambulatory Visit
Admission: RE | Admit: 2020-09-15 | Discharge: 2020-09-15 | Disposition: A | Discharge status: Home / Self Care | Payer: Medicare Other | Source: Ambulatory Visit | Attending: Radiation Oncology | Admitting: Radiation Oncology

## 2020-09-15 DIAGNOSIS — Z510 Encounter for antineoplastic radiation therapy: Secondary | ICD-10-CM | POA: Diagnosis not present

## 2020-09-15 DIAGNOSIS — D0512 Intraductal carcinoma in situ of left breast: Secondary | ICD-10-CM

## 2020-09-15 MED ORDER — ALRA NON-METALLIC DEODORANT (RAD-ONC)
1.0000 "application " | Freq: Once | TOPICAL | Status: AC
  Administered 2020-09-15: 1 via TOPICAL

## 2020-09-15 MED ORDER — RADIAPLEXRX EX GEL: Freq: Once | CUTANEOUS | Status: AC

## 2020-09-15 NOTE — Progress Notes (Signed)
09/15/2020  Patient given radiation handbook, Alra and  Radiaplex. Common side effects of radiation reviewed and the importance of skin care. Patient verbalized understanding.

## 2020-09-16 ENCOUNTER — Ambulatory Visit
Admission: RE | Admit: 2020-09-16 | Discharge: 2020-09-16 | Disposition: A | Discharge status: Home / Self Care | Payer: Medicare Other | Source: Ambulatory Visit | Attending: Radiation Oncology | Admitting: Radiation Oncology

## 2020-09-16 DIAGNOSIS — Z510 Encounter for antineoplastic radiation therapy: Secondary | ICD-10-CM | POA: Diagnosis not present

## 2020-09-17 ENCOUNTER — Ambulatory Visit
Admission: RE | Admit: 2020-09-17 | Discharge: 2020-09-17 | Disposition: A | Discharge status: Home / Self Care | Payer: Medicare Other | Source: Ambulatory Visit | Attending: Radiation Oncology | Admitting: Radiation Oncology

## 2020-09-17 DIAGNOSIS — Z510 Encounter for antineoplastic radiation therapy: Secondary | ICD-10-CM | POA: Diagnosis not present

## 2020-09-20 ENCOUNTER — Ambulatory Visit
Admission: RE | Admit: 2020-09-20 | Discharge: 2020-09-20 | Disposition: A | Discharge status: Home / Self Care | Payer: Medicare Other | Source: Ambulatory Visit | Attending: Radiation Oncology | Admitting: Radiation Oncology

## 2020-09-20 DIAGNOSIS — Z510 Encounter for antineoplastic radiation therapy: Secondary | ICD-10-CM | POA: Diagnosis not present

## 2020-09-21 ENCOUNTER — Ambulatory Visit
Admission: RE | Admit: 2020-09-21 | Discharge: 2020-09-21 | Disposition: A | Discharge status: Home / Self Care | Payer: Medicare Other | Source: Ambulatory Visit | Attending: Radiation Oncology | Admitting: Radiation Oncology

## 2020-09-21 ENCOUNTER — Ambulatory Visit: Payer: Medicare Other | Admitting: Radiation Oncology

## 2020-09-21 DIAGNOSIS — Z510 Encounter for antineoplastic radiation therapy: Secondary | ICD-10-CM | POA: Diagnosis not present

## 2020-09-22 ENCOUNTER — Ambulatory Visit
Admission: RE | Admit: 2020-09-22 | Discharge: 2020-09-22 | Disposition: A | Discharge status: Home / Self Care | Payer: Medicare Other | Source: Ambulatory Visit | Attending: Radiation Oncology | Admitting: Radiation Oncology

## 2020-09-22 DIAGNOSIS — Z510 Encounter for antineoplastic radiation therapy: Secondary | ICD-10-CM | POA: Diagnosis not present

## 2020-09-23 ENCOUNTER — Ambulatory Visit
Admission: RE | Admit: 2020-09-23 | Discharge: 2020-09-23 | Disposition: A | Discharge status: Home / Self Care | Payer: Medicare Other | Source: Ambulatory Visit | Attending: Radiation Oncology | Admitting: Radiation Oncology

## 2020-09-23 DIAGNOSIS — Z510 Encounter for antineoplastic radiation therapy: Secondary | ICD-10-CM | POA: Diagnosis not present

## 2020-09-24 ENCOUNTER — Ambulatory Visit
Admission: RE | Admit: 2020-09-24 | Discharge: 2020-09-24 | Disposition: A | Discharge status: Home / Self Care | Payer: Medicare Other | Source: Ambulatory Visit | Attending: Radiation Oncology | Admitting: Radiation Oncology

## 2020-09-24 DIAGNOSIS — Z510 Encounter for antineoplastic radiation therapy: Secondary | ICD-10-CM | POA: Diagnosis not present

## 2020-09-27 ENCOUNTER — Other Ambulatory Visit: Payer: Self-pay

## 2020-09-27 ENCOUNTER — Ambulatory Visit
Admission: RE | Admit: 2020-09-27 | Discharge: 2020-09-27 | Disposition: A | Discharge status: Home / Self Care | Payer: Medicare Other | Source: Ambulatory Visit | Attending: Radiation Oncology | Admitting: Radiation Oncology

## 2020-09-27 DIAGNOSIS — Z170 Estrogen receptor positive status [ER+]: Secondary | ICD-10-CM | POA: Insufficient documentation

## 2020-09-27 DIAGNOSIS — C50912 Malignant neoplasm of unspecified site of left female breast: Secondary | ICD-10-CM | POA: Insufficient documentation

## 2020-09-27 DIAGNOSIS — Z510 Encounter for antineoplastic radiation therapy: Secondary | ICD-10-CM | POA: Diagnosis not present

## 2020-09-28 ENCOUNTER — Ambulatory Visit
Admission: RE | Admit: 2020-09-28 | Discharge: 2020-09-28 | Disposition: A | Discharge status: Home / Self Care | Payer: Medicare Other | Source: Ambulatory Visit | Attending: Radiation Oncology | Admitting: Radiation Oncology

## 2020-09-28 ENCOUNTER — Ambulatory Visit: Payer: Medicare Other | Admitting: Radiation Oncology

## 2020-09-28 ENCOUNTER — Other Ambulatory Visit: Payer: Self-pay

## 2020-09-28 DIAGNOSIS — Z510 Encounter for antineoplastic radiation therapy: Secondary | ICD-10-CM | POA: Diagnosis not present

## 2020-09-29 ENCOUNTER — Ambulatory Visit
Admission: RE | Admit: 2020-09-29 | Discharge: 2020-09-29 | Disposition: A | Discharge status: Home / Self Care | Payer: Medicare Other | Source: Ambulatory Visit | Attending: Radiation Oncology | Admitting: Radiation Oncology

## 2020-09-29 DIAGNOSIS — Z510 Encounter for antineoplastic radiation therapy: Secondary | ICD-10-CM | POA: Diagnosis not present

## 2020-09-30 ENCOUNTER — Ambulatory Visit
Admission: RE | Admit: 2020-09-30 | Discharge: 2020-09-30 | Disposition: A | Discharge status: Home / Self Care | Payer: Medicare Other | Source: Ambulatory Visit | Attending: Radiation Oncology | Admitting: Radiation Oncology

## 2020-09-30 DIAGNOSIS — Z510 Encounter for antineoplastic radiation therapy: Secondary | ICD-10-CM | POA: Diagnosis not present

## 2020-10-01 ENCOUNTER — Ambulatory Visit
Admission: RE | Admit: 2020-10-01 | Discharge: 2020-10-01 | Disposition: A | Discharge status: Home / Self Care | Payer: Medicare Other | Source: Ambulatory Visit | Attending: Radiation Oncology | Admitting: Radiation Oncology

## 2020-10-01 DIAGNOSIS — Z510 Encounter for antineoplastic radiation therapy: Secondary | ICD-10-CM | POA: Diagnosis not present

## 2020-10-03 NOTE — Progress Notes (Deleted)
Patient is a 69 year old female here for follow-up after undergoing a bilateral oncoplastic breast reduction with Dr. Marla Roe in conjunction with Dr. Marlou Starks on 07/19/2020.  She has history of left side breast cancer.  She started radiation on 09/08/2020 and had it daily for the next 21 days.  ~ 11.5 weeks PO

## 2020-10-04 ENCOUNTER — Encounter: Payer: Self-pay | Admitting: *Deleted

## 2020-10-04 ENCOUNTER — Inpatient Hospital Stay: Payer: Medicare Other | Attending: Hematology and Oncology | Admitting: Hematology and Oncology

## 2020-10-04 ENCOUNTER — Ambulatory Visit: Payer: Medicare Other

## 2020-10-04 ENCOUNTER — Telehealth: Payer: Self-pay | Admitting: Hematology and Oncology

## 2020-10-04 ENCOUNTER — Other Ambulatory Visit: Payer: Self-pay

## 2020-10-04 DIAGNOSIS — Z170 Estrogen receptor positive status [ER+]: Secondary | ICD-10-CM | POA: Diagnosis not present

## 2020-10-04 DIAGNOSIS — C50412 Malignant neoplasm of upper-outer quadrant of left female breast: Secondary | ICD-10-CM | POA: Insufficient documentation

## 2020-10-04 DIAGNOSIS — Z923 Personal history of irradiation: Secondary | ICD-10-CM | POA: Diagnosis not present

## 2020-10-04 MED ORDER — ANASTROZOLE 1 MG PO TABS: 1.0000 mg | ORAL_TABLET | Freq: Every day | ORAL | 3 refills | Status: DC

## 2020-10-04 NOTE — Telephone Encounter (Signed)
Scheduled appointment per 11/8 los. Spoke to patient who is aware of appointment date and time.

## 2020-10-04 NOTE — Progress Notes (Signed)
Patient Care Team: Jaynee Eagles, PA-C as PCP - General (Urgent Care) Mauro Kaufmann, RN as Oncology Nurse Navigator Rockwell Germany, RN as Oncology Nurse Navigator Nicholas Lose, MD as Consulting Physician (Hematology and Oncology) Jovita Kussmaul, MD as Consulting Physician (General Surgery) Gery Pray, MD as Consulting Physician (Radiation Oncology)  DIAGNOSIS:    ICD-10-CM   1. Malignant neoplasm of upper-outer quadrant of left breast in female, estrogen receptor positive ([place])  C50.412    Z17.0     SUMMARY OF ONCOLOGIC HISTORY: Oncology History  Malignant neoplasm of upper-outer quadrant of left breast in female, estrogen receptor positive ([place])  04/05/2020 Initial Diagnosis   Screening mammogram detected right breast masses and left breast masses and calcifications. Diagnostic mammogram showed benign right breast masses, and in the left breast, 2 benign masses and a 0.9cm group of calcifications in the upper, outer breast. 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   07/19/2020 Surgery   Left lumpectomy and bilateral breast reduction Marlou Starks & Dillingham):  Left breast: multifocal IDC, grade 1, 0.2cm, with high grade DCIS,  Right breast: usual ductal hyperplasia, adenosis, and incidental intraductal papilloma.   09/19/2020 - 10/07/2020 Radiation Therapy   Adjuvant radiation     CHIEF COMPLIANT: Follow-up of left breast DCIS to discuss antiestrogen therapy  INTERVAL HISTORY: Deanna Potter is a 69 y.o. with above-mentioned history of left breastDCIS who underwent a left lumpectomy and bilateral breast reduction and is currently on radiation therapy. She presents to the clinic todayto discuss antiestrogen therapy.  Her major complaint is radiation-induced dermatitis of the breast.  She has 3 more days of radiation left.  She is feeling very tired.  ALLERGIES:  has No Known Allergies.  MEDICATIONS:  Current Outpatient Medications  Medication Sig Dispense Refill  .  amLODipine (NORVASC) 5 MG tablet Take 5 mg by mouth daily.    Marland Kitchen anastrozole (ARIMIDEX) 1 MG tablet Take 1 tablet (1 mg total) by mouth daily. 90 tablet 3  . B Complex Vitamins (VITAMIN B COMPLEX PO) Take 1 tablet by mouth daily.    . Cholecalciferol (DIALYVITE VITAMIN D 5000 PO) Take 5,000 mg by mouth daily.    Marland Kitchen Cod Liver Oil CAPS Take 1 capsule by mouth daily. Plus A&D    . escitalopram (LEXAPRO) 20 MG tablet Take 20 mg by mouth daily.    Marland Kitchen gabapentin (NEURONTIN) 100 MG capsule Take 100 mg by mouth 3 (three) times daily.    Marland Kitchen HYDROcodone-acetaminophen (NORCO/VICODIN) 5-325 MG tablet Take 1-2 tablets by mouth every 6 (six) hours as needed for moderate pain or severe pain. 15 tablet 0  . hydrOXYzine (ATARAX/VISTARIL) 50 MG tablet Take 50 mg by mouth 3 (three) times daily.    Marland Kitchen latanoprost (XALATAN) 0.005 % ophthalmic solution Place 1 drop into both eyes 2 (two) times daily.     . Magnesium 250 MG TABS Take 250 mg by mouth daily.    . Multiple Vitamin (MULTIVITAMIN) capsule Take 1 capsule by mouth daily. Centrum Silver    . MYRBETRIQ 25 MG TB24 tablet Take 25 mg by mouth daily.    . traZODone (DESYREL) 100 MG tablet Take 100 mg by mouth at bedtime.      No current facility-administered medications for this visit.    PHYSICAL EXAMINATION: ECOG PERFORMANCE STATUS: 1 - Symptomatic but completely ambulatory  Vitals:   10/04/20 1443  BP: (!) 146/78  Pulse: 75  Resp: 18  Temp: (!) 97.1 F (36.2 C)  SpO2: 98%   Filed Weights   10/04/20 1443  Weight: 206 lb (93.4 kg)      LABORATORY DATA:  I have reviewed the data as listed CMP Latest Ref Rng & Units 07/12/2020 04/07/2020  Glucose 70 - 99 mg/dL 98 105(H)  BUN 8 - 23 mg/dL 12 16  Creatinine 0.44 - 1.00 mg/dL 1.11(H) 1.13(H)  Sodium 135 - 145 mmol/L 137 143  Potassium 3.5 - 5.1 mmol/L 4.2 4.1  Chloride 98 - 111 mmol/L 104 109  CO2 22 - 32 mmol/L 25 22  Calcium 8.9 - 10.3 mg/dL 10.1 10.2  Total Protein 6.5 - 8.1 g/dL - 7.3  Total  Bilirubin 0.3 - 1.2 mg/dL - 0.3  Alkaline Phos 38 - 126 U/L - 88  AST 15 - 41 U/L - 20  ALT 0 - 44 U/L - 23    Lab Results  Component Value Date   WBC 11.6 (H) 07/12/2020   HGB 13.7 07/12/2020   HCT 42.3 07/12/2020   MCV 77.9 (L) 07/12/2020   PLT 346 07/12/2020   NEUTROABS 6.0 04/07/2020    ASSESSMENT & PLAN:  Ductal carcinoma in situ (DCIS) of left breast 07/19/2020: Left lumpectomy and bilateral breast reduction Marlou Starks & Dillingham):  Left breast: multifocal IDC, grade 1, 0.2cm, with high grade DCIS,  Right breast: usual ductal hyperplasia, adenosis, and incidental intraductal papilloma.  Pathology review: Being that the tumor is grade 1 and 0.2 cm she does not need any Oncotype testing.  Treatment plan: 1.  Adjuvant radiation therapy 09/09/2020-10/06/2020 2. followed by adjuvant antiestrogen therapy with anastrozole 1 mg daily x5 years  Anastrozole counseling:We discussed the risks and benefits of anti-estrogen therapy with aromatase inhibitors. These include but not limited to insomnia, hot flashes, mood changes, vaginal dryness, bone density loss, and weight gain. We strongly believe that the benefits far outweigh the risks. Patient understands these risks and consented to starting treatment. Planned treatment duration is 5 years.   Return to clinic in 3 months for survivorship care plan visit    No orders of the defined types were placed in this encounter.  The patient has a good understanding of the overall plan. she agrees with it. she will call with any problems that may develop before the next visit here.  Total time spent: 30 mins including face to face time and time spent for planning, charting and coordination of care  Nicholas Lose, MD 10/04/2020  I, Cloyde Reams Dorshimer, am acting as scribe for Dr. Nicholas Lose.  I have reviewed the above documentation for accuracy and completeness, and I agree with the above.

## 2020-10-04 NOTE — Assessment & Plan Note (Signed)
07/19/2020: Left lumpectomy and bilateral breast reduction Marlou Starks & Dillingham):  Left breast: multifocal IDC, grade 1, 0.2cm, with high grade DCIS,  Right breast: usual ductal hyperplasia, adenosis, and incidental intraductal papilloma.  Pathology review: Being that the tumor is grade 1 and 0.2 cm she does not need any Oncotype testing.  Treatment plan: 1.  Adjuvant radiation therapy 09/09/2020-10/06/2020 2. followed by adjuvant antiestrogen therapy with anastrozole 1 mg daily x5 years  Anastrozole counseling:We discussed the risks and benefits of anti-estrogen therapy with aromatase inhibitors. These include but not limited to insomnia, hot flashes, mood changes, vaginal dryness, bone density loss, and weight gain. We strongly believe that the benefits far outweigh the risks. Patient understands these risks and consented to starting treatment. Planned treatment duration is 5 years.   Return to clinic in 3 months for survivorship care plan visit

## 2020-10-05 ENCOUNTER — Ambulatory Visit
Admission: RE | Admit: 2020-10-05 | Discharge: 2020-10-05 | Disposition: A | Discharge status: Home / Self Care | Payer: Medicare Other | Source: Ambulatory Visit | Attending: Radiation Oncology | Admitting: Radiation Oncology

## 2020-10-05 DIAGNOSIS — Z510 Encounter for antineoplastic radiation therapy: Secondary | ICD-10-CM | POA: Diagnosis not present

## 2020-10-06 ENCOUNTER — Ambulatory Visit
Admission: RE | Admit: 2020-10-06 | Discharge: 2020-10-06 | Disposition: A | Discharge status: Home / Self Care | Payer: Medicare Other | Source: Ambulatory Visit | Attending: Radiation Oncology | Admitting: Radiation Oncology

## 2020-10-06 DIAGNOSIS — Z510 Encounter for antineoplastic radiation therapy: Secondary | ICD-10-CM | POA: Diagnosis not present

## 2020-10-07 ENCOUNTER — Ambulatory Visit: Payer: Medicare Other | Admitting: Plastic Surgery

## 2020-10-07 ENCOUNTER — Ambulatory Visit: Payer: Medicare Other

## 2020-10-07 DIAGNOSIS — D0512 Intraductal carcinoma in situ of left breast: Secondary | ICD-10-CM

## 2020-10-07 DIAGNOSIS — Z923 Personal history of irradiation: Secondary | ICD-10-CM

## 2020-10-07 DIAGNOSIS — Z9889 Other specified postprocedural states: Secondary | ICD-10-CM

## 2020-10-08 ENCOUNTER — Ambulatory Visit: Payer: Medicare Other

## 2020-10-11 ENCOUNTER — Ambulatory Visit
Admission: RE | Admit: 2020-10-11 | Discharge: 2020-10-11 | Disposition: A | Discharge status: Home / Self Care | Payer: Medicare Other | Source: Ambulatory Visit | Attending: Radiation Oncology | Admitting: Radiation Oncology

## 2020-10-11 ENCOUNTER — Encounter: Payer: Self-pay | Admitting: Radiation Oncology

## 2020-10-11 DIAGNOSIS — Z510 Encounter for antineoplastic radiation therapy: Secondary | ICD-10-CM | POA: Diagnosis not present

## 2020-10-26 ENCOUNTER — Other Ambulatory Visit: Payer: Medicare Other

## 2020-10-27 ENCOUNTER — Other Ambulatory Visit: Payer: Medicare Other

## 2020-10-28 ENCOUNTER — Other Ambulatory Visit: Payer: Self-pay

## 2020-10-28 ENCOUNTER — Encounter: Payer: Self-pay | Admitting: Cardiology

## 2020-10-28 ENCOUNTER — Other Ambulatory Visit: Payer: Medicare Other

## 2020-10-28 ENCOUNTER — Ambulatory Visit: Payer: Medicare Other | Admitting: Cardiology

## 2020-10-28 VITALS — BP 140/84 | HR 90 | Resp 16 | Ht 60.0 in | Wt 207.2 lb

## 2020-10-28 DIAGNOSIS — I119 Hypertensive heart disease without heart failure: Secondary | ICD-10-CM

## 2020-10-28 DIAGNOSIS — E6609 Other obesity due to excess calories: Secondary | ICD-10-CM

## 2020-10-28 DIAGNOSIS — R9431 Abnormal electrocardiogram [ECG] [EKG]: Secondary | ICD-10-CM

## 2020-10-28 DIAGNOSIS — N1831 Chronic kidney disease, stage 3a: Secondary | ICD-10-CM

## 2020-10-28 DIAGNOSIS — I10 Essential (primary) hypertension: Secondary | ICD-10-CM

## 2020-10-28 DIAGNOSIS — E785 Hyperlipidemia, unspecified: Secondary | ICD-10-CM

## 2020-10-28 MED ORDER — METOPROLOL SUCCINATE ER 50 MG PO TB24: 50.0000 mg | ORAL_TABLET | Freq: Every day | ORAL | 3 refills | Status: DC

## 2020-10-28 NOTE — Progress Notes (Signed)
Primary Physician/Referring:  Jaynee Eagles, PA-C  Patient ID: Deanna Potter, female    DOB: May 07, 1951, 69 y.o.   MRN: [id number]  Chief Complaint  Patient presents with  . Follow-up    1 year  . Cardiomegaly  . Hypertension   HPI:    Deanna Potter  is a 69 y.o. Caucasian female patient with hypertension, chronic palpitations, very mild hyperlipidemia, obstructive sleep apnea not using CPAP, moved from [place] in 2019.  She was also told to have normal nuclear stress test at that time.  I last seen her a year ago, in the interim she was diagnosed by mammography on 07/19/2020 underwent left breast lumpectomy and she just finished her last dose of radiation therapy on 10/11/2020.  She was not recommended any adjuvant chemotherapy.  She now presents for annual visit. She denies any worsening dyspnea, chest pain, dizziness or syncope. She presents for a six-month office visit.  States that she's doing well except for occasional palpitations.  She has not had any chest pain and denies any dyspnea, PND or orthopnea.  Past Medical History:  Diagnosis Date  . Anxiety   . Cancer ([place]) 03/21/2020   Breast Cancer  . Cardiomegaly 03/13/2019  . HTN (hypertension)    Past Surgical History:  Procedure Laterality Date  . BREAST LUMPECTOMY WITH RADIOACTIVE SEED LOCALIZATION Left 07/19/2020   Procedure: LEFT BREAST LUMPECTOMY WITH RADIOACTIVE SEED LOCALIZATION;  Surgeon: Jovita Kussmaul, MD;  Location: [facility];  Service: General;  Laterality: Left;  . BREAST REDUCTION SURGERY Bilateral 07/19/2020   Procedure: BILATERAL MAMMARY REDUCTION  (BREAST) ONCO PLASTIC FOR LEFT BREAST CANCER;  Surgeon: Wallace Going, DO;  Location: [facility];  Service: Plastics;  Laterality: Bilateral;  . EYE SURGERY Bilateral   . PARTIAL HYSTERECTOMY      Social History   Tobacco Use  . Smoking status: Never Smoker  . Smokeless tobacco: Never Used  Substance Use Topics  . Alcohol use: Not Currently  Marital status: Widowed     ROS  Review of Systems  Constitutional: Negative for chills, decreased appetite, malaise/fatigue and weight gain.  HENT: Positive for hearing loss (moderate, uses hearing aids).   Eyes: Positive for visual disturbance (glaucoma).  Cardiovascular: Positive for palpitations (chronic and stable and brief). Negative for dyspnea on exertion, leg swelling and syncope.  Respiratory: Positive for snoring (Not using CPAP since last OV).   Endocrine: Negative for cold intolerance.  Hematologic/Lymphatic: Does not bruise/bleed easily.  Musculoskeletal: Positive for back pain (chronic). Negative for joint swelling.  Gastrointestinal: Negative for abdominal pain, anorexia and change in bowel habit.  Neurological: Negative for headaches and light-headedness.  Psychiatric/Behavioral: Negative for depression and substance abuse.  All other systems reviewed and are negative.  Objective   Vitals with BMI 10/28/2020 10/04/2020 09/08/2020  Height 5' 0"  5' 0"  -  Weight 207 lbs 3 oz 206 lbs -  BMI 65.53 74.82 -  Systolic 707 867 544  Diastolic 84 78 82  Pulse 90 75 72      Physical Exam Constitutional:      Comments: Short stature, moderately obese in no acute distress.  HENT:     Head: Atraumatic.  Eyes:     Conjunctiva/sclera: Conjunctivae normal.  Neck:     Thyroid: No thyromegaly.     Vascular: No JVD.  Cardiovascular:     Rate and Rhythm: Normal rate and regular rhythm.     Pulses: Intact distal pulses.     Heart sounds: Normal heart  sounds. No murmur heard.  No gallop.      Comments: No leg edema, no JVD. Pulmonary:     Effort: Pulmonary effort is normal.     Breath sounds: Normal breath sounds.  Abdominal:     General: Bowel sounds are normal.     Palpations: Abdomen is soft.  Musculoskeletal:        General: Normal range of motion.     Cervical back: Neck supple.  Skin:    General: Skin is warm and dry.  Neurological:     Mental Status: She is alert.    Laboratory  examination:   Recent Labs    04/07/20 0814 07/12/20 1505  NA 143 137  K 4.1 4.2  CL 109 104  CO2 22 25  GLUCOSE 105* 98  BUN 16 12  CREATININE 1.13* 1.11*  CALCIUM 10.2 10.1  GFRNONAA 50* 51*  GFRAA 58* 59*   CrCl cannot be calculated (Patient's most recent lab result is older than the maximum 21 days allowed.).  CMP Latest Ref Rng & Units 07/12/2020 04/07/2020  Glucose 70 - 99 mg/dL 98 105(H)  BUN 8 - 23 mg/dL 12 16  Creatinine 0.44 - 1.00 mg/dL 1.11(H) 1.13(H)  Sodium 135 - 145 mmol/L 137 143  Potassium 3.5 - 5.1 mmol/L 4.2 4.1  Chloride 98 - 111 mmol/L 104 109  CO2 22 - 32 mmol/L 25 22  Calcium 8.9 - 10.3 mg/dL 10.1 10.2  Total Protein 6.5 - 8.1 g/dL - 7.3  Total Bilirubin 0.3 - 1.2 mg/dL - 0.3  Alkaline Phos 38 - 126 U/L - 88  AST 15 - 41 U/L - 20  ALT 0 - 44 U/L - 23   CBC Latest Ref Rng & Units 07/12/2020 04/07/2020  WBC 4.0 - 10.5 K/uL 11.6(H) 10.3  Hemoglobin 12.0 - 15.0 g/dL 13.7 13.3  Hematocrit 36 - 46 % 42.3 40.2  Platelets 150 - 400 K/uL 346 379   Lipid Panel  No results found for: CHOL, TRIG, HDL, CHOLHDL, VLDL, LDLCALC, LDLDIRECT HEMOGLOBIN A1C No results found for: HGBA1C, MPG TSH No results for input(s): TSH in the last 8760 hours.   External Labs:   Labs 02/03/2019: HB 13.5/HCT 40.9, normal indicis, platelets 184.  BUN 12, creatinine 0.92, eGFR 65 mL.  Potassium 4.1.  CMP normal.  Total cholesterol 179, triglycerides 121, HDL 60, LDL 107.  Non-HDL cholesterol 129.  Medications and allergies  No Known Allergies   Current Outpatient Medications on File Prior to Visit  Medication Sig Dispense Refill  . amLODipine (NORVASC) 5 MG tablet Take 5 mg by mouth daily.    Marland Kitchen anastrozole (ARIMIDEX) 1 MG tablet Take 1 tablet (1 mg total) by mouth daily. (Patient taking differently: Take 1 mg by mouth daily. Patient will continue medication in January 2022.) 90 tablet 3  . B Complex Vitamins (VITAMIN B COMPLEX PO) Take 1 tablet by mouth daily.    .  Cholecalciferol (DIALYVITE VITAMIN D 5000 PO) Take 5,000 mg by mouth daily.    Marland Kitchen Cod Liver Oil CAPS Take 1 capsule by mouth daily. Plus A&D    . escitalopram (LEXAPRO) 20 MG tablet Take 20 mg by mouth daily.    Marland Kitchen gabapentin (NEURONTIN) 100 MG capsule Take 100 mg by mouth 3 (three) times daily.    Marland Kitchen HYDROcodone-acetaminophen (NORCO/VICODIN) 5-325 MG tablet Take 1-2 tablets by mouth every 6 (six) hours as needed for moderate pain or severe pain. 15 tablet 0  . hydrOXYzine (ATARAX/VISTARIL) 50  MG tablet Take 50 mg by mouth 3 (three) times daily.    Marland Kitchen latanoprost (XALATAN) 0.005 % ophthalmic solution Place 1 drop into both eyes 2 (two) times daily.     . Magnesium 250 MG TABS Take 250 mg by mouth daily.    . Multiple Vitamin (MULTIVITAMIN) capsule Take 1 capsule by mouth daily. Centrum Silver    . MYRBETRIQ 25 MG TB24 tablet Take 25 mg by mouth daily.    . traZODone (DESYREL) 100 MG tablet Take 100 mg by mouth at bedtime.      No current facility-administered medications on file prior to visit.     Radiology:  No results found. Cardiac Studies:   Echo and stress in [place]: Told to be normal function and told to have mild cardiomegaly. No ischemia.   Echocardiogram 08/05/2020: Normal LV systolic function with visual EF 50-55%. Left ventricle cavity is normal in size. Severe left ventricular hypertrophy. Normal global wall motion. Normal diastolic filling pattern, normal LAP. Calculated EF 55%. Mild (Grade I) aortic regurgitation. Mild tricuspid regurgitation. No evidence of pulmonary hypertension. No prior study for comparison.  EKG   EKG 10/28/2020: Normal sinus rhythm with rate of 77 bpm, left axis deviation, left anterior fascicular block.  Marked LVH by voltage and IVCD.  Abnormal EKG.   EKG 10/28/2019: Sinus rhythm with borderline first-degree AV block at the rate of 69 bpm, left atrial enlargement, marked high-voltage and IVCD, LVH with repolarization abnormality with ST-T wave  changes in the lateral leads.  Normal QT interval.    Assessment     ICD-10-CM   1. Essential hypertension  I10 EKG 12-Lead    metoprolol succinate (TOPROL-XL) 50 MG 24 hr tablet    TSH  2. Nonspecific abnormal electrocardiogram (ECG) (EKG)  R94.31   3. Hypertensive left ventricular hypertrophy, without heart failure  I11.9   4. Stage 3a chronic kidney disease (HCC)  N18.31   5. Class 2 obesity due to excess calories without serious comorbidity with body mass index (BMI) of 37.0 to 37.9 in adult  E66.09    Z68.37   6. Mild hyperlipidemia  E78.5 Lipid Panel With LDL/HDL Ratio    Meds ordered this encounter  Medications  . metoprolol succinate (TOPROL-XL) 50 MG 24 hr tablet    Sig: Take 1 tablet (50 mg total) by mouth daily. Take with or immediately following a meal.    Dispense:  90 tablet    Refill:  3  There are no discontinued medications.    Recommendations:   Deanna Potter  is a 69 y.o. Caucasian female patient with longstanding hypertension, with intermittent lack of therapy, chronic palpitations, very mild hyperlipidemia, obstructive sleep apnea on CPAP, moved from [place]  in 2019 was told to have cardiomegaly by echo and normal nuclear stress test.   I last seen her a year ago, in the interim she was diagnosed by mammography on 07/19/2020 underwent left breast lumpectomy and she just finished her last dose of radiation therapy on 10/11/2020.  I reviewed the results of the echocardiogram, patient has severe LVH, I do not suspect hypertrophic cardiomyopathy.  She was hypertensive for many years and has not been on regular medical therapy.  Also there is stage III chronic kidney disease probably again related to hypertension.  I had a very serious and long discussion with the patient, regarding weight loss and aggressive control of hypertension.  I will start her on metoprolol XL 50 mg daily, I would like to see  her back in 3 months for follow-up.  She needs lipid profile testing  and TSH.   Adrian Prows, MD, [facility] 10/28/2020, 12:30 PM Office: [phone] Pager: [phone]

## 2020-11-02 DIAGNOSIS — Z923 Personal history of irradiation: Secondary | ICD-10-CM | POA: Insufficient documentation

## 2020-11-02 NOTE — Progress Notes (Deleted)
Patient is a 69 year old female here for follow-up after undergoing a bilateral breast reduction-oncoplastic for left breast cancer with Dr. Marla Roe in conjunction with Dr. Marlou Starks on 07/19/2020.  She started radiation on 09/08/2020 and had it daily for the next 21 days.  ~ 15 weeks PO  Photos

## 2020-11-03 ENCOUNTER — Ambulatory Visit: Payer: Medicare Other | Admitting: Cardiology

## 2020-11-04 ENCOUNTER — Ambulatory Visit: Payer: Medicare Other | Admitting: Plastic Surgery

## 2020-11-04 DIAGNOSIS — Z9889 Other specified postprocedural states: Secondary | ICD-10-CM

## 2020-11-04 DIAGNOSIS — Z923 Personal history of irradiation: Secondary | ICD-10-CM

## 2020-11-08 ENCOUNTER — Ambulatory Visit
Admission: RE | Admit: 2020-11-08 | Discharge: 2020-11-08 | Disposition: A | Discharge status: Home / Self Care | Payer: Medicare Other | Source: Ambulatory Visit | Attending: Radiation Oncology | Admitting: Radiation Oncology

## 2020-11-08 NOTE — Progress Notes (Incomplete)
  Patient Name: Deanna Potter MRN: [id number] DOB: Dec 17, 1950 Referring Physician: Nicholas Lose (Profile Not Attached) Date of Service: 10/11/2020 [facility] [place]                                                        End Of Treatment Note  Diagnoses: C50.412-Malignant neoplasm of upper-outer quadrant of left female breast  Cancer Staging: StagempT1aLeftBreast,Multifocal Invasive Ductal Carcinoma with high-grade DCIS, ER+/ PR+, Grade1  Intent: Curative  Radiation Treatment Dates: 09/08/2020 through 10/11/2020 Site Technique Total Dose (Gy) Dose per Fx (Gy) Completed Fx Beam Energies  Breast, Left: Breast_Lt 3D 40.05/40.05 2.67 15/15 6X, 10X  Breast, Left: Breast_Lt_Bst specialPort 12/12 2 6/6 6E, 9E   Narrative: The patient tolerated radiation therapy relatively well. She did report some fatigue, stiffness in the left shoulder with difficulty with range of motion, and tenderness/soreness to her left breast. She denied chest pain and shortness of breath. Throughout treatment, there were some hyperpigmentation changes to the left breast area and towards the axilla. No skin breakdown or moist desquamation.  Plan: The patient will follow-up with radiation oncology in one month.  ________________________________________________   Blair Promise, PhD, MD  This document serves as a record of services personally performed by Gery Pray, MD. It was created on his behalf by Clerance Lav, a trained medical scribe. The creation of this record is based on the scribe's personal observations and the provider's statements to them. This document has been checked and approved by the attending provider.

## 2020-11-18 ENCOUNTER — Telehealth: Payer: Self-pay | Admitting: Adult Health

## 2020-11-18 NOTE — Telephone Encounter (Signed)
Left message to reschedule appointment due to provider day off. 

## 2020-11-25 ENCOUNTER — Telehealth: Payer: Self-pay | Admitting: Adult Health

## 2020-11-25 NOTE — Telephone Encounter (Signed)
Rescheduled appts per provider template change. Pt confirmed new appt date and time.  

## 2020-12-31 ENCOUNTER — Telehealth: Payer: Self-pay | Admitting: Adult Health

## 2020-12-31 NOTE — Telephone Encounter (Signed)
Left message with rescheduled appointment due to provider's covid clinic. Gave option to call back to reschedule if needed.

## 2021-01-04 ENCOUNTER — Encounter: Payer: Medicare Other | Admitting: Adult Health

## 2021-01-05 ENCOUNTER — Encounter: Payer: Medicare Other | Admitting: Adult Health

## 2021-01-07 ENCOUNTER — Telehealth: Payer: Self-pay | Admitting: *Deleted

## 2021-01-07 ENCOUNTER — Inpatient Hospital Stay: Payer: Medicare Other | Admitting: Adult Health

## 2021-01-07 NOTE — Telephone Encounter (Signed)
Called pt to make aware of missed appt. Person on other end of phone stated, "sorry she's busy right now", and hung up the phone. Was unsuccessful with reaching out to patient.

## 2021-01-22 LAB — LIPID PANEL WITH LDL/HDL RATIO
Cholesterol, Total: 187 mg/dL (ref 100–199)
HDL: 42 mg/dL (ref >39)
LDL Chol Calc (NIH): 117 mg/dL — ABNORMAL HIGH (ref 0–99)
LDL/HDL Ratio: 2.8 ratio (ref 0.0–3.2)
Triglycerides: 155 mg/dL — ABNORMAL HIGH (ref 0–149)
VLDL Cholesterol Cal: 28 mg/dL (ref 5–40)

## 2021-01-22 LAB — TSH: TSH: 1.37 u[IU]/mL (ref 0.450–4.500)

## 2021-01-28 ENCOUNTER — Encounter: Payer: Self-pay | Admitting: Cardiology

## 2021-01-28 ENCOUNTER — Ambulatory Visit: Payer: Medicare Other | Admitting: Cardiology

## 2021-01-28 ENCOUNTER — Other Ambulatory Visit: Payer: Self-pay

## 2021-01-28 VITALS — BP 130/76 | HR 85 | Temp 98.1°F | Resp 16 | Ht 60.0 in | Wt 208.6 lb

## 2021-01-28 DIAGNOSIS — I10 Essential (primary) hypertension: Secondary | ICD-10-CM

## 2021-01-28 DIAGNOSIS — Z9989 Dependence on other enabling machines and devices: Secondary | ICD-10-CM

## 2021-01-28 DIAGNOSIS — N1831 Chronic kidney disease, stage 3a: Secondary | ICD-10-CM

## 2021-01-28 DIAGNOSIS — G4733 Obstructive sleep apnea (adult) (pediatric): Secondary | ICD-10-CM

## 2021-01-28 DIAGNOSIS — I119 Hypertensive heart disease without heart failure: Secondary | ICD-10-CM

## 2021-01-28 DIAGNOSIS — E785 Hyperlipidemia, unspecified: Secondary | ICD-10-CM

## 2021-01-28 MED ORDER — ROSUVASTATIN CALCIUM 5 MG PO TABS: 5.0000 mg | ORAL_TABLET | Freq: Every day | ORAL | 2 refills | Status: DC

## 2021-01-28 MED ORDER — ROSUVASTATIN CALCIUM 5 MG PO TABS: 5.0000 mg | ORAL_TABLET | Freq: Every day | ORAL | 3 refills | Status: DC

## 2021-01-28 NOTE — Progress Notes (Signed)
Primary Physician/Referring:  Jaynee Eagles, PA-C  Patient ID: Deanna Potter, female    DOB: 1951-03-21, 70 y.o.   MRN: [id number]  Chief Complaint  Patient presents with  . Hypertension  . Hyperlipidemia  . Follow-up    3 months   HPI:    Deanna Potter  is a 70 y.o. Caucasian female patient with hypertension, chronic palpitations, very mild hyperlipidemia, obstructive sleep apnea not using CPAP, moved from [place] in 2019.  She was also told to have normal nuclear stress test at that time.  I last seen her a year ago. On 07/19/2020 underwent left breast lumpectomy and underwent radiation therapy on 10/11/2020.  She was not recommended any adjuvant chemotherapy.    She now presents for annual visit. She denies any worsening dyspnea, chest pain, dizziness or syncope. She presents for a six-month office visit.  States that she's doing well except for occasional palpitations.  She has not had any chest pain and denies any dyspnea, PND or orthopnea.  Past Medical History:  Diagnosis Date  . Anxiety   . Cancer ([place]) 03/21/2020   Breast Cancer  . Cardiomegaly 03/13/2019  . HTN (hypertension)    Past Surgical History:  Procedure Laterality Date  . BREAST LUMPECTOMY WITH RADIOACTIVE SEED LOCALIZATION Left 07/19/2020   Procedure: LEFT BREAST LUMPECTOMY WITH RADIOACTIVE SEED LOCALIZATION;  Surgeon: Jovita Kussmaul, MD;  Location: [facility];  Service: General;  Laterality: Left;  . BREAST REDUCTION SURGERY Bilateral 07/19/2020   Procedure: BILATERAL MAMMARY REDUCTION  (BREAST) ONCO PLASTIC FOR LEFT BREAST CANCER;  Surgeon: Wallace Going, DO;  Location: [facility];  Service: Plastics;  Laterality: Bilateral;  . EYE SURGERY Bilateral   . PARTIAL HYSTERECTOMY      Social History   Tobacco Use  . Smoking status: Never Smoker  . Smokeless tobacco: Never Used  Substance Use Topics  . Alcohol use: Not Currently  Marital status: Widowed    ROS  Review of Systems  HENT: Positive for hearing loss  (moderate, uses hearing aids).   Eyes: Positive for visual disturbance (glaucoma).  Cardiovascular: Positive for palpitations (chronic and stable and brief). Negative for dyspnea on exertion, leg swelling and syncope.  Respiratory: Positive for snoring (Using CPAP ).   Musculoskeletal: Positive for back pain (chronic).  All other systems reviewed and are negative.  Objective   Vitals with BMI 01/28/2021 10/28/2020 10/04/2020  Height 5' 0"  5' 0"  5' 0"   Weight 208 lbs 10 oz 207 lbs 3 oz 206 lbs  BMI 40.74 07.12 19.75  Systolic 883 254 982  Diastolic 76 84 78  Pulse 85 90 75      Physical Exam Constitutional:      Comments: Short stature, moderately obese in no acute distress.  Neck:     Thyroid: No thyromegaly.     Vascular: No JVD.  Cardiovascular:     Rate and Rhythm: Normal rate and regular rhythm.     Pulses: Intact distal pulses.     Heart sounds: Normal heart sounds. No murmur heard. No gallop.      Comments: No leg edema, no JVD. Pulmonary:     Effort: Pulmonary effort is normal.     Breath sounds: Normal breath sounds.  Abdominal:     General: Bowel sounds are normal.     Palpations: Abdomen is soft.  Musculoskeletal:        General: Normal range of motion.     Cervical back: Neck supple.  Skin:  General: Skin is warm and dry.  Neurological:     Mental Status: She is alert.    Laboratory examination:   Recent Labs    04/07/20 0814 07/12/20 1505  NA 143 137  K 4.1 4.2  CL 109 104  CO2 22 25  GLUCOSE 105* 98  BUN 16 12  CREATININE 1.13* 1.11*  CALCIUM 10.2 10.1  GFRNONAA 50* 51*  GFRAA 58* 59*   CrCl cannot be calculated (Patient's most recent lab result is older than the maximum 21 days allowed.).  CMP Latest Ref Rng & Units 07/12/2020 04/07/2020  Glucose 70 - 99 mg/dL 98 105(H)  BUN 8 - 23 mg/dL 12 16  Creatinine 0.44 - 1.00 mg/dL 1.11(H) 1.13(H)  Sodium 135 - 145 mmol/L 137 143  Potassium 3.5 - 5.1 mmol/L 4.2 4.1  Chloride 98 - 111 mmol/L 104  109  CO2 22 - 32 mmol/L 25 22  Calcium 8.9 - 10.3 mg/dL 10.1 10.2  Total Protein 6.5 - 8.1 g/dL - 7.3  Total Bilirubin 0.3 - 1.2 mg/dL - 0.3  Alkaline Phos 38 - 126 U/L - 88  AST 15 - 41 U/L - 20  ALT 0 - 44 U/L - 23   CBC Latest Ref Rng & Units 07/12/2020 04/07/2020  WBC 4.0 - 10.5 K/uL 11.6(H) 10.3  Hemoglobin 12.0 - 15.0 g/dL 13.7 13.3  Hematocrit 36.0 - 46.0 % 42.3 40.2  Platelets 150 - 400 K/uL 346 379   Lipid Panel     Component Value Date/Time   CHOL 187 01/21/2021 1312   TRIG 155 (H) 01/21/2021 1312   HDL 42 01/21/2021 1312   LDLCALC 117 (H) 01/21/2021 1312   HEMOGLOBIN A1C No results found for: HGBA1C, MPG TSH Recent Labs    01/21/21 1312  TSH 1.370     External Labs:   Labs 02/03/2019: HB 13.5/HCT 40.9, normal indicis, platelets 184.  BUN 12, creatinine 0.92, eGFR 65 mL.  Potassium 4.1.  CMP normal.  Total cholesterol 179, triglycerides 121, HDL 60, LDL 107.  Non-HDL cholesterol 129.  Medications and allergies  No Known Allergies   Current Outpatient Medications on File Prior to Visit  Medication Sig Dispense Refill  . amLODipine (NORVASC) 5 MG tablet Take 5 mg by mouth daily.    . B Complex Vitamins (VITAMIN B COMPLEX PO) Take 1 tablet by mouth daily.    . Cholecalciferol (DIALYVITE VITAMIN D 5000 PO) Take 5,000 mg by mouth daily.    Marland Kitchen Cod Liver Oil CAPS Take 1 capsule by mouth daily. Plus A&D    . escitalopram (LEXAPRO) 20 MG tablet Take 20 mg by mouth daily.    Marland Kitchen gabapentin (NEURONTIN) 100 MG capsule Take 100 mg by mouth 3 (three) times daily.    Marland Kitchen HYDROcodone-acetaminophen (NORCO/VICODIN) 5-325 MG tablet Take 1-2 tablets by mouth every 6 (six) hours as needed for moderate pain or severe pain. 15 tablet 0  . hydrOXYzine (ATARAX/VISTARIL) 50 MG tablet Take 50 mg by mouth 3 (three) times daily.    Marland Kitchen latanoprost (XALATAN) 0.005 % ophthalmic solution Place 1 drop into both eyes 2 (two) times daily.     . Magnesium 250 MG TABS Take 250 mg by mouth daily.    .  metoprolol succinate (TOPROL-XL) 50 MG 24 hr tablet Take 1 tablet (50 mg total) by mouth daily. Take with or immediately following a meal. 90 tablet 3  . Multiple Vitamin (MULTIVITAMIN) capsule Take 1 capsule by mouth daily. Centrum Silver    .  sertraline (ZOLOFT) 50 MG tablet Take 1 tablet by mouth daily.    . traZODone (DESYREL) 100 MG tablet Take 100 mg by mouth at bedtime.      No current facility-administered medications on file prior to visit.     Radiology:  No results found. Cardiac Studies:   Echo and stress in [place]: Told to be normal function and told to have mild cardiomegaly. No ischemia.   Echocardiogram 08/05/2020: Normal LV systolic function with visual EF 50-55%. Left ventricle cavity is normal in size. Severe left ventricular hypertrophy. Normal global wall motion. Normal diastolic filling pattern, normal LAP. Calculated EF 55%. Mild (Grade I) aortic regurgitation. Mild tricuspid regurgitation. No evidence of pulmonary hypertension. No prior study for comparison.  EKG   EKG 10/28/2020: Normal sinus rhythm with rate of 77 bpm, left axis deviation, left anterior fascicular block.  Marked LVH by voltage and IVCD.  Abnormal EKG.   EKG 10/28/2019: Sinus rhythm with borderline first-degree AV block at the rate of 69 bpm, left atrial enlargement, marked high-voltage and IVCD, LVH with repolarization abnormality with ST-T wave changes in the lateral leads.  Normal QT interval.    Assessment     ICD-10-CM   1. Essential hypertension  I10   2. Stage 3a chronic kidney disease (HCC)  N18.31   3. Hypertensive left ventricular hypertrophy, without heart failure  I11.9   4. OSA on CPAP  G47.33    Z99.89   5. Mild hyperlipidemia  E78.5 rosuvastatin (CRESTOR) 5 MG tablet    Lipid Panel With LDL/HDL Ratio    Lipid Panel With LDL/HDL Ratio    DISCONTINUED: rosuvastatin (CRESTOR) 5 MG tablet    Meds ordered this encounter  Medications  . DISCONTD: rosuvastatin (CRESTOR)  5 MG tablet    Sig: Take 1 tablet (5 mg total) by mouth daily.    Dispense:  30 tablet    Refill:  2  . rosuvastatin (CRESTOR) 5 MG tablet    Sig: Take 1 tablet (5 mg total) by mouth daily.    Dispense:  90 tablet    Refill:  3   Medications Discontinued During This Encounter  Medication Reason  . anastrozole (ARIMIDEX) 1 MG tablet Error  . MYRBETRIQ 25 MG TB24 tablet Error  . rosuvastatin (CRESTOR) 5 MG tablet      Recommendations:   Deanna Potter  is a 70 y.o. Caucasian female patient with hypertension, chronic palpitations, very mild hyperlipidemia, obstructive sleep apnea now using CPAP, moved from [place] in 2019.  She was also told to have normal nuclear stress test at that time.  I last seen her a year ago. On 07/19/2020 underwent left breast lumpectomy and underwent radiation therapy on 10/11/2020.  She was not recommended any adjuvant chemotherapy.   I reviewed the results of the recently performed labs, lipids are mildly elevated.  In view of radiation therapy to the chest, due to increased risk of development of coronary artery disease, I have added Crestor 5 mg daily, she will obtain repeat lipid profile testing in 2 to 3 months.  Blood pressure is well controlled, no clinical evidence of heart failure.  Weight loss again discussed with the patient.  I will see her back in 1 year or sooner if problems.  She wants to see me on annual basis.   Adrian Prows, MD, [facility] 01/28/2021, 11:32 AM Office: [phone] Pager: [phone]

## 2021-02-08 ENCOUNTER — Telehealth: Payer: Self-pay

## 2021-02-08 NOTE — Telephone Encounter (Signed)
Patient called to say that she had surgery with Dr. Marla Roe on 07/19/2020 and her surgery area is still hurting on the left side where the cancer was.  Patient said that she can't put a seat belt over it.  She said that her right side is fine.  She would like to know if this is normal.  Please call.

## 2021-02-25 ENCOUNTER — Ambulatory Visit (INDEPENDENT_AMBULATORY_CARE_PROVIDER_SITE_OTHER): Payer: Medicare Other | Admitting: Surgical

## 2021-02-25 ENCOUNTER — Other Ambulatory Visit: Payer: Self-pay

## 2021-02-25 ENCOUNTER — Encounter: Payer: Self-pay | Admitting: Surgical

## 2021-02-25 VITALS — BP 135/83 | HR 83

## 2021-02-25 DIAGNOSIS — Z923 Personal history of irradiation: Secondary | ICD-10-CM

## 2021-02-25 DIAGNOSIS — D0512 Intraductal carcinoma in situ of left breast: Secondary | ICD-10-CM

## 2021-02-25 DIAGNOSIS — Z9889 Other specified postprocedural states: Secondary | ICD-10-CM | POA: Diagnosis not present

## 2021-02-25 NOTE — Progress Notes (Signed)
   Referring Provider Jaynee Eagles, PA-C [address] [place],  [place] [address]   CC:  Chief Complaint  Patient presents with  . Follow-up      Deanna Potter is an 70 y.o. female.  HPI: Patient is a 70 year old female here for follow-up after bilateral oncoplastic breast reduction by Dr. Marla Roe after left breast lumpectomy by general surgery on 07/19/2020.  Patient is approximately 7 months postop.  She presents today with a chief complaint of left breast soreness. She does have a history of radiation to the left breast, her final treatment was on 10/11/2020.  Patient reports overall she is doing okay, she does report that she has had tenderness of the left breast.  She denies any other symptoms.  She is not having any infectious symptoms.  She has not noticed any other breast changes.  Review of Systems General: No fevers, chills, nausea, vomiting  Physical Exam Vitals with BMI 02/25/2021 01/28/2021 10/28/2020  Height - 5\' 0"  5\' 0"   Weight - 208 lbs 10 oz 207 lbs 3 oz  BMI - 55.97 41.63  Systolic 845 364 680  Diastolic 83 76 84  Pulse 83 85 90    General:  No acute distress,  Alert and oriented, Non-Toxic, Normal speech and affect Breast: Bilateral breast incisions intact, bilateral NAC's are viable with good color.  No wounds noted.  Skin changes of left breast noted after radiation.  No erythema noted.  No cellulitic changes noted.  She does have tenderness of the left breast just lateral to the NAC.  There is an area of fat necrosis with palpation.  With palpation of this area she has direct tenderness.  I do not see any overlying skin changes.  I do not appreciate any nipple areolar discharge.  There is no subcutaneous fluid collections noted.  Assessment/Plan  70 year old female status post bilateral oncoplastic breast reduction for left breast cancer.  She underwent a left breast lumpectomy by Dr. Marlou Starks followed by the oncoplastic reduction on 07/19/2020 by Dr.  Marla Roe.  She has a history of radiation to the left breast which was completed in November 2021.  I discussed with the patient that the area of concern feels consistent with fat necrosis.  I recommend she massage this area 1-2 times per day, I discussed with her that I know that this may be difficult to do given the sensitivity to the area.  I also discussed with her that with time fat necrosis can resolve on its own.  We discussed that with her history of radiation surgical intervention would be a last resort.  Overall she has a very good result after her oncoplastic reduction.  I do not see any signs of infection, seroma, hematoma.  I did discuss with her that if she notices any changes to her skin or changes in the left breast to reach out to her oncological team to further discuss.  Patient is agreeable to this.  All of her questions were answered to her content.   Carola Rhine Meilah Delrosario 02/25/2021, 10:13 AM

## 2021-06-06 ENCOUNTER — Emergency Department (HOSPITAL_COMMUNITY)
Admission: EM | Admit: 2021-06-06 | Discharge: 2021-06-07 | Disposition: A | Discharge status: Home / Self Care | Payer: Medicare Other | Attending: Emergency Medicine | Admitting: Emergency Medicine

## 2021-06-06 ENCOUNTER — Other Ambulatory Visit: Payer: Self-pay

## 2021-06-06 ENCOUNTER — Emergency Department (HOSPITAL_COMMUNITY): Payer: Medicare Other

## 2021-06-06 ENCOUNTER — Encounter (HOSPITAL_COMMUNITY): Payer: Self-pay

## 2021-06-06 DIAGNOSIS — R2243 Localized swelling, mass and lump, lower limb, bilateral: Secondary | ICD-10-CM | POA: Diagnosis not present

## 2021-06-06 DIAGNOSIS — I10 Essential (primary) hypertension: Secondary | ICD-10-CM | POA: Diagnosis not present

## 2021-06-06 DIAGNOSIS — R0602 Shortness of breath: Secondary | ICD-10-CM | POA: Diagnosis not present

## 2021-06-06 DIAGNOSIS — R5381 Other malaise: Secondary | ICD-10-CM | POA: Diagnosis not present

## 2021-06-06 DIAGNOSIS — Z853 Personal history of malignant neoplasm of breast: Secondary | ICD-10-CM | POA: Insufficient documentation

## 2021-06-06 DIAGNOSIS — Z79899 Other long term (current) drug therapy: Secondary | ICD-10-CM | POA: Diagnosis not present

## 2021-06-06 DIAGNOSIS — R5383 Other fatigue: Secondary | ICD-10-CM | POA: Diagnosis not present

## 2021-06-06 DIAGNOSIS — Z20822 Contact with and (suspected) exposure to covid-19: Secondary | ICD-10-CM | POA: Insufficient documentation

## 2021-06-06 DIAGNOSIS — R531 Weakness: Secondary | ICD-10-CM | POA: Diagnosis not present

## 2021-06-06 LAB — CBC WITH DIFFERENTIAL/PLATELET
Abs Immature Granulocytes: 0.05 10*3/uL (ref 0.00–0.07)
Basophils Absolute: 0.1 10*3/uL (ref 0.0–0.1)
Basophils Relative: 1 %
Eosinophils Absolute: 0.3 10*3/uL (ref 0.0–0.5)
Eosinophils Relative: 2 %
HCT: 41.5 % (ref 36.0–46.0)
Hemoglobin: 13.5 g/dL (ref 12.0–15.0)
Immature Granulocytes: 0 %
Lymphocytes Relative: 27 %
Lymphs Abs: 3.2 10*3/uL (ref 0.7–4.0)
MCH: 25.7 pg — ABNORMAL LOW (ref 26.0–34.0)
MCHC: 32.5 g/dL (ref 30.0–36.0)
MCV: 78.9 fL — ABNORMAL LOW (ref 80.0–100.0)
Monocytes Absolute: 1.2 10*3/uL — ABNORMAL HIGH (ref 0.1–1.0)
Monocytes Relative: 10 %
Neutro Abs: 7 10*3/uL (ref 1.7–7.7)
Neutrophils Relative %: 60 %
Platelets: 336 10*3/uL (ref 150–400)
RBC: 5.26 MIL/uL — ABNORMAL HIGH (ref 3.87–5.11)
RDW: 14.4 % (ref 11.5–15.5)
WBC: 11.8 10*3/uL — ABNORMAL HIGH (ref 4.0–10.5)
nRBC: 0 % (ref 0.0–0.2)

## 2021-06-06 NOTE — ED Triage Notes (Signed)
Pt has chronic health issues, she states that her provider wanted to do a follow up of her leg swelling for blood clots. She states that her hands and feet are swelling and that she just does not feel well.

## 2021-06-06 NOTE — ED Provider Notes (Signed)
[facility] DEPT Provider Note   CSN: [id number] Arrival date & time: 06/06/21  2040     History Chief Complaint  Patient presents with   Leg Swelling   Fatigue   Shortness of Breath    Deanna Potter is a 70 y.o. female.  Patient to ED for evaluation of vague but progressive symptoms over the last 2 weeks of fatigue, general malaise, mild SOB, bilateral LE swelling that is worse through the day, now seeing similar swelling to bilateral hands, better after resting. No fever. No chest pain or other pain. She denies cough but reports mild SOB. No sleep disturbances. No medication changes. She denies urinary symptoms, nausea, vomiting or loss of appetite.   The history is provided by the patient. No language interpreter was used.  Shortness of Breath Associated symptoms: no fever and no vomiting       Past Medical History:  Diagnosis Date   Anxiety    Cancer ([place]) 03/21/2020   Breast Cancer   Cardiomegaly 03/13/2019   HTN (hypertension)     Patient Active Problem List   Diagnosis Date Noted   Hx of radiation therapy 11/02/2020   S/P bilateral breast reduction 07/27/2020   Malignant neoplasm of upper-outer quadrant of left breast in female, estrogen receptor positive ([place]) 04/05/2020   Cardiomegaly 03/13/2019   Essential hypertension 03/13/2019   OSA on CPAP 03/13/2019    Past Surgical History:  Procedure Laterality Date   BREAST LUMPECTOMY WITH RADIOACTIVE SEED LOCALIZATION Left 07/19/2020   Procedure: LEFT BREAST LUMPECTOMY WITH RADIOACTIVE SEED LOCALIZATION;  Surgeon: Jovita Kussmaul, MD;  Location: [facility];  Service: General;  Laterality: Left;   BREAST REDUCTION SURGERY Bilateral 07/19/2020   Procedure: BILATERAL MAMMARY REDUCTION  (BREAST) ONCO PLASTIC FOR LEFT BREAST CANCER;  Surgeon: Wallace Going, DO;  Location[facility];  Service: Plastics;  Laterality: Bilateral;   EYE SURGERY Bilateral    PARTIAL HYSTERECTOMY       OB History    No obstetric history on file.     Family History  Problem Relation Age of Onset   Stomach cancer Brother     Social History   Tobacco Use   Smoking status: Never   Smokeless tobacco: Never  Vaping Use   Vaping Use: Never used  Substance Use Topics   Alcohol use: Not Currently   Drug use: Never    Home Medications Prior to Admission medications   Medication Sig Start Date End Date Taking? Authorizing Provider  amLODipine (NORVASC) 5 MG tablet Take 5 mg by mouth daily. 06/16/20   [provider]  B Complex Vitamins (VITAMIN B COMPLEX PO) Take 1 tablet by mouth daily.    [provider]  Cholecalciferol (DIALYVITE VITAMIN D 5000 PO) Take 5,000 mg by mouth daily.    [provider]  [facility] Liver Oil CAPS Take 1 capsule by mouth daily. Plus A&D    [provider]  escitalopram (LEXAPRO) 20 MG tablet Take 20 mg by mouth daily. 01/31/19   [provider]  gabapentin (NEURONTIN) 100 MG capsule Take 100 mg by mouth 3 (three) times daily. 03/09/20   [provider]  HYDROcodone-acetaminophen (NORCO/VICODIN) 5-325 MG tablet Take 1-2 tablets by mouth every 6 (six) hours as needed for moderate pain or severe pain. 07/19/20   Autumn Messing III, MD  hydrOXYzine (ATARAX/VISTARIL) 50 MG tablet Take 50 mg by mouth 3 (three) times daily. 06/16/20   [provider]  latanoprost (XALATAN) 0.005 %  ophthalmic solution Place 1 drop into both eyes 2 (two) times daily.     [provider]  Magnesium 250 MG TABS Take 250 mg by mouth daily.    [provider]  metoprolol succinate (TOPROL-XL) 50 MG 24 hr tablet Take 1 tablet (50 mg total) by mouth daily. Take with or immediately following a meal. 10/28/20 01/26/21  Adrian Prows, MD  Multiple Vitamin (MULTIVITAMIN) capsule Take 1 capsule by mouth daily. Centrum Silver    [provider]  rosuvastatin (CRESTOR) 5 MG tablet Take 1 tablet (5 mg total) by mouth daily. 01/28/21 01/23/22   Adrian Prows, MD  sertraline (ZOLOFT) 50 MG tablet Take 1 tablet by mouth daily. 01/10/21   [provider]  traZODone (DESYREL) 100 MG tablet Take 100 mg by mouth at bedtime.  03/02/19   [provider]    Allergies    Patient has no known allergies.  Review of Systems   Review of Systems  Constitutional:  Positive for fatigue. Negative for appetite change, chills and fever.  HENT: Negative.    Respiratory:  Positive for shortness of breath.   Cardiovascular:  Positive for leg swelling.  Gastrointestinal: Negative.  Negative for diarrhea and vomiting.  Genitourinary:  Negative for dysuria.  Musculoskeletal: Negative.   Skin: Negative.   Neurological:  Positive for weakness (Generalized).   Physical Exam Updated Vital Signs BP (!) 146/84 (BP Location: Left Arm)   Pulse 68   Temp 98.4 F (36.9 C) (Oral)   Resp 18   Ht 5' (1.524 m)   Wt 98.4 kg   SpO2 95%   BMI 42.38 kg/m   Physical Exam Vitals and nursing note reviewed.  Constitutional:      Appearance: She is well-developed. She is not ill-appearing or toxic-appearing.  HENT:     Head: Normocephalic.  Cardiovascular:     Rate and Rhythm: Normal rate and regular rhythm.     Heart sounds: No murmur heard. Pulmonary:     Effort: Pulmonary effort is normal.     Breath sounds: Normal breath sounds. No wheezing, rhonchi or rales.  Abdominal:     General: Bowel sounds are normal.     Palpations: Abdomen is soft.     Tenderness: There is no abdominal tenderness. There is no guarding or rebound.  Musculoskeletal:        General: Normal range of motion.     Cervical back: Normal range of motion and neck supple.     Right lower leg: No edema.     Left lower leg: No edema.  Skin:    General: Skin is warm and dry.  Neurological:     General: No focal deficit present.     Mental Status: She is alert and oriented to person, place, and time.    ED Results / Procedures / Treatments   Labs (all labs ordered  are listed, but only abnormal results are displayed) Labs Reviewed - No data to display Results for orders placed or performed during the hospital encounter of 06/06/21  Resp Panel by RT-PCR (Flu A&B, Covid) Urine, Clean Catch   Specimen: Urine, Clean Catch; Nasopharyngeal(NP) swabs in vial transport medium  Result Value Ref Range   SARS Coronavirus 2 by RT PCR NEGATIVE NEGATIVE   Influenza A by PCR NEGATIVE NEGATIVE   Influenza B by PCR NEGATIVE NEGATIVE  CBC with Differential  Result Value Ref Range   WBC 11.8 (H) 4.0 - 10.5 K/uL   RBC 5.26 (H)  3.87 - 5.11 MIL/uL   Hemoglobin 13.5 12.0 - 15.0 g/dL   HCT 41.5 36.0 - 46.0 %   MCV 78.9 (L) 80.0 - 100.0 fL   MCH 25.7 (L) 26.0 - 34.0 pg   MCHC 32.5 30.0 - 36.0 g/dL   RDW 14.4 11.5 - 15.5 %   Platelets 336 150 - 400 K/uL   nRBC 0.0 0.0 - 0.2 %   Neutrophils Relative % 60 %   Neutro Abs 7.0 1.7 - 7.7 K/uL   Lymphocytes Relative 27 %   Lymphs Abs 3.2 0.7 - 4.0 K/uL   Monocytes Relative 10 %   Monocytes Absolute 1.2 (H) 0.1 - 1.0 K/uL   Eosinophils Relative 2 %   Eosinophils Absolute 0.3 0.0 - 0.5 K/uL   Basophils Relative 1 %   Basophils Absolute 0.1 0.0 - 0.1 K/uL   Immature Granulocytes 0 %   Abs Immature Granulocytes 0.05 0.00 - 0.07 K/uL  Comprehensive metabolic panel  Result Value Ref Range   Sodium 139 135 - 145 mmol/L   Potassium 3.9 3.5 - 5.1 mmol/L   Chloride 107 98 - 111 mmol/L   CO2 24 22 - 32 mmol/L   Glucose, Bld 121 (H) 70 - 99 mg/dL   BUN 19 8 - 23 mg/dL   Creatinine, Ser 0.93 0.44 - 1.00 mg/dL   Calcium 9.9 8.9 - 10.3 mg/dL   Total Protein 7.8 6.5 - 8.1 g/dL   Albumin 4.2 3.5 - 5.0 g/dL   AST 18 15 - 41 U/L   ALT 19 0 - 44 U/L   Alkaline Phosphatase 91 38 - 126 U/L   Total Bilirubin 0.3 0.3 - 1.2 mg/dL   GFR, Estimated >60 >60 mL/min   Anion gap 8 5 - 15  Urinalysis, Routine w reflex microscopic Urine, Clean Catch  Result Value Ref Range   Color, Urine YELLOW YELLOW   APPearance CLEAR CLEAR   Specific  Gravity, Urine 1.013 1.005 - 1.030   pH 5.0 5.0 - 8.0   Glucose, UA NEGATIVE NEGATIVE mg/dL   Hgb urine dipstick NEGATIVE NEGATIVE   Bilirubin Urine NEGATIVE NEGATIVE   Ketones, ur NEGATIVE NEGATIVE mg/dL   Protein, ur NEGATIVE NEGATIVE mg/dL   Nitrite NEGATIVE NEGATIVE   Leukocytes,Ua NEGATIVE NEGATIVE    EKG None  Radiology No results found. DG Chest Portable 1 View  Result Date: 06/06/2021 CLINICAL DATA:  Increasing shortness of breath over the past 2 days EXAM: PORTABLE CHEST 1 VIEW COMPARISON:  None. FINDINGS: Cardiac shadow is at the upper limits of normal in size. The lungs are well aerated bilaterally. No focal infiltrate or effusion is seen. No bony abnormality is noted. IMPRESSION: No active disease. Electronically Signed   By: Inez Catalina M.D.   On: 06/06/2021 23:01    Procedures Procedures   Medications Ordered in ED Medications - No data to display  ED Course  I have reviewed the triage vital signs and the nursing notes.  Pertinent labs & imaging results that were available during my care of the patient were reviewed by me and considered in my medical decision making (see chart for details).    MDM Rules/Calculators/A&P                          Patient to ED with ss/sxs as per HPI.   She is very well appearing. VSS, mildly hypertensive. Exam without acute or concerning finding, inclusive of no extremity edema at  this time.   Labs reviewed and are reassuring. CXR with infection or edema. These results were discussed with the patient and her daughter (over the phone). She is encouraged to see her doctor in the outpatient setting for further evaluation to determine cause of symptoms. Return precautions were discussed.   Final Clinical Impression(s) / ED Diagnoses Final diagnoses:  None   Malaise Fatigue   Rx / DC Orders ED Discharge Orders     None        Charlann Lange, PA-C 06/07/21 0315    Wyvonnia Dusky, MD 06/07/21 [phone]

## 2021-06-07 ENCOUNTER — Other Ambulatory Visit: Payer: Self-pay | Admitting: Student

## 2021-06-07 DIAGNOSIS — R7989 Other specified abnormal findings of blood chemistry: Secondary | ICD-10-CM

## 2021-06-07 DIAGNOSIS — R609 Edema, unspecified: Secondary | ICD-10-CM

## 2021-06-07 LAB — COMPREHENSIVE METABOLIC PANEL
ALT: 19 U/L (ref 0–44)
AST: 18 U/L (ref 15–41)
Albumin: 4.2 g/dL (ref 3.5–5.0)
Alkaline Phosphatase: 91 U/L (ref 38–126)
Anion gap: 8 (ref 5–15)
BUN: 19 mg/dL (ref 8–23)
CO2: 24 mmol/L (ref 22–32)
Calcium: 9.9 mg/dL (ref 8.9–10.3)
Chloride: 107 mmol/L (ref 98–111)
Creatinine, Ser: 0.93 mg/dL (ref 0.44–1.00)
GFR, Estimated: >60 mL/min (ref >60)
Glucose, Bld: 121 mg/dL — ABNORMAL HIGH (ref 70–99)
Potassium: 3.9 mmol/L (ref 3.5–5.1)
Sodium: 139 mmol/L (ref 135–145)
Total Bilirubin: 0.3 mg/dL (ref 0.3–1.2)
Total Protein: 7.8 g/dL (ref 6.5–8.1)

## 2021-06-07 LAB — URINALYSIS, ROUTINE W REFLEX MICROSCOPIC
Bilirubin Urine: NEGATIVE
Glucose, UA: NEGATIVE mg/dL
Hgb urine dipstick: NEGATIVE
Ketones, ur: NEGATIVE mg/dL
Leukocytes,Ua: NEGATIVE
Nitrite: NEGATIVE
Protein, ur: NEGATIVE mg/dL
Specific Gravity, Urine: 1.013 (ref 1.005–1.030)
pH: 5 (ref 5.0–8.0)

## 2021-06-07 LAB — RESP PANEL BY RT-PCR (FLU A&B, COVID) ARPGX2
Influenza A by PCR: NEGATIVE
Influenza B by PCR: NEGATIVE
SARS Coronavirus 2 by RT PCR: NEGATIVE

## 2021-06-07 NOTE — Discharge Instructions (Addendum)
Your tests tonight are all essentially normal and do not identify the cause of your progressive symptoms. Recommend follow up with your doctor this week for further outpatient testing/evaluation.   Please return to the ED if you develop any new or concerning symptoms at any time.

## 2021-06-17 ENCOUNTER — Ambulatory Visit
Admission: RE | Admit: 2021-06-17 | Discharge: 2021-06-17 | Disposition: A | Discharge status: Home / Self Care | Payer: Medicare Other | Source: Ambulatory Visit | Attending: Student | Admitting: Student

## 2021-06-17 DIAGNOSIS — R7989 Other specified abnormal findings of blood chemistry: Secondary | ICD-10-CM

## 2021-06-17 DIAGNOSIS — R609 Edema, unspecified: Secondary | ICD-10-CM

## 2021-06-29 IMAGING — MR MR BREAST*L* WO/W CM
6 of 8 series · 33 of 48 positions shown · IV contrast (9ml Gadavist)
Comparison: 05/18/2020 and 04/20/2020 MRs. Prior mammograms.

CLINICAL DATA: 69-year-old female newly diagnosed UPPER central
LEFT breast DCIS. Patient returns for possible tissue sampling of
the anterior extent of abnormal enhancement in this area identified
on 04/20/2020 MR. Patient presented on 05/18/2020 for biopsy of this
area but the procedure was canceled due to nausea.

LABS:  None performed today
EXAM:
MR OF THE LEFT BREAST WITH AND WITHOUT CONTRAST
TECHNIQUE: Multiplanar multisequence MR images of the left breast were obtained
prior to and following the intravenous administration of 9 ml of
Gadavist.

[Series 2: fiducial unilateral · sagittal · 2.0mm · 1.33mm/px · 1 of 52 slices shown]
[im 1/52]
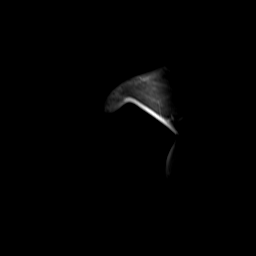

[Series 3: dynamic pre · axial · non-contrast · 1.3mm · 0.73mm/px · z∈[-64,+121]mm · 6 of 144 slices shown]
[im 1/144]
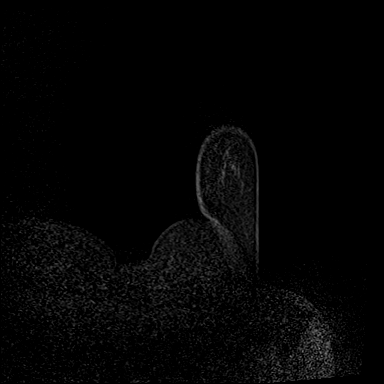
[im 29/144]
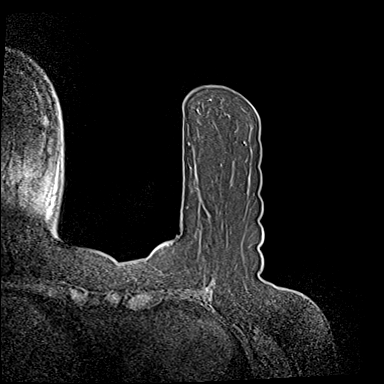
[im 58/144]
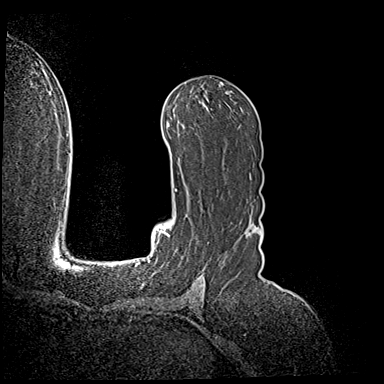
[im 86/144]
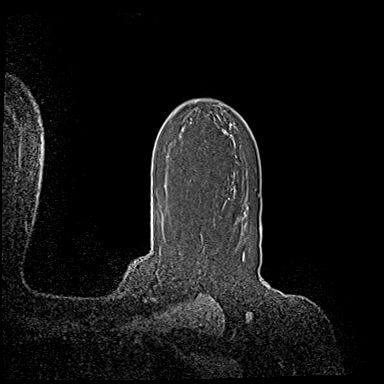
[im 115/144]
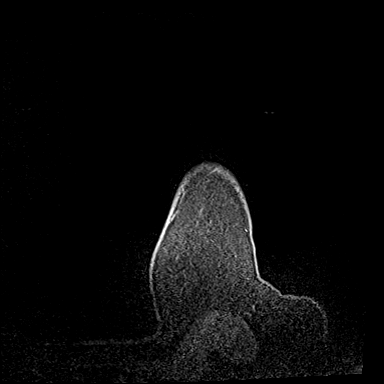
[im 144/144]
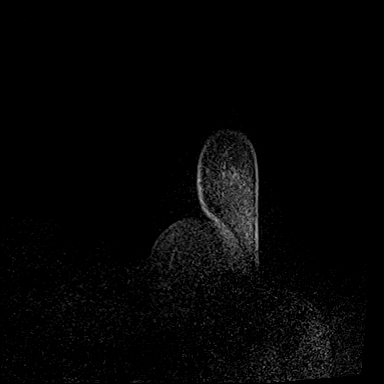

[Series 4: dynamic post 20 · axial · 1.3mm · 0.73mm/px · z∈[-64,+121]mm · 6 of 144 slices shown (1 of 2)]
[im 1/144]
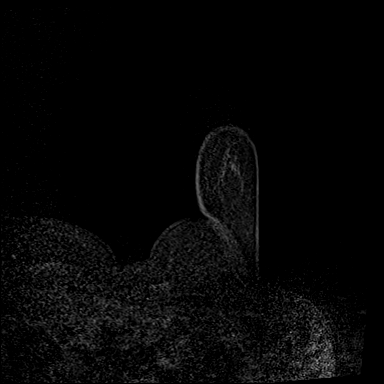
[im 29/144]
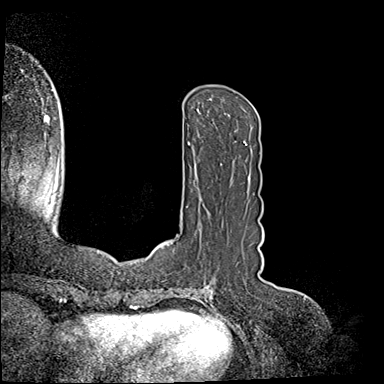
[im 58/144]
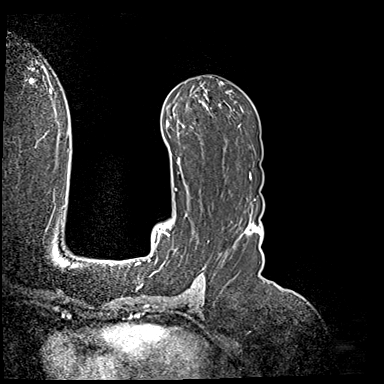
[im 86/144]
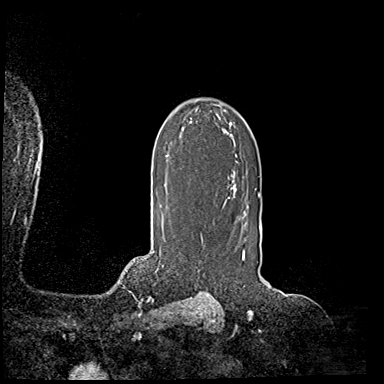
[im 115/144]
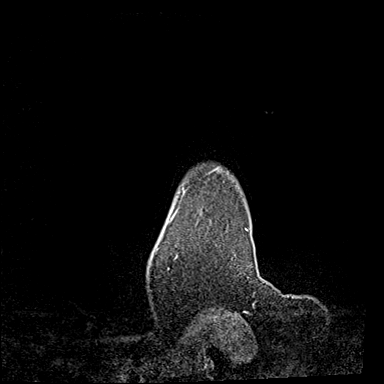
[im 144/144]
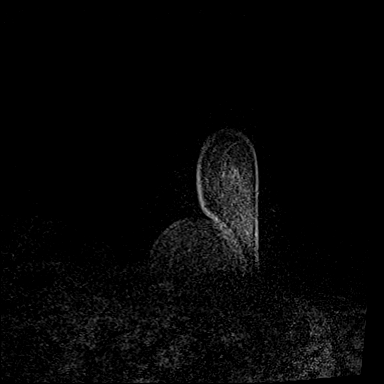

[Series 5: dynamic post 20 · axial · 1.3mm · 0.73mm/px · z∈[-64,+121]mm · 7 of 144 slices shown (2 of 2)]
[im 1/144]
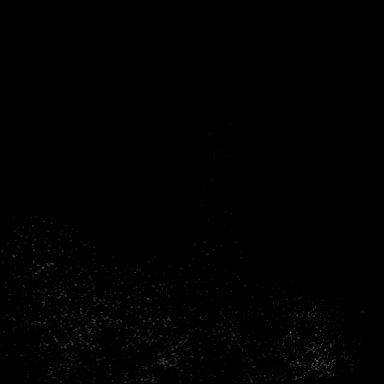
[im 24/144]
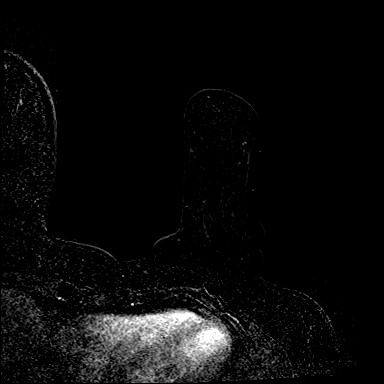
[im 48/144]
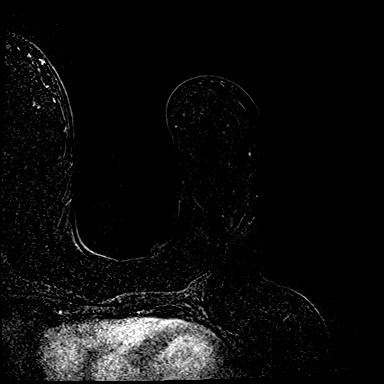
[im 72/144]
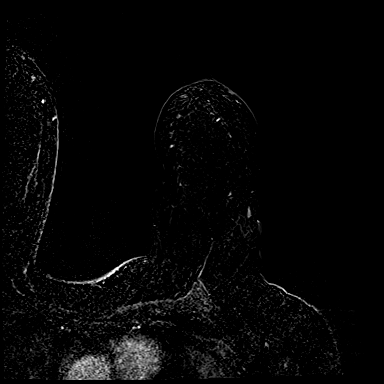
[im 96/144]
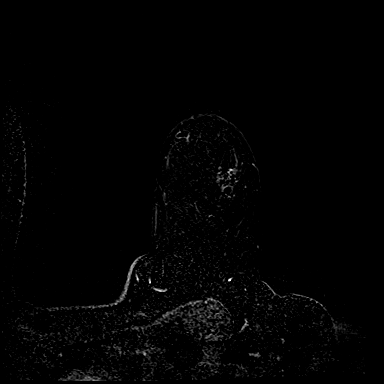
[im 120/144]
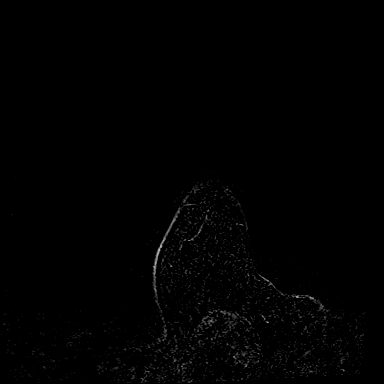
[im 144/144]
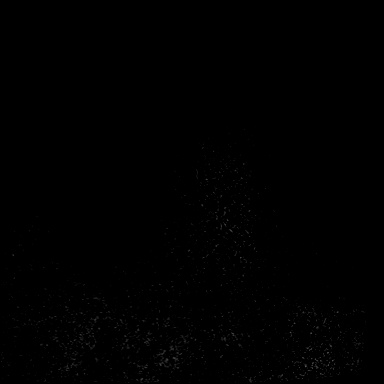

[Series 6: dynamic post 3 · axial · 1.3mm · 0.73mm/px · z∈[-64,+121]mm · 7 of 144 slices shown (1 of 2)]
[im 1/144]
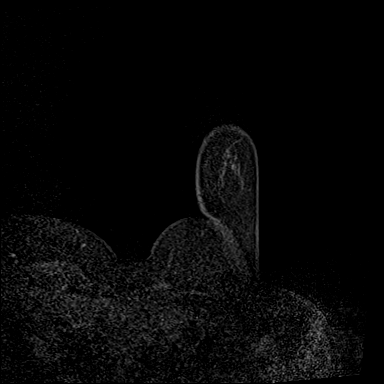
[im 24/144]
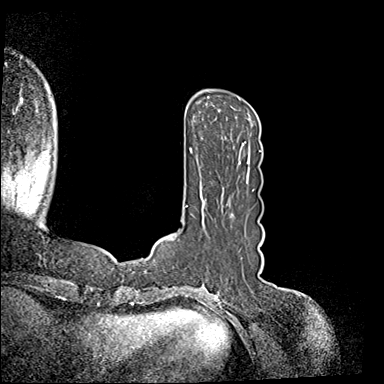
[im 48/144]
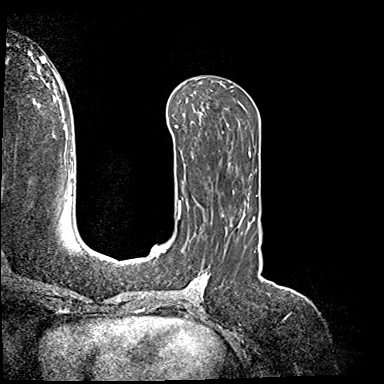
[im 72/144]
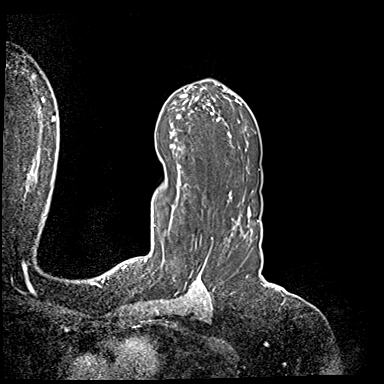
[im 96/144]
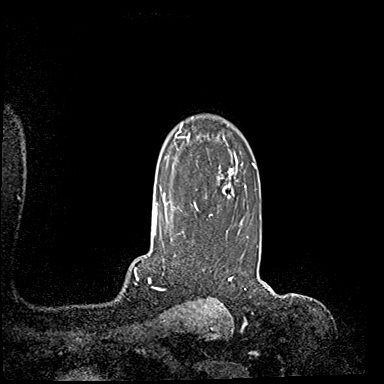
[im 120/144]
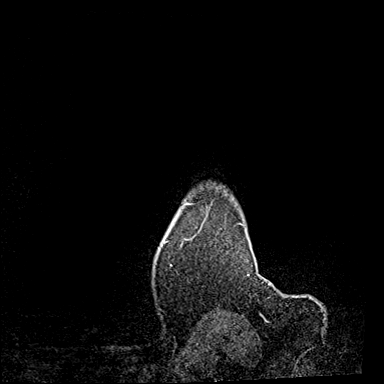
[im 144/144]
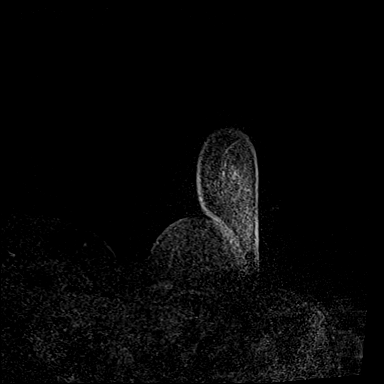

[Series 7: dynamic post 3 · axial · 1.3mm · 0.73mm/px · z∈[-64,+90]mm · 6 of 144 slices shown (2 of 2)]
[im 1/144]
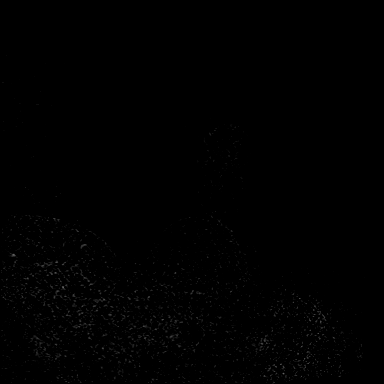
[im 24/144]
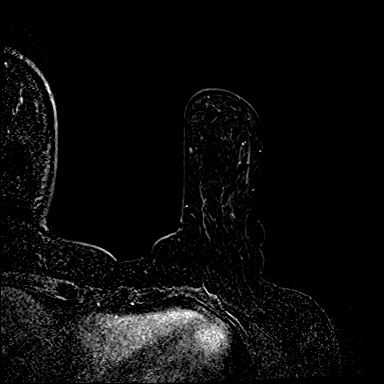
[im 48/144]
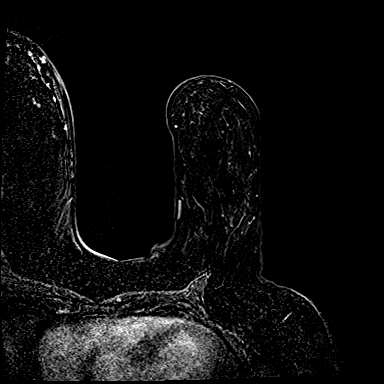
[im 72/144]
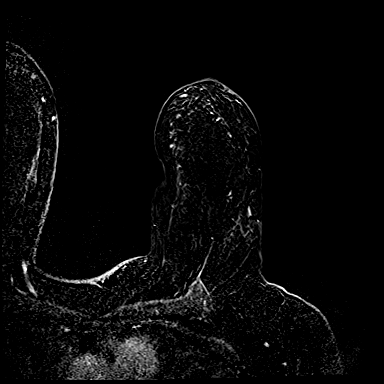
[im 96/144]
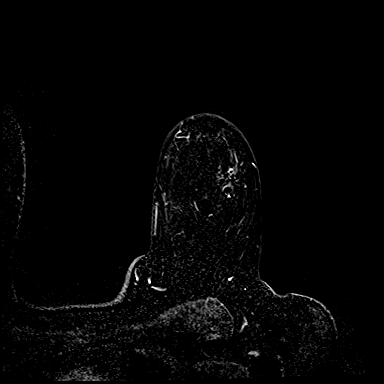
[im 120/144]
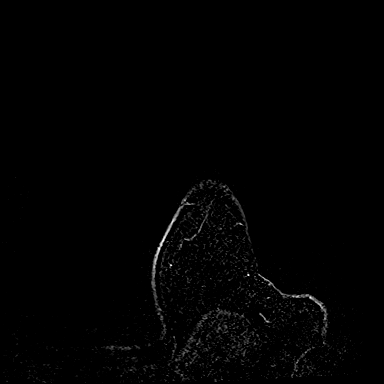

[33 of 48 positions shown; findings below may reference images not displayed]

Three-dimensional MR images were rendered by post-processing of the
original MR data on an independent workstation. The
three-dimensional MR images were interpreted, and findings are
reported in the following complete MRI report for this study. Three
dimensional images were evaluated at the independent [employer]
workstation
FINDINGS: Breast composition: b. Scattered fibroglandular tissue.

Background parenchymal enhancement: Mild

Left breast: Biopsy clip within the central UPPER LEFT breast is
identified at the site of biopsy-proven DCIS. The only abnormal non
masslike enhancement anterior to the biopsy clip artifact only
extends 0.8 cm anteriorly (image 47: Series 5). This had a similar
appearance to 05/18/2020. Given close proximity (1 cm or less) of
possible extent of disease to the biopsy clip artifact, no procedure
was performed.

Scattered foci throughout the LEFT breast identified, but similar
appearance to the RIGHT side.

Biopsy clip artifact and minimal post biopsy changes within the
posterior UPPER-OUTER LEFT breast is identified at site of
biopsy-proven benign breast tissue.

Lymph nodes: No abnormal appearing lymph nodes.

Ancillary findings:  None.
IMPRESSION: The only possible suspicious enhancement anterior to the biopsy clip
(at the site of biopsy-proven UPPER LEFT central DCIS) only extends
0.8 cm anterior to the biopsy clip artifact. Given close proximity
of farther extent of disease to biopsy-proven DCIS (less than 1 cm),
the MR guided LEFT breast biopsy was not performed. On today's and
05/18/2020 images, the possible extent of disease maximally measures
2.3 cm.

RECOMMENDATION:
Treatment plan

BI-RADS CATEGORY  6: Known biopsy-proven malignancy.

## 2021-07-26 ENCOUNTER — Encounter: Payer: Medicare Other | Attending: Student | Admitting: Dietician

## 2021-08-10 ENCOUNTER — Other Ambulatory Visit: Payer: Self-pay | Admitting: Cardiology

## 2021-08-10 DIAGNOSIS — I10 Essential (primary) hypertension: Secondary | ICD-10-CM

## 2021-09-15 ENCOUNTER — Emergency Department (HOSPITAL_BASED_OUTPATIENT_CLINIC_OR_DEPARTMENT_OTHER): Admit: 2021-09-15 | Discharge: 2021-09-15 | Disposition: A | Discharge status: Home / Self Care | Payer: Medicare Other

## 2021-09-15 ENCOUNTER — Emergency Department (HOSPITAL_COMMUNITY)
Admission: EM | Admit: 2021-09-15 | Discharge: 2021-09-15 | Disposition: A | Discharge status: Home / Self Care | Payer: Medicare Other | Attending: Emergency Medicine | Admitting: Emergency Medicine

## 2021-09-15 ENCOUNTER — Encounter (HOSPITAL_COMMUNITY): Payer: Self-pay | Admitting: Emergency Medicine

## 2021-09-15 ENCOUNTER — Emergency Department (HOSPITAL_COMMUNITY): Payer: Medicare Other

## 2021-09-15 DIAGNOSIS — M79672 Pain in left foot: Secondary | ICD-10-CM | POA: Insufficient documentation

## 2021-09-15 DIAGNOSIS — R609 Edema, unspecified: Secondary | ICD-10-CM | POA: Diagnosis not present

## 2021-09-15 DIAGNOSIS — Z79899 Other long term (current) drug therapy: Secondary | ICD-10-CM | POA: Insufficient documentation

## 2021-09-15 DIAGNOSIS — M25472 Effusion, left ankle: Secondary | ICD-10-CM

## 2021-09-15 DIAGNOSIS — Z853 Personal history of malignant neoplasm of breast: Secondary | ICD-10-CM | POA: Insufficient documentation

## 2021-09-15 DIAGNOSIS — M7989 Other specified soft tissue disorders: Secondary | ICD-10-CM | POA: Diagnosis present

## 2021-09-15 DIAGNOSIS — X501XXA Overexertion from prolonged static or awkward postures, initial encounter: Secondary | ICD-10-CM | POA: Insufficient documentation

## 2021-09-15 DIAGNOSIS — I10 Essential (primary) hypertension: Secondary | ICD-10-CM | POA: Diagnosis not present

## 2021-09-15 NOTE — ED Provider Notes (Signed)
Emergency Medicine Provider Triage Evaluation Note  [facility] , a 70 y.o. female  was evaluated in triage.  Pt complains of ankle swelling and pain.  Symptoms have been present over the last 4 days.  Patient denies any known falls or injuries.  Review of Systems  Positive: Left ankle swelling and tenderness Negative: Numbness, weakness, rash, wound  Physical Exam  BP (!) 149/77   Pulse 64   Temp 98.2 F (36.8 C) (Oral)   Resp 18   SpO2 97%  Gen:   Awake, no distress   Resp:  Normal effort  MSK:   Moves extremities without difficulty  Other:  Patient has swelling to left lower leg and left ankle.  Tenderness to left lateral malleolus.  Pulse, motor, and sensation intact distally  Medical Decision Making  Medically screening exam initiated at 5:25 PM.  Appropriate orders placed.  [facility] was informed that the remainder of the evaluation will be completed by another provider, this initial triage assessment does not replace that evaluation, and the importance of remaining in the ED until their evaluation is complete.     Loni Beckwith, PA-C 09/15/21 1726    Malvin Johns, MD 09/15/21 2205

## 2021-09-15 NOTE — ED Provider Notes (Signed)
[facility] DEPT Provider Note   CSN: [id number] Arrival date & time: 09/15/21  1645     History Chief Complaint  Patient presents with   Leg Pain    Deanna Potter is a 70 y.o. female.  70 year old female presents with 4 days of left lower extremity swelling mostly around her left ankle.  Thinks that she may have twisted it.  Denies any chest pain or shortness of breath.  No prior history of DVT.  No pain in her hip.  Denies any distal numbness or tingling to her left foot.  No treatment use prior to arrival      Past Medical History:  Diagnosis Date   Anxiety    Cancer ([place]) 03/21/2020   Breast Cancer   Cardiomegaly 03/13/2019   HTN (hypertension)     Patient Active Problem List   Diagnosis Date Noted   Hx of radiation therapy 11/02/2020   S/P bilateral breast reduction 07/27/2020   Malignant neoplasm of upper-outer quadrant of left breast in female, estrogen receptor positive ([place]) 04/05/2020   Cardiomegaly 03/13/2019   Essential hypertension 03/13/2019   OSA on CPAP 03/13/2019    Past Surgical History:  Procedure Laterality Date   BREAST LUMPECTOMY WITH RADIOACTIVE SEED LOCALIZATION Left 07/19/2020   Procedure: LEFT BREAST LUMPECTOMY WITH RADIOACTIVE SEED LOCALIZATION;  Surgeon: Jovita Kussmaul, MD;  Location: [facility];  Service: General;  Laterality: Left;   BREAST REDUCTION SURGERY Bilateral 07/19/2020   Procedure: BILATERAL MAMMARY REDUCTION  (BREAST) ONCO PLASTIC FOR LEFT BREAST CANCER;  Surgeon: Wallace Going, DO;  Location: [facility];  Service: Plastics;  Laterality: Bilateral;   EYE SURGERY Bilateral    PARTIAL HYSTERECTOMY       OB History   No obstetric history on file.     Family History  Problem Relation Age of Onset   Stomach cancer Brother     Social History   Tobacco Use   Smoking status: Never   Smokeless tobacco: Never  Vaping Use   Vaping Use: Never used  Substance Use Topics   Alcohol use: Not Currently    Drug use: Never    Home Medications Prior to Admission medications   Medication Sig Start Date End Date Taking? Authorizing Provider  amLODipine (NORVASC) 5 MG tablet Take 5 mg by mouth daily. 06/16/20   [provider]  B Complex Vitamins (VITAMIN B COMPLEX PO) Take 1 tablet by mouth daily.    [provider]  Cholecalciferol (DIALYVITE VITAMIN D 5000 PO) Take 5,000 mg by mouth daily.    [provider]  [facility] Liver Oil CAPS Take 1 capsule by mouth daily. Plus A&D    [provider]  escitalopram (LEXAPRO) 20 MG tablet Take 20 mg by mouth daily. 01/31/19   [provider]  gabapentin (NEURONTIN) 100 MG capsule Take 100 mg by mouth 3 (three) times daily. 03/09/20   [provider]  HYDROcodone-acetaminophen (NORCO/VICODIN) 5-325 MG tablet Take 1-2 tablets by mouth every 6 (six) hours as needed for moderate pain or severe pain. 07/19/20   Autumn Messing III, MD  hydrOXYzine (ATARAX/VISTARIL) 50 MG tablet Take 50 mg by mouth 3 (three) times daily. 06/16/20   [provider]  latanoprost (XALATAN) 0.005 % ophthalmic solution Place 1 drop into both eyes 2 (two) times daily.     [provider]  Magnesium 250 MG TABS Take 250 mg by mouth daily.    [provider]  metoprolol succinate (TOPROL-XL) 50  MG 24 hr tablet Take 1 tablet by mouth every day with a meal 08/10/21   Adrian Prows, MD  Multiple Vitamin (MULTIVITAMIN) capsule Take 1 capsule by mouth daily. Centrum Silver    [provider]  rosuvastatin (CRESTOR) 5 MG tablet Take 1 tablet (5 mg total) by mouth daily. 01/28/21 01/23/22  Adrian Prows, MD  sertraline (ZOLOFT) 50 MG tablet Take 1 tablet by mouth daily. 01/10/21   [provider]  traZODone (DESYREL) 100 MG tablet Take 100 mg by mouth at bedtime.  03/02/19   [provider]    Allergies    Patient has no known allergies.  Review of Systems   Review of Systems  All other systems reviewed and are  negative.  Physical Exam Updated Vital Signs BP (!) 149/77   Pulse 64   Temp 98.2 F (36.8 C) (Oral)   Resp 18   SpO2 97%   Physical Exam Vitals and nursing note reviewed.  Constitutional:      General: She is not in acute distress.    Appearance: Normal appearance. She is well-developed. She is not toxic-appearing.  HENT:     Head: Normocephalic and atraumatic.  Eyes:     General: Lids are normal.     Conjunctiva/sclera: Conjunctivae normal.     Pupils: Pupils are equal, round, and reactive to light.  Neck:     Thyroid: No thyroid mass.     Trachea: No tracheal deviation.  Cardiovascular:     Rate and Rhythm: Normal rate and regular rhythm.     Heart sounds: Normal heart sounds. No murmur heard.   No gallop.  Pulmonary:     Effort: Pulmonary effort is normal. No respiratory distress.     Breath sounds: Normal breath sounds. No stridor. No decreased breath sounds, wheezing, rhonchi or rales.  Abdominal:     General: There is no distension.     Palpations: Abdomen is soft.     Tenderness: There is no abdominal tenderness. There is no rebound.  Musculoskeletal:        General: No tenderness. Normal range of motion.     Cervical back: Normal range of motion and neck supple.     Comments: Edema noted to dorsum of left foot as well as ankle.  Neurovascular intact at left foot.  No calf tenderness.  Normal range of motion at her left knee.  No thigh tenderness  Skin:    General: Skin is warm and dry.     Findings: No abrasion or rash.  Neurological:     Mental Status: She is alert and oriented to person, place, and time. Mental status is at baseline.     GCS: GCS eye subscore is 4. GCS verbal subscore is 5. GCS motor subscore is 6.     Cranial Nerves: No cranial nerve deficit.     Sensory: No sensory deficit.     Motor: Motor function is intact.  Psychiatric:        Attention and Perception: Attention normal.        Speech: Speech normal.        Behavior: Behavior  normal.    ED Results / Procedures / Treatments   Labs (all labs ordered are listed, but only abnormal results are displayed) Labs Reviewed - No data to display  EKG None  Radiology No results found.  Procedures Procedures   Medications Ordered in ED Medications - No data to display  ED Course  I have reviewed  the triage vital signs and the nursing notes.  Pertinent labs & imaging results that were available during my care of the patient were reviewed by me and considered in my medical decision making (see chart for details).    MDM Rules/Calculators/A&P                           With negative Doppler of her leg as well as negative ankle x-ray except for some soft tissue swelling.  Patient thinks she may have twisted her ankle.  We will place an ASO and discharged home Final Clinical Impression(s) / ED Diagnoses Final diagnoses:  None    Rx / DC Orders ED Discharge Orders     None        Lacretia Leigh, MD 09/15/21 2008

## 2021-09-15 NOTE — Progress Notes (Signed)
Left lower extremity venous duplex completed. Refer to "CV Proc" under chart review to view preliminary results.  09/15/2021 7:00 PM Kelby Aline., MHA, RVT, RDCS, RDMS

## 2021-09-15 NOTE — ED Triage Notes (Signed)
Pt BIB EMS from [place] clinic c/o swelling and pain in left ankle/foot x 4 days. Denies injury to area. MD that seen pt today is concerned for blood clot. Pt hypertensive with hx.

## 2021-10-28 ENCOUNTER — Ambulatory Visit: Payer: Medicare Other | Admitting: Obstetrics and Gynecology

## 2021-10-28 NOTE — Progress Notes (Deleted)
[facility] New Patient Evaluation and Consultation  Referring Provider: Jaynee Eagles, PA-C PCP: Jaynee Eagles, PA-C Date of Service: 10/28/2021  SUBJECTIVE Chief Complaint: No chief complaint on file.  History of Present Illness: Deanna Potter is a 70 y.o. {ED SANE [phone] female seen in consultation at the request of Dr. Bess Harvest for evaluation of ***.    ***Review of records significant for: ***  Urinary Symptoms: {urine leakage?:24754} Leaks *** time(s) per {days/wks/mos/yrs:310907}.  Pad use: {NUMBERS 1-10:18281} {pad option:24752} per day.   She {ACTION; IS/IS VQM:08676195} bothered by her UI symptoms.  Day time voids ***.  Nocturia: *** times per night to void. Voiding dysfunction: she {empties:24755} her bladder well.  {DOES NOT does:27190::"does not"} use a catheter to empty bladder.  When urinating, she feels {urine symptoms:24756} Drinks: *** per day  UTIs: {NUMBERS 1-10:18281} UTI's in the last year.   {ACTIONS;DENIES/REPORTS:21021675::"Denies"} history of {urologic concerns:24757}  Pelvic Organ Prolapse Symptoms:                  She {denies/ admits to:24761} a feeling of a bulge the vaginal area. It has been present for {NUMBER 1-10:22536} {days/wks/mos/yrs:310907}.  She {denies/ admits to:24761} seeing a bulge.  This bulge {ACTION; IS/IS KDT:26712458} bothersome.  Bowel Symptom: Bowel movements: *** time(s) per {Time; day/week/month:13537} Stool consistency: {stool consistency:24758} Straining: {yes/no:19897}.  Splinting: {yes/no:19897}.  Incomplete evacuation: {yes/no:19897}.  She {denies/ admits to:24761} accidental bowel leakage / fecal incontinence  Occurs: *** time(s) per {Time; day/week/month:13537}  Consistency with leakage: {stool consistency:24758} Bowel regimen: {bowel regimen:24759} Last colonoscopy: Date ***, Results ***  Sexual Function Sexually active: {yes/no:19897}.  Sexual orientation: {Sexual [phone]} Pain with  sex: {pain with sex:24762}  Pelvic Pain {denies/ admits to:24761} pelvic pain Location: *** Pain occurs: *** Prior pain treatment: *** Improved by: *** Worsened by: ***   Past Medical History:  Past Medical History:  Diagnosis Date   Anxiety    Cancer ([place]) 03/21/2020   Breast Cancer   Cardiomegaly 03/13/2019   HTN (hypertension)      Past Surgical History:   Past Surgical History:  Procedure Laterality Date   BREAST LUMPECTOMY WITH RADIOACTIVE SEED LOCALIZATION Left 07/19/2020   Procedure: LEFT BREAST LUMPECTOMY WITH RADIOACTIVE SEED LOCALIZATION;  Surgeon: Jovita Kussmaul, MD;  Location: [facility];  Service: General;  Laterality: Left;   BREAST REDUCTION SURGERY Bilateral 07/19/2020   Procedure: BILATERAL MAMMARY REDUCTION  (BREAST) ONCO PLASTIC FOR LEFT BREAST CANCER;  Surgeon: Wallace Going, DO;  Location: [facility];  Service: Plastics;  Laterality: Bilateral;   EYE SURGERY Bilateral    PARTIAL HYSTERECTOMY       Past OB/GYN History: G{NUMBERS 1-10:18281} P{NUMBERS 1-10:18281} Vaginal deliveries: ***,  Forceps/ Vacuum deliveries: ***, Cesarean section: *** Menopausal: {menopausal:24763} Contraception: ***. Last pap smear was ***.  Any history of abnormal pap smears: {yes/no:19897}.   Medications: She has a current medication list which includes the following prescription(s): amlodipine, b complex vitamins, cholecalciferol, cod liver oil, escitalopram, gabapentin, hydrocodone-acetaminophen, hydroxyzine, latanoprost, magnesium, metoprolol succinate, multivitamin, rosuvastatin, sertraline, and trazodone.   Allergies: Patient has No Known Allergies.   Social History:  Social History   Tobacco Use   Smoking status: Never   Smokeless tobacco: Never  Vaping Use   Vaping Use: Never used  Substance Use Topics   Alcohol use: Not Currently   Drug use: Never    Relationship status: {relationship status:24764} She lives with ***.   She {ACTION; IS/IS KDX:83382505}  employed ***. Regular exercise: {Yes/No:304960894} History of abuse: {Yes/No:304960894}  Family History:   Family History  Problem Relation Age of Onset   Stomach cancer Brother      Review of Systems: ROS   OBJECTIVE Physical Exam: There were no vitals filed for this visit.  Physical Exam   GU / Detailed Urogynecologic Evaluation:  Pelvic Exam: Normal external female genitalia; Bartholin's and Skene's glands normal in appearance; urethral meatus normal in appearance, no urethral masses or discharge.   CST: {gen negative/positive:315881}  Reflexes: bulbocavernosis {DESC; PRESENT/NOT PRESENT:21021351}, anocutaneous {DESC; PRESENT/NOT PRESENT:21021351} ***bilaterally.  Speculum exam reveals normal vaginal mucosa {With/Without:20273} atrophy. Cervix {exam; gyn cervix:30847}. Uterus {exam; pelvic uterus:30849}. Adnexa {exam; adnexa:12223}.    s/p hysterectomy: Speculum exam reveals normal vaginal mucosa {With/Without:20273}  atrophy and normal vaginal cuff.  Adnexa {exam; adnexa:12223}.    With apex supported, anterior compartment defect was {reduced:24765}  Pelvic floor strength {Roman # I-V:19040}/V, puborectalis {Roman # I-V:19040}/V external anal sphincter {Roman # I-V:19040}/V  Pelvic floor musculature: Right levator {Tender/Non-tender:20250}, Right obturator {Tender/Non-tender:20250}, Left levator {Tender/Non-tender:20250}, Left obturator {Tender/Non-tender:20250}  POP-Q:   POP-Q                                               Aa                                               Ba                                                 C                                                Gh                                               Pb                                               tvl                                                Ap                                               Bp  D     Rectal Exam:  Normal  sphincter tone, {rectocele:24766} distal rectocele, enterocoele {DESC; PRESENT/NOT PRESENT:21021351}, no rectal masses, {sign of:24767} dyssynergia when asking the patient to bear down.  Post-Void Residual (PVR) by Bladder Scan: In order to evaluate bladder emptying, we discussed obtaining a postvoid residual and she agreed to this procedure.  Procedure: The ultrasound unit was placed on the patient's abdomen in the suprapubic region after the patient had voided. A PVR of *** ml was obtained by bladder scan.  Laboratory Results: @ENCLABS @   ***I visualized the urine specimen, noting the specimen to be {urine color:24768}  ASSESSMENT AND PLAN Ms. Zukas is a 70 y.o. with: No diagnosis found.    Jaquita Folds, MD   Medical Decision Making:  - Reviewed/ ordered a clinical laboratory test - Reviewed/ ordered a radiologic study - Reviewed/ ordered medicine test - Decision to obtain old records - Discussion of management of or test interpretation with an external physician / other healthcare professional  - Assessment requiring independent historian - Review and summation of prior records - Independent review of image, tracing or specimen

## 2021-11-29 ENCOUNTER — Other Ambulatory Visit: Payer: Self-pay | Admitting: Cardiology

## 2021-11-29 DIAGNOSIS — E785 Hyperlipidemia, unspecified: Secondary | ICD-10-CM

## 2021-12-14 ENCOUNTER — Encounter (HOSPITAL_COMMUNITY): Payer: Self-pay

## 2021-12-30 ENCOUNTER — Other Ambulatory Visit: Payer: Self-pay

## 2021-12-30 ENCOUNTER — Emergency Department (HOSPITAL_COMMUNITY): Payer: Medicare Other

## 2021-12-30 ENCOUNTER — Encounter (HOSPITAL_COMMUNITY): Payer: Self-pay

## 2021-12-30 ENCOUNTER — Emergency Department (HOSPITAL_COMMUNITY)
Admission: EM | Admit: 2021-12-30 | Discharge: 2021-12-30 | Disposition: A | Discharge status: Home / Self Care | Payer: Medicare Other | Attending: Emergency Medicine | Admitting: Emergency Medicine

## 2021-12-30 DIAGNOSIS — I10 Essential (primary) hypertension: Secondary | ICD-10-CM | POA: Insufficient documentation

## 2021-12-30 DIAGNOSIS — W19XXXA Unspecified fall, initial encounter: Secondary | ICD-10-CM

## 2021-12-30 DIAGNOSIS — S0990XA Unspecified injury of head, initial encounter: Secondary | ICD-10-CM | POA: Diagnosis present

## 2021-12-30 DIAGNOSIS — W01198A Fall on same level from slipping, tripping and stumbling with subsequent striking against other object, initial encounter: Secondary | ICD-10-CM | POA: Diagnosis not present

## 2021-12-30 DIAGNOSIS — Y9301 Activity, walking, marching and hiking: Secondary | ICD-10-CM | POA: Insufficient documentation

## 2021-12-30 DIAGNOSIS — Z79899 Other long term (current) drug therapy: Secondary | ICD-10-CM | POA: Diagnosis not present

## 2021-12-30 DIAGNOSIS — Z853 Personal history of malignant neoplasm of breast: Secondary | ICD-10-CM | POA: Diagnosis not present

## 2021-12-30 DIAGNOSIS — S0093XA Contusion of unspecified part of head, initial encounter: Secondary | ICD-10-CM | POA: Diagnosis not present

## 2021-12-30 LAB — CBC WITH DIFFERENTIAL/PLATELET
Abs Immature Granulocytes: 0.05 10*3/uL (ref 0.00–0.07)
Basophils Absolute: 0.1 10*3/uL (ref 0.0–0.1)
Basophils Relative: 1 %
Eosinophils Absolute: 0.3 10*3/uL (ref 0.0–0.5)
Eosinophils Relative: 3 %
HCT: 40.3 % (ref 36.0–46.0)
Hemoglobin: 13.4 g/dL (ref 12.0–15.0)
Immature Granulocytes: 1 %
Lymphocytes Relative: 23 %
Lymphs Abs: 2.2 10*3/uL (ref 0.7–4.0)
MCH: 25.6 pg — ABNORMAL LOW (ref 26.0–34.0)
MCHC: 33.3 g/dL (ref 30.0–36.0)
MCV: 77.1 fL — ABNORMAL LOW (ref 80.0–100.0)
Monocytes Absolute: 0.7 10*3/uL (ref 0.1–1.0)
Monocytes Relative: 7 %
Neutro Abs: 6.4 10*3/uL (ref 1.7–7.7)
Neutrophils Relative %: 65 %
Platelets: 334 10*3/uL (ref 150–400)
RBC: 5.23 MIL/uL — ABNORMAL HIGH (ref 3.87–5.11)
RDW: 14.6 % (ref 11.5–15.5)
WBC: 9.8 10*3/uL (ref 4.0–10.5)
nRBC: 0 % (ref 0.0–0.2)

## 2021-12-30 LAB — BASIC METABOLIC PANEL
Anion gap: 8 (ref 5–15)
BUN: 14 mg/dL (ref 8–23)
CO2: 23 mmol/L (ref 22–32)
Calcium: 9.9 mg/dL (ref 8.9–10.3)
Chloride: 105 mmol/L (ref 98–111)
Creatinine, Ser: 1 mg/dL (ref 0.44–1.00)
GFR, Estimated: >60 mL/min (ref >60)
Glucose, Bld: 96 mg/dL (ref 70–99)
Potassium: 3.6 mmol/L (ref 3.5–5.1)
Sodium: 136 mmol/L (ref 135–145)

## 2021-12-30 MED ORDER — ACETAMINOPHEN 500 MG PO TABS
1000.0000 mg | ORAL_TABLET | Freq: Four times a day (QID) | ORAL | Status: DC | PRN
  Administered 2021-12-30: 1000 mg via ORAL
  Filled 2021-12-30: qty 2

## 2021-12-30 NOTE — Discharge Instructions (Addendum)
Please return to the ED with any new or worsening symptoms such as lethargy, confusion, lower extremity weakness, slurred words Please continue taking either ibuprofen or Tylenol for any pain to the right side of your body as a result of falling Please follow-up with the patient's PCP on Monday

## 2021-12-30 NOTE — ED Provider Notes (Signed)
[facility] DEPT Provider Note   CSN: [id number] Arrival date & time: 12/30/21  1446     History  Chief Complaint  Patient presents with   Fall   Head Injury    Deanna Potter is a 71 y.o. femalewith medical history of anxiety, hypertension, breast cancer.  Patient states that earlier today, she was in her usual state of health, unloading groceries from her car when she suffered a ground-level fall.  Patient reports that she is unsure why she fell, denies losing consciousness or blacking out.  Patient reports that she did hit her head, denies being on blood thinners.  Patient reports at this time, her daughter called EMS who transported her to the ED for further evaluation and treatment.  Patient Dors is headache, facial tenderness.  Patient denies nausea, vomiting, loss of consciousness, weakness, lightheadedness, chest pain, shortness of breath.   Fall Associated symptoms include headaches. Pertinent negatives include no chest pain and no shortness of breath.  Head Injury Associated symptoms: headache   Associated symptoms: no nausea and no vomiting       Home Medications Prior to Admission medications   Medication Sig Start Date End Date Taking? Authorizing Provider  amLODipine (NORVASC) 5 MG tablet Take 5 mg by mouth daily. 06/16/20   [provider]  B Complex Vitamins (VITAMIN B COMPLEX PO) Take 1 tablet by mouth daily.    [provider]  Cholecalciferol (DIALYVITE VITAMIN D 5000 PO) Take 5,000 mg by mouth daily.    [provider]  [facility] Liver Oil CAPS Take 1 capsule by mouth daily. Plus A&D    [provider]  escitalopram (LEXAPRO) 20 MG tablet Take 20 mg by mouth daily. 01/31/19   [provider]  gabapentin (NEURONTIN) 100 MG capsule Take 100 mg by mouth 3 (three) times daily. 03/09/20   [provider]  HYDROcodone-acetaminophen (NORCO/VICODIN) 5-325 MG tablet Take 1-2 tablets by mouth every 6  (six) hours as needed for moderate pain or severe pain. 07/19/20   Autumn Messing III, MD  hydrOXYzine (ATARAX/VISTARIL) 50 MG tablet Take 50 mg by mouth 3 (three) times daily. 06/16/20   [provider]  latanoprost (XALATAN) 0.005 % ophthalmic solution Place 1 drop into both eyes 2 (two) times daily.     [provider]  Magnesium 250 MG TABS Take 250 mg by mouth daily.    [provider]  metoprolol succinate (TOPROL-XL) 50 MG 24 hr tablet Take 1 tablet by mouth every day with a meal 08/10/21   Adrian Prows, MD  Multiple Vitamin (MULTIVITAMIN) capsule Take 1 capsule by mouth daily. Centrum Silver    [provider]  rosuvastatin (CRESTOR) 5 MG tablet Take 1 tablet by mouth every day 11/29/21   Adrian Prows, MD  sertraline (ZOLOFT) 50 MG tablet Take 1 tablet by mouth daily. 01/10/21   [provider]  traZODone (DESYREL) 100 MG tablet Take 100 mg by mouth at bedtime.  03/02/19   [provider]      Allergies    Patient has no known allergies.    Review of Systems   Review of Systems  Respiratory:  Negative for shortness of breath.   Cardiovascular:  Negative for chest pain.  Gastrointestinal:  Negative for nausea and vomiting.  Skin:        Bruising over right eye  Neurological:  Positive for headaches. Negative for weakness and light-headedness.  All other systems reviewed and are negative.  Physical  Exam Updated Vital Signs BP 135/75 (BP Location: Left Arm)    Pulse 60    Temp 98.6 F (37 C) (Oral)    Resp 16    Ht 5' (1.524 m)    Wt 98.4 kg    SpO2 97%    BMI 42.38 kg/m  Physical Exam Constitutional:      General: She is not in acute distress.    Appearance: Normal appearance. She is not ill-appearing or toxic-appearing.  HENT:     Head: Normocephalic.     Comments: Soft tissue swelling/hematoma noted over right    Nose: Nose normal.     Mouth/Throat:     Mouth: Mucous membranes are moist.  Eyes:     Extraocular Movements:  Extraocular movements intact.     Pupils: Pupils are equal, round, and reactive to light.  Cardiovascular:     Rate and Rhythm: Normal rate and regular rhythm.  Pulmonary:     Effort: Pulmonary effort is normal.     Breath sounds: Normal breath sounds. No stridor. No wheezing, rhonchi or rales.  Abdominal:     General: Abdomen is flat.     Palpations: Abdomen is soft.     Tenderness: There is no abdominal tenderness.  Musculoskeletal:     Cervical back: Normal range of motion. No rigidity or tenderness.  Skin:    General: Skin is warm and dry.     Capillary Refill: Capillary refill takes less than 2 seconds.  Neurological:     General: No focal deficit present.     Mental Status: She is alert and oriented to person, place, and time.     GCS: GCS eye subscore is 4. GCS verbal subscore is 5. GCS motor subscore is 6.     Cranial Nerves: Cranial nerves 2-12 are intact. No cranial nerve deficit or dysarthria.     Sensory: Sensation is intact. No sensory deficit.     Motor: Motor function is intact. No weakness.     Coordination: Coordination is intact. Coordination normal. Heel to Shin Test normal.    ED Results / Procedures / Treatments   Labs (all labs ordered are listed, but only abnormal results are displayed) Labs Reviewed  CBC WITH DIFFERENTIAL/PLATELET - Abnormal; Notable for the following components:      Result Value   RBC 5.23 (*)    MCV 77.1 (*)    MCH 25.6 (*)    All other components within normal limits  BASIC METABOLIC PANEL    EKG None  Radiology DG Shoulder Right  Result Date: 12/30/2021 CLINICAL DATA:  Insert his EXAM: RIGHT SHOULDER - 2+ VIEW COMPARISON:  None. FINDINGS: There is no evidence of acute fracture or dislocation. There is moderate glenohumeral and mild AC joint osteoarthritis. Ossification of the coracoacromial ligament. IMPRESSION: No acute fracture or dislocation. 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  Electronically Signed   By: Maurine Simmering M.D.   On: 12/30/2021 16:15   CT Head Wo Contrast  Result Date: 12/30/2021 CLINICAL DATA:  Fall walking to her car. EXAM: CT HEAD WITHOUT CONTRAST CT CERVICAL SPINE WITHOUT CONTRAST TECHNIQUE: Multidetector CT imaging of the head and cervical spine was performed following the standard protocol without intravenous contrast. Multiplanar CT image reconstructions of the cervical spine were also generated. RADIATION DOSE REDUCTION: This exam was performed according to the departmental dose-optimization program which includes automated exposure control, adjustment of the mA and/or kV according to patient size and/or use of iterative reconstruction technique.  COMPARISON:  None. FINDINGS: CT HEAD FINDINGS Brain: No evidence of acute infarction, hemorrhage, hydrocephalus, extra-axial collection or mass lesion/mass effect. Cerebral volume is within normal limits for patient's age. Vascular: No hyperdense vessel or unexpected calcification. Skull: Normal. Negative for fracture or focal lesion. Sinuses/Orbits: No acute finding. Other: Small right supraorbital frontal scalp hematoma. CT CERVICAL SPINE FINDINGS Alignment: No traumatic malalignment. Skull base and vertebrae: No acute fracture. No primary bone lesion or focal pathologic process. Soft tissues and spinal canal: No prevertebral fluid or swelling. No visible canal hematoma. Disc levels: Mild-to-moderate disc height loss and uncovertebral hypertrophy at C5-C6 and C6-C7. Diffuse moderate facet arthropathy throughout the cervical spine with bilateral facet joint ankylosis at C2-C3 and C4-C5. Upper chest: Negative. Other: None. IMPRESSION: 1. No acute intracranial abnormality. Small right supraorbital frontal scalp hematoma. 2. No acute cervical spine fracture or traumatic malalignment. Electronically Signed   By: Titus Dubin M.D.   On: 12/30/2021 16:28   CT Cervical Spine Wo Contrast  Result Date: 12/30/2021 CLINICAL DATA:   Fall walking to her car. EXAM: CT HEAD WITHOUT CONTRAST CT CERVICAL SPINE WITHOUT CONTRAST TECHNIQUE: Multidetector CT imaging of the head and cervical spine was performed following the standard protocol without intravenous contrast. Multiplanar CT image reconstructions of the cervical spine were also generated. RADIATION DOSE REDUCTION: This exam was performed according to the departmental dose-optimization program which includes automated exposure control, adjustment of the mA and/or kV according to patient size and/or use of iterative reconstruction technique. COMPARISON:  None. FINDINGS: CT HEAD FINDINGS Brain: No evidence of acute infarction, hemorrhage, hydrocephalus, extra-axial collection or mass lesion/mass effect. Cerebral volume is within normal limits for patient's age. Vascular: No hyperdense vessel or unexpected calcification. Skull: Normal. Negative for fracture or focal lesion. Sinuses/Orbits: No acute finding. Other: Small right supraorbital frontal scalp hematoma. CT CERVICAL SPINE FINDINGS Alignment: No traumatic malalignment. Skull base and vertebrae: No acute fracture. No primary bone lesion or focal pathologic process. Soft tissues and spinal canal: No prevertebral fluid or swelling. No visible canal hematoma. Disc levels: Mild-to-moderate disc height loss and uncovertebral hypertrophy at C5-C6 and C6-C7. Diffuse moderate facet arthropathy throughout the cervical spine with bilateral facet joint ankylosis at C2-C3 and C4-C5. Upper chest: Negative. Other: None. IMPRESSION: 1. No acute intracranial abnormality. Small right supraorbital frontal scalp hematoma. 2. No acute cervical spine fracture or traumatic malalignment. Electronically Signed   By: Titus Dubin M.D.   On: 12/30/2021 16:28   DG Hand Complete Right  Result Date: 12/30/2021 CLINICAL DATA:  pain after fall EXAM: RIGHT HAND - COMPLETE 3+ VIEW COMPARISON:  None. FINDINGS: There is no evidence of acute fracture or dislocation.  There is severe first carpometacarpal joint osteoarthritis. There is mild-to-moderate diffuse interphalangeal joint osteoarthritis. No osseous erosions. Radiocarpal, TFCC, and intercarpal chondrocalcinosis. IMPRESSION: No acute osseous abnormality. 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 Electronically Signed   By: Maurine Simmering M.D.   On: 12/30/2021 16:17    Procedures Procedures   Medications Ordered in ED Medications  acetaminophen (TYLENOL) tablet 1,000 mg (1,000 mg Oral Given 12/30/21 1801)    ED Course/ Medical Decision Making/ A&P                           Medical Decision Making Amount and/or Complexity of Data Reviewed Labs: ordered. Radiology: ordered.  Risk OTC drugs.   71 year old female presents after  suffering ground-level fall.  On examination, patient is afebrile, nontachycardic, nonhypoxic, alert and oriented x3, clear lung sounds, no focal deficits noted on her neuro exam.  Patient complaining of headache.  Patient has moderately sized soft tissue swelling/hematoma over right eye.  Patient has no pain when assessing her extraocular movement.  Following labs were ordered by me and interpreted by me personally: CT head, CT C-spine, right shoulder x-ray, right wrist x-ray, CBC, BMP CT head: No intracranial abnormality noted CT C-spine: No fracture noted.  No deformity Right shoulder x-ray: No fracture, dislocation.  Osteoarthritis of the glenohumeral joint and acromioclavicular joint noted Right wrist x-ray: No fracture or dislocation noted.  Severe first metacarpal joint osteoarthritis noted. CBC: Unremarkable BMP: Unremarkable  At this time, I feel this patient is stable for discharge.  She does not have any focal neurodeficits, CT of her head shows no intracranial abnormality.  The patient's daughter was at bedside, she voiced concern for her  mother falling.  I advised the daughter and her mother that they need to follow-up with the patient's PCP on Monday for further evaluation management/investigation of patient falling.  Patient only complaining of headache for which she was given 1000 mg Tylenol.  Patient case discussed with Dr. Marye Round who voiced agreement with the plan to discharge the patient with strict return precautions.  Patient return precautions given, patient voiced understanding with these.  Patient had all of her questions answered to her satisfaction.  The patient is stable for discharge.   Final Clinical Impression(s) / ED Diagnoses Final diagnoses:  Injury of head, initial encounter  Fall, initial encounter    Rx / DC Orders ED Discharge Orders     None         Lawana Chambers 12/30/21 1818    Dorie Rank, MD 01/02/22 [phone]

## 2021-12-30 NOTE — ED Triage Notes (Signed)
Per EMS- Patienat was walking to her car and tripped. Patient used her hands to brace herself and is now c/o pain of both palms. Patient did hit her head and has an abrasion to the right forehead. No bleeding and patient does not take blood thinners. Patient denies LOC.

## 2021-12-30 NOTE — ED Provider Triage Note (Signed)
Emergency Medicine Provider Triage Evaluation Note  [facility] , a 71 y.o. female  was evaluated in triage.  Pt complains of ground-level fall today.  Patient states that she was walking out to retrieve groceries from her car, when she fell for an unknown reason.  Patient denies history of recurrent falls.  Patient reports that she struck her head, now has hematoma noted over right eye.  Patient reports extensive medical history.  Denies taking blood thinners.  Reports having irregular heartbeat, denies blood thinners.  Review of Systems  Positive: Lightheadedness, headache Negative: Weakness, chest pain, shortness of breath, abdominal pain, back pain  Physical Exam  BP 135/75 (BP Location: Left Arm)    Pulse 60    Temp 98.6 F (37 C) (Oral)    Resp 16    Ht 5' (1.524 m)    Wt 98.4 kg    SpO2 97%    BMI 42.38 kg/m  Gen:   Awake, no distress   Resp:  Normal effort  MSK:   Moves extremities without difficulty  Other:  Hematoma noted to right eye.  Patient neuro exam shows no focal deficits.  Medical Decision Making  Medically screening exam initiated at 3:16 PM.  Appropriate orders placed.  [facility] was informed that the remainder of the evaluation will be completed by another provider, this initial triage assessment does not replace that evaluation, and the importance of remaining in the ED until their evaluation is complete.     Azucena Cecil, PA-C 12/30/21 1517

## 2022-01-10 ENCOUNTER — Telehealth: Payer: Self-pay | Admitting: Hematology and Oncology

## 2022-01-10 ENCOUNTER — Ambulatory Visit: Payer: Medicare Other | Attending: Internal Medicine | Admitting: Internal Medicine

## 2022-01-10 ENCOUNTER — Encounter: Payer: Self-pay | Admitting: Internal Medicine

## 2022-01-10 VITALS — BP 125/80 | HR 62 | Resp 16 | Ht 61.0 in | Wt 217.2 lb

## 2022-01-10 DIAGNOSIS — Z7689 Persons encountering health services in other specified circumstances: Secondary | ICD-10-CM

## 2022-01-10 DIAGNOSIS — Z23 Encounter for immunization: Secondary | ICD-10-CM | POA: Diagnosis not present

## 2022-01-10 DIAGNOSIS — E1169 Type 2 diabetes mellitus with other specified complication: Secondary | ICD-10-CM | POA: Diagnosis not present

## 2022-01-10 DIAGNOSIS — E785 Hyperlipidemia, unspecified: Secondary | ICD-10-CM | POA: Diagnosis not present

## 2022-01-10 DIAGNOSIS — Z853 Personal history of malignant neoplasm of breast: Secondary | ICD-10-CM

## 2022-01-10 DIAGNOSIS — I10 Essential (primary) hypertension: Secondary | ICD-10-CM | POA: Diagnosis not present

## 2022-01-10 DIAGNOSIS — Z974 Presence of external hearing-aid: Secondary | ICD-10-CM

## 2022-01-10 DIAGNOSIS — F411 Generalized anxiety disorder: Secondary | ICD-10-CM

## 2022-01-10 DIAGNOSIS — R195 Other fecal abnormalities: Secondary | ICD-10-CM | POA: Insufficient documentation

## 2022-01-10 NOTE — Patient Instructions (Signed)

## 2022-01-10 NOTE — Progress Notes (Signed)
Patient ID: Deanna Potter, female    DOB: [birth year]  MRN: [id number]  CC: New Patient (Initial Visit) and Hypertension   Subjective: Deanna Potter is a 71 y.o. female who presents for new patient visit. Her concerns today include:  Patient with history of HTN (LV hypertrophy w/out CHF followed by cardiology Dr. Einar Gip), HL, OSA on CPAP, left breast CA ERP s/p lumpectomy and XRT (dx 06/2020),  hearing impairment wears hearing aids  Pt to est care.  Did not bring meds with her.  She forgot and left meds in the car. Previous PCP was at [facility].  Last seen 10/2021.  Changed because she was not pleased with care she was receiving  HTN:  HYPERTENSION Currently taking: see medication list Norvasc 10 mg and Toprol 50 mg Med Adherence: [x]  Yes    []  No Medication side effects: []  Yes    [x]  No Adherence with salt restriction: [x]  Yes doing better   []  No Home Monitoring?: [x]  Yes    []  No Monitoring Frequency: few x/wk Home BP results range: 117-120s/70-80 SOB? []  Yes    [x]  No Chest Pain?: []  Yes    [x]  No Leg swelling?: []  Yes    [x]  No Headaches?: []  Yes    [x]  No Dizziness? []  Yes    [x]  No Comments:   Pt is hearing impaired and wears hearing aids.  HL:  taking and tolerating Crestor  Hx of Anxiety/insomnia: I see Zoloft and Lexapro on her list.  She thinks she is no longer on the Zoloft.  On Lexapro and Trazodone.  Hydroxyzine on med list but she is not sure she is taking.  At times she does not feel her anxiety is as controlled as she would like it to be. Was seeing a counselor at [facility] about 1 yr ago.  She states it was helpful and tell the counselor keep digging into her childhood causing her to bring up memories that she would rather forget.   Hx of cataract s/p extraction 3 yrs ago and glaucoma.  Uses eye drops daily.  Followed by [facility].    Confirms taking Vit D and Vit B complex which are on her medication list.  Hx of breast CA:  last saw Dr. Lindi Adie  09/2020.  Did not go back for f/u out of fear of recurrence of disease.  She was supposed to be on an aromatase inhibitor anastrozole.  However patient states she decided not to take it after she read online some of the possible side effects.  DM/obesity: Reports having been diagnosed with diabetes around November of last year.  Started on metformin.  However she has not been taking it.  Does not like taking medicines. Checks blood sugars before breakfast and at bedtime.  Range before breakfast 112-130.  Bedtime range in the 130s. -Last eye exam was the end of last year. -Trying to do better with eating habits. -Not getting in much exercise because of chronic back pain issues due to disc disease.  She does chair exercises.  Afraid to go to the gym  HM:  Declines Shingles and pneumonia.  Had Tdap at [facility] last mth.  Repeat warts having a positive Cologuard test about 1.5 years ago.  Colonoscopy was advised but she never had it done.  Afraid of cancer being discovered after having had breast cancer.  No blood in stools.  Not sure of fhx because she was adopted at age 82 and  never knew her father.   Patient Active Problem List   Diagnosis Date Noted   Hx of radiation therapy 11/02/2020   S/P bilateral breast reduction 07/27/2020   Malignant neoplasm of upper-outer quadrant of left breast in female, estrogen receptor positive ([place]) 04/05/2020   Cardiomegaly 03/13/2019   Essential hypertension 03/13/2019   OSA on CPAP 03/13/2019     Current Outpatient Medications on File Prior to Visit  Medication Sig Dispense Refill   amLODipine (NORVASC) 5 MG tablet Take 5 mg by mouth daily.     B Complex Vitamins (VITAMIN B COMPLEX PO) Take 1 tablet by mouth daily.     Cholecalciferol (DIALYVITE VITAMIN D 5000 PO) Take 5,000 mg by mouth daily.     Cod Liver Oil CAPS Take 1 capsule by mouth daily. Plus A&D     escitalopram (LEXAPRO) 20 MG tablet Take 20 mg by mouth daily.     hydrOXYzine  (ATARAX/VISTARIL) 50 MG tablet Take 50 mg by mouth 3 (three) times daily.     latanoprost (XALATAN) 0.005 % ophthalmic solution Place 1 drop into both eyes 2 (two) times daily.      Magnesium 250 MG TABS Take 250 mg by mouth daily.     metoprolol succinate (TOPROL-XL) 50 MG 24 hr tablet Take 1 tablet by mouth every day with a meal 90 tablet 1   Multiple Vitamin (MULTIVITAMIN) capsule Take 1 capsule by mouth daily. Centrum Silver     rosuvastatin (CRESTOR) 5 MG tablet Take 1 tablet by mouth every day 30 tablet 11   sertraline (ZOLOFT) 50 MG tablet Take 1 tablet by mouth daily.     traZODone (DESYREL) 100 MG tablet Take 100 mg by mouth at bedtime.      No current facility-administered medications on file prior to visit.    No Known Allergies  Social History   Socioeconomic History   Marital status: Widowed    Spouse name: Not on file   Number of children: 2   Years of education: Not on file   Highest education level: Not on file  Occupational History   Not on file  Tobacco Use   Smoking status: Never   Smokeless tobacco: Never  Vaping Use   Vaping Use: Never used  Substance and Sexual Activity   Alcohol use: Not Currently   Drug use: Never   Sexual activity: Not on file  Other Topics Concern   Not on file  Social History Narrative   Not on file   Social Determinants of Health   Financial Resource Strain: Not on file  Food Insecurity: Not on file  Transportation Needs: Not on file  Physical Activity: Not on file  Stress: Not on file  Social Connections: Not on file  Intimate Partner Violence: Not on file    Family History  Problem Relation Age of Onset   Stomach cancer Brother     Past Surgical History:  Procedure Laterality Date   BREAST LUMPECTOMY WITH RADIOACTIVE SEED LOCALIZATION Left 07/19/2020   Procedure: LEFT BREAST LUMPECTOMY WITH RADIOACTIVE SEED LOCALIZATION;  Surgeon: Jovita Kussmaul, MD;  Location: [place];  Service: General;  Laterality: Left;   BREAST  REDUCTION SURGERY Bilateral 07/19/2020   Procedure: BILATERAL MAMMARY REDUCTION  (BREAST) ONCO PLASTIC FOR LEFT BREAST CANCER;  Surgeon: Wallace Going, DO;  Location: [place];  Service: Plastics;  Laterality: Bilateral;   EYE SURGERY Bilateral    PARTIAL HYSTERECTOMY      ROS: Review of Systems Negative  except as stated above  PHYSICAL EXAM: BP 125/80    Pulse 62    Resp 16    Ht 5\' 1"  (1.549 m)    Wt 217 lb 3.2 oz (98.5 kg)    SpO2 97%    BMI 41.04 kg/m    Physical Exam  General appearance - alert, well appearing, older African-American female and in no distress.  I am having to speak in a louder voice for patient to hear me well. Mental status - normal mood, behavior, speech, dress, motor activity, and thought processes Eyes - pupils equal and reactive, extraocular eye movements intact Mouth -partials above and below.  Bony overgrowth noted in the roof of the mouth.  Patient states this is chronic and unchanged. Neck - supple, no significant adenopathy.  No thyroid enlargement Chest - clear to auscultation, no wheezes, rales or rhonchi, symmetric air entry Heart - normal rate, regular rhythm, normal S1, S2, no murmurs, rubs, clicks or gallops Extremities - peripheral pulses normal, no pedal edema, no clubbing or cyanosis  Depression screen [facility] 2/9 01/10/2022  Decreased Interest 0  Down, Depressed, Hopeless 0  PHQ - 2 Score 0   GAD 7 : Generalized Anxiety Score 01/10/2022  Nervous, Anxious, on Edge 0  Control/stop worrying 2  Worry too much - different things 1  Trouble relaxing 2      CMP Latest Ref Rng & Units 12/30/2021 06/06/2021 07/12/2020  Glucose 70 - 99 mg/dL 96 121(H) 98  BUN 8 - 23 mg/dL 14 19 12   Creatinine 0.44 - 1.00 mg/dL 1.00 0.93 1.11(H)  Sodium 135 - 145 mmol/L 136 139 137  Potassium 3.5 - 5.1 mmol/L 3.6 3.9 4.2  Chloride 98 - 111 mmol/L 105 107 104  CO2 22 - 32 mmol/L 23 24 25   Calcium 8.9 - 10.3 mg/dL 9.9 9.9 10.1  Total Protein 6.5 - 8.1 g/dL - 7.8 -   Total Bilirubin 0.3 - 1.2 mg/dL - 0.3 -  Alkaline Phos 38 - 126 U/L - 91 -  AST 15 - 41 U/L - 18 -  ALT 0 - 44 U/L - 19 -   Lipid Panel     Component Value Date/Time   CHOL 187 01/21/2021 1312   TRIG 155 (H) 01/21/2021 1312   HDL 42 01/21/2021 1312   LDLCALC 117 (H) 01/21/2021 1312    CBC    Component Value Date/Time   WBC 9.8 12/30/2021 1525   RBC 5.23 (H) 12/30/2021 1525   HGB 13.4 12/30/2021 1525   HGB 13.3 04/07/2020 0814   HCT 40.3 12/30/2021 1525   PLT 334 12/30/2021 1525   PLT 379 04/07/2020 0814   MCV 77.1 (L) 12/30/2021 1525   MCH 25.6 (L) 12/30/2021 1525   MCHC 33.3 12/30/2021 1525   RDW 14.6 12/30/2021 1525   LYMPHSABS 2.2 12/30/2021 1525   MONOABS 0.7 12/30/2021 1525   EOSABS 0.3 12/30/2021 1525   BASOSABS 0.1 12/30/2021 1525    ASSESSMENT AND PLAN: 1. Encounter to establish care Patient to sign a release for [place] to get records from her previous PCP. Advised to bring all medicines with her on next visit.  2. Essential hypertension At goal on amlodipine and Toprol.  3. Type 2 diabetes mellitus with morbid obesity ([place]) Encourage patient to try to do more in terms of exercise.  Discussed trying water aerobics.  However she is afraid to go to the gym because of [place]. Patient advised to eliminate sugary drinks from the diet, cut  back on portion sizes especially of white carbohydrates, eat more white lean meat like chicken [place] and seafood instead of beef or pork and incorporate fresh fruits and vegetables into the diet daily. Check A1c today - Hepatic function panel - Lipid panel - Hemoglobin A1c - Microalbumin / creatinine urine ratio  4. Hyperlipidemia, unspecified hyperlipidemia type Continue rosuvastatin.  Check lipid profile and LFTs.  5. GAD (generalized anxiety disorder) Would need to verify that she is on Lexapro and not Zoloft.  Patient thinks it is Lexapro.  Declines referral for any further counseling at this time  6. History of left  breast cancer Strongly recommend that follow-up with her oncologist.  Patient very anxious about having recurrence or any other type of cancer and this has prevented her from wanting to follow-up.  However I informed her that the goal after being diagnosed is to make sure she has adequate follow-up so that any recurrence can be caught early - Ambulatory referral to Hematology / Oncology  7. Uses hearing aid  8. Positive colorectal cancer screening using Cologuard test Strongly advise that she follows through with getting colonoscopy.  Patient agreeable for referral. - Ambulatory referral to Gastroenterology  9. Need for influenza vaccination Flu shot given.    Patient was given the opportunity to ask questions.  Patient verbalized understanding of the plan and was able to repeat key elements of the plan.   Orders Placed This Encounter  Procedures   Hepatic function panel   Lipid panel   Hemoglobin A1c   Microalbumin / creatinine urine ratio   Ambulatory referral to Gastroenterology   Ambulatory referral to Hematology / Oncology     Requested Prescriptions    No prescriptions requested or ordered in this encounter    Return in about 7 weeks (around 02/28/2022) for f/u for Medicare Wellness.  SIgn release to get records from previous PCP.  Karle Plumber, MD, FACP

## 2022-01-10 NOTE — Telephone Encounter (Signed)
Called pt to sch f/u appt with Dr. Lindi Adie per 2/14 referral from Dr. Wynetta Emery. Pt did not answer and no vm was available.

## 2022-01-11 LAB — HEPATIC FUNCTION PANEL
ALT: 14 IU/L (ref 0–32)
AST: 17 IU/L (ref 0–40)
Albumin: 4.4 g/dL (ref 3.8–4.8)
Alkaline Phosphatase: 95 IU/L (ref 44–121)
Bilirubin Total: 0.2 mg/dL (ref 0.0–1.2)
Bilirubin, Direct: <0.1 mg/dL (ref 0.00–0.40)
Total Protein: 7 g/dL (ref 6.0–8.5)

## 2022-01-11 LAB — LIPID PANEL
Chol/HDL Ratio: 3.3 ratio (ref 0.0–4.4)
Cholesterol, Total: 143 mg/dL (ref 100–199)
HDL: 43 mg/dL (ref >39)
LDL Chol Calc (NIH): 81 mg/dL (ref 0–99)
Triglycerides: 101 mg/dL (ref 0–149)
VLDL Cholesterol Cal: 19 mg/dL (ref 5–40)

## 2022-01-11 LAB — MICROALBUMIN / CREATININE URINE RATIO
Creatinine, Urine: 63 mg/dL
Microalb/Creat Ratio: <5 mg/g creat (ref 0–29)
Microalbumin, Urine: <3 ug/mL

## 2022-01-11 LAB — HEMOGLOBIN A1C
Est. average glucose Bld gHb Est-mCnc: 143 mg/dL
Hgb A1c MFr Bld: 6.6 % — ABNORMAL HIGH (ref 4.8–5.6)

## 2022-01-16 ENCOUNTER — Telehealth: Payer: Self-pay

## 2022-01-16 ENCOUNTER — Telehealth: Payer: Self-pay | Admitting: Hematology and Oncology

## 2022-01-16 NOTE — Telephone Encounter (Signed)
Referral was put in for pt on 2/14 by Dr. Wynetta Emery. Pt is already established with Dr. Lindi Adie. Called pt on 2/14 and again today to sch her for a f/u. Pt did not answer. Left msg for pt to call our office back to sch this f/u. Referral was closed due to pt already being established.

## 2022-01-16 NOTE — Telephone Encounter (Signed)
Contacted pt to go over lab results pt didn't answer lvm   Sent a CRM and forward labs to NT to give pt labs when they call back   

## 2022-01-24 ENCOUNTER — Other Ambulatory Visit: Payer: Self-pay | Admitting: Internal Medicine

## 2022-01-24 NOTE — Telephone Encounter (Signed)
Reference number for patient: [id number]

## 2022-01-24 NOTE — Telephone Encounter (Signed)
Medication Refill - Medication: escitalopram (LEXAPRO) 20 MG tablet  traZODone (DESYREL) 100 MG tablet   Pt is completely out of her current supply   Has the patient contacted their pharmacy? Yes.   (Agent: If no, request that the patient contact the pharmacy for the refill. If patient does not wish to contact the pharmacy document the reason why and proceed with request.) (Agent: If yes, when and what did the pharmacy advise?)  Preferred Pharmacy (with phone number or street name):  [place], [place]  [place] [address]  Phone: [phone] Fax: [phone]   Has the patient been seen for an appointment in the last year OR does the patient have an upcoming appointment? Yes.    Agent: Please be advised that RX refills may take up to 3 business days. We ask that you follow-up with your pharmacy.

## 2022-01-25 NOTE — Telephone Encounter (Signed)
Requested medication (s) are due for refill today: yes ? ?Requested medication (s) are on the active medication list: yes ? ?Last refill:  Lexapro 01/31/2019, trazodone 03/02/2019 ? ?Future visit scheduled: yes ? ?Notes to clinic:  historical meds. Please advise for refills ? ?  ?Requested Prescriptions  ?Pending Prescriptions Disp Refills  ? escitalopram (LEXAPRO) 20 MG tablet    ?  Sig: Take 1 tablet (20 mg total) by mouth daily.  ?  ? Psychiatry:  Antidepressants - SSRI Passed - 01/25/2022  9:26 AM  ?  ?  Passed - Valid encounter within last 6 months  ?  Recent Outpatient Visits   ? ?      ? 2 weeks ago Encounter to establish care  ? [place] Ladell Pier, MD  ? ?  ?  ?Future Appointments   ? ?        ? In 5 days Adrian Prows, MD [facility]  ? ?  ? ?  ?  ?  ? traZODone (DESYREL) 100 MG tablet    ?  Sig: Take 1 tablet (100 mg total) by mouth at bedtime.  ?  ? Psychiatry: Antidepressants - Serotonin Modulator Passed - 01/25/2022  9:26 AM  ?  ?  Passed - Valid encounter within last 6 months  ?  Recent Outpatient Visits   ? ?      ? 2 weeks ago Encounter to establish care  ? [place] Ladell Pier, MD  ? ?  ?  ?Future Appointments   ? ?        ? In 5 days Adrian Prows, MD [facility]  ? ?  ? ?  ?  ?  ? ?

## 2022-01-25 NOTE — Telephone Encounter (Signed)
Requested medication (s) are due for refill today:   Not sure ? ?Requested medication (s) are on the active medication list:   Yes for both as historical meds ? ?Future visit scheduled:   No ? ? ?Last ordered: Lexapro 01/31/2019;  Trazodone 03/02/2019  both historical meds. ? ?Returned for provider to review.    ? ?Requested Prescriptions  ?Pending Prescriptions Disp Refills  ? escitalopram (LEXAPRO) 10 MG tablet [Pharmacy Med Name: Escitalopram 10mg  Tablet] 30 tablet 11  ?  Sig: Take 1 tablet by mouth every day  ?  ? Psychiatry:  Antidepressants - SSRI Passed - 01/24/2022  6:07 PM  ?  ?  Passed - Valid encounter within last 6 months  ?  Recent Outpatient Visits   ? ?      ? 2 weeks ago Encounter to establish care  ? [place] Ladell Pier, MD  ? ?  ?  ?Future Appointments   ? ?        ? In 5 days Adrian Prows, MD [facility]  ? ?  ? ?  ?  ?  ? traZODone (DESYREL) 100 MG tablet [Pharmacy Med Name: Trazodone Hydrochloride 100mg  Tablet] 60 tablet 11  ?  Sig: Take 1 to 2 tablets by mouth at bedtime for sleep  ?  ? Psychiatry: Antidepressants - Serotonin Modulator Passed - 01/24/2022  6:07 PM  ?  ?  Passed - Valid encounter within last 6 months  ?  Recent Outpatient Visits   ? ?      ? 2 weeks ago Encounter to establish care  ? [place] Ladell Pier, MD  ? ?  ?  ?Future Appointments   ? ?        ? In 5 days Adrian Prows, MD [facility]  ? ?  ? ?  ?  ?  ? ?

## 2022-01-30 ENCOUNTER — Ambulatory Visit: Payer: Medicare Other | Admitting: Cardiology

## 2022-01-30 ENCOUNTER — Telehealth: Payer: Self-pay | Admitting: Hematology and Oncology

## 2022-01-30 NOTE — Telephone Encounter (Signed)
Scheduled appointment per 3/2 staff message. Attempted to call the patient at [phone] to inform patient of the appointment. The listed phone number is currently not in service. I called the patients sister Berta Minor [phone]. Left message with Berta Minor. Patient will be mailed an updated calendar. ?

## 2022-02-06 ENCOUNTER — Other Ambulatory Visit: Payer: Self-pay | Admitting: Cardiology

## 2022-02-06 DIAGNOSIS — I10 Essential (primary) hypertension: Secondary | ICD-10-CM

## 2022-02-09 ENCOUNTER — Encounter (HOSPITAL_COMMUNITY): Payer: Self-pay | Admitting: Emergency Medicine

## 2022-02-09 ENCOUNTER — Emergency Department (HOSPITAL_COMMUNITY)
Admission: EM | Admit: 2022-02-09 | Discharge: 2022-02-09 | Disposition: A | Discharge status: Home / Self Care | Payer: Medicare Other | Attending: Student | Admitting: Student

## 2022-02-09 DIAGNOSIS — R5383 Other fatigue: Secondary | ICD-10-CM | POA: Insufficient documentation

## 2022-02-09 DIAGNOSIS — Z79899 Other long term (current) drug therapy: Secondary | ICD-10-CM | POA: Diagnosis not present

## 2022-02-09 DIAGNOSIS — R110 Nausea: Secondary | ICD-10-CM | POA: Diagnosis not present

## 2022-02-09 DIAGNOSIS — Z20822 Contact with and (suspected) exposure to covid-19: Secondary | ICD-10-CM | POA: Diagnosis not present

## 2022-02-09 DIAGNOSIS — E119 Type 2 diabetes mellitus without complications: Secondary | ICD-10-CM | POA: Diagnosis not present

## 2022-02-09 DIAGNOSIS — Z853 Personal history of malignant neoplasm of breast: Secondary | ICD-10-CM | POA: Diagnosis not present

## 2022-02-09 DIAGNOSIS — R5381 Other malaise: Secondary | ICD-10-CM | POA: Diagnosis not present

## 2022-02-09 DIAGNOSIS — I10 Essential (primary) hypertension: Secondary | ICD-10-CM | POA: Diagnosis not present

## 2022-02-09 LAB — CBC WITH DIFFERENTIAL/PLATELET
Abs Immature Granulocytes: 0.03 10*3/uL (ref 0.00–0.07)
Basophils Absolute: 0.1 10*3/uL (ref 0.0–0.1)
Basophils Relative: 1 %
Eosinophils Absolute: 0.3 10*3/uL (ref 0.0–0.5)
Eosinophils Relative: 3 %
HCT: 42.2 % (ref 36.0–46.0)
Hemoglobin: 13.9 g/dL (ref 12.0–15.0)
Immature Granulocytes: 0 %
Lymphocytes Relative: 22 %
Lymphs Abs: 2.3 10*3/uL (ref 0.7–4.0)
MCH: 25.6 pg — ABNORMAL LOW (ref 26.0–34.0)
MCHC: 32.9 g/dL (ref 30.0–36.0)
MCV: 77.9 fL — ABNORMAL LOW (ref 80.0–100.0)
Monocytes Absolute: 0.7 10*3/uL (ref 0.1–1.0)
Monocytes Relative: 7 %
Neutro Abs: 7 10*3/uL (ref 1.7–7.7)
Neutrophils Relative %: 67 %
Platelets: 330 10*3/uL (ref 150–400)
RBC: 5.42 MIL/uL — ABNORMAL HIGH (ref 3.87–5.11)
RDW: 14.8 % (ref 11.5–15.5)
WBC: 10.4 10*3/uL (ref 4.0–10.5)
nRBC: 0 % (ref 0.0–0.2)

## 2022-02-09 LAB — URINALYSIS, ROUTINE W REFLEX MICROSCOPIC
Bilirubin Urine: NEGATIVE
Glucose, UA: NEGATIVE mg/dL
Hgb urine dipstick: NEGATIVE
Ketones, ur: NEGATIVE mg/dL
Leukocytes,Ua: NEGATIVE
Nitrite: NEGATIVE
Protein, ur: NEGATIVE mg/dL
Specific Gravity, Urine: 1.004 — ABNORMAL LOW (ref 1.005–1.030)
pH: 6 (ref 5.0–8.0)

## 2022-02-09 LAB — COMPREHENSIVE METABOLIC PANEL
ALT: 26 U/L (ref 0–44)
AST: 28 U/L (ref 15–41)
Albumin: 4.2 g/dL (ref 3.5–5.0)
Alkaline Phosphatase: 74 U/L (ref 38–126)
Anion gap: 9 (ref 5–15)
BUN: 12 mg/dL (ref 8–23)
CO2: 26 mmol/L (ref 22–32)
Calcium: 10.3 mg/dL (ref 8.9–10.3)
Chloride: 104 mmol/L (ref 98–111)
Creatinine, Ser: 0.96 mg/dL (ref 0.44–1.00)
GFR, Estimated: >60 mL/min (ref >60)
Glucose, Bld: 110 mg/dL — ABNORMAL HIGH (ref 70–99)
Potassium: 3.8 mmol/L (ref 3.5–5.1)
Sodium: 139 mmol/L (ref 135–145)
Total Bilirubin: 0.5 mg/dL (ref 0.3–1.2)
Total Protein: 8 g/dL (ref 6.5–8.1)

## 2022-02-09 LAB — RESP PANEL BY RT-PCR (FLU A&B, COVID) ARPGX2
Influenza A by PCR: NEGATIVE
Influenza B by PCR: NEGATIVE
SARS Coronavirus 2 by RT PCR: NEGATIVE

## 2022-02-09 LAB — TROPONIN I (HIGH SENSITIVITY): Troponin I (High Sensitivity): 7 ng/L (ref <18)

## 2022-02-09 LAB — TSH: TSH: 1.112 u[IU]/mL (ref 0.350–4.500)

## 2022-02-09 MED ORDER — SODIUM CHLORIDE 0.9 % IV BOLUS
1000.0000 mL | Freq: Once | INTRAVENOUS | Status: AC
  Administered 2022-02-09: 1000 mL via INTRAVENOUS

## 2022-02-09 MED ORDER — ONDANSETRON 4 MG PO TBDP: 4.0000 mg | ORAL_TABLET | Freq: Three times a day (TID) | ORAL | 0 refills | Status: DC | PRN

## 2022-02-09 MED ORDER — ONDANSETRON HCL 4 MG/2ML IJ SOLN
4.0000 mg | Freq: Once | INTRAMUSCULAR | Status: AC
  Administered 2022-02-09: 4 mg via INTRAVENOUS
  Filled 2022-02-09: qty 2

## 2022-02-09 NOTE — ED Provider Notes (Signed)
?[place] DEPT ?Provider Note ? ?CSN: [id number] ?Arrival date & time: 02/09/22 1409 ? ?Chief Complaint(s) ?Fatigue ? ?HPI ?Deanna Potter is a 71 y.o. female with PMH cardiomegaly, HTN, T2DM who presents the emergency department for evaluation of generalized fatigue and malaise.  Patient states that symptoms been present for approximately 3 days and she is primarily worried about atypical ACS as she heard that heart related symptoms can be different in African-American diabetics.  Denies chest pain, shortness of breath, headache, fever or other systemic symptoms.  She does endorse mild nausea but no vomiting. ? ?HPI ? ?Past Medical History ?Past Medical History:  ?Diagnosis Date  ? Anxiety   ? Cancer ([place]) 03/21/2020  ? Breast Cancer  ? Cardiomegaly 03/13/2019  ? Glaucoma   ? HTN (hypertension)   ? OSA on CPAP   ? Wears hearing aid   ? ?Patient Active Problem List  ? Diagnosis Date Noted  ? Type 2 diabetes mellitus with morbid obesity ([place]) 01/10/2022  ? Hyperlipidemia 01/10/2022  ? GAD (generalized anxiety disorder) 01/10/2022  ? Uses hearing aid 01/10/2022  ? Positive colorectal cancer screening using Cologuard test 01/10/2022  ? Hx of radiation therapy 11/02/2020  ? S/P bilateral breast reduction 07/27/2020  ? Malignant neoplasm of upper-outer quadrant of left breast in female, estrogen receptor positive ([place]) 04/05/2020  ? Cardiomegaly 03/13/2019  ? Essential hypertension 03/13/2019  ? OSA on CPAP 03/13/2019  ? ?Home Medication(s) ?Prior to Admission medications   ?Medication Sig Start Date End Date Taking? Authorizing Provider  ?ondansetron (ZOFRAN-ODT) 4 MG disintegrating tablet Take 1 tablet (4 mg total) by mouth every 8 (eight) hours as needed for nausea or vomiting. 02/09/22  Yes Zohra Clavel, MD  ?amLODipine (NORVASC) 5 MG tablet Take 5 mg by mouth daily. 06/16/20   [provider]  ?B Complex Vitamins (VITAMIN B COMPLEX PO) Take 1 tablet by mouth daily.    [provider]  ?Cholecalciferol (DIALYVITE VITAMIN D 5000 PO) Take 5,000 mg by mouth daily.    [provider]  ?[facility] Liver Oil CAPS Take 1 capsule by mouth daily. Plus A&D    [provider]  ?escitalopram (LEXAPRO) 10 MG tablet Take 1 tablet by mouth every day 01/25/22   Ladell Pier, MD  ?escitalopram (LEXAPRO) 20 MG tablet Take 20 mg by mouth daily. 01/31/19   [provider]  ?hydrOXYzine (ATARAX/VISTARIL) 50 MG tablet Take 50 mg by mouth 3 (three) times daily. 06/16/20   [provider]  ?latanoprost (XALATAN) 0.005 % ophthalmic solution Place 1 drop into both eyes 2 (two) times daily.     [provider]  ?Magnesium 250 MG TABS Take 250 mg by mouth daily.    [provider]  ?metoprolol succinate (TOPROL-XL) 50 MG 24 hr tablet Take 1 tablet by mouth every day with a meal 02/06/22   Adrian Prows, MD  ?Multiple Vitamin (MULTIVITAMIN) capsule Take 1 capsule by mouth daily. Centrum Silver    [provider]  ?rosuvastatin (CRESTOR) 5 MG tablet Take 1 tablet by mouth every day 11/29/21   Adrian Prows, MD  ?sertraline (ZOLOFT) 50 MG tablet Take 1 tablet by mouth daily. 01/10/21   [provider]  ?traZODone (DESYREL) 100 MG tablet Take 1 to 2 tablets by mouth at bedtime for sleep 01/25/22   Ladell Pier, MD  ?                                                                                                                                  ?  Past Surgical History ?Past Surgical History:  ?Procedure Laterality Date  ? BREAST LUMPECTOMY WITH RADIOACTIVE SEED LOCALIZATION Left 07/19/2020  ? Procedure: LEFT BREAST LUMPECTOMY WITH RADIOACTIVE SEED LOCALIZATION;  Surgeon: Jovita Kussmaul, MD;  Location: [place];  Service: General;  Laterality: Left;  ? BREAST REDUCTION SURGERY Bilateral 07/19/2020  ? Procedure: BILATERAL MAMMARY REDUCTION  (BREAST) ONCO PLASTIC FOR LEFT BREAST CANCER;  Surgeon: Wallace Going, DO;  Location: [place];  Service: Plastics;   Laterality: Bilateral;  ? EYE SURGERY Bilateral   ? PARTIAL HYSTERECTOMY    ? ?Family History ?Family History  ?Problem Relation Age of Onset  ? Stomach cancer Brother   ? ? ?Social History ?Social History  ? ?Tobacco Use  ? Smoking status: Never  ? Smokeless tobacco: Never  ?Vaping Use  ? Vaping Use: Never used  ?Substance Use Topics  ? Alcohol use: Not Currently  ? Drug use: Never  ? ?Allergies ?Patient has no known allergies. ? ?Review of Systems ?Review of Systems  ?Constitutional:  Positive for fatigue.  ?Gastrointestinal:  Positive for nausea.  ? ?Physical Exam ?Vital Signs  ?I have reviewed the triage vital signs ?BP (!) 150/91   Pulse 73   Temp 98.7 ?F (37.1 ?C) (Oral)   Resp 19   SpO2 97%  ? ?Physical Exam ?Vitals and nursing note reviewed.  ?Constitutional:   ?   General: She is not in acute distress. ?   Appearance: She is well-developed.  ?HENT:  ?   Head: Normocephalic and atraumatic.  ?Eyes:  ?   Conjunctiva/sclera: Conjunctivae normal.  ?Cardiovascular:  ?   Rate and Rhythm: Normal rate and regular rhythm.  ?   Heart sounds: No murmur heard. ?Pulmonary:  ?   Effort: Pulmonary effort is normal. No respiratory distress.  ?   Breath sounds: Normal breath sounds.  ?Abdominal:  ?   Palpations: Abdomen is soft.  ?   Tenderness: There is no abdominal tenderness.  ?Musculoskeletal:     ?   General: No swelling.  ?   Cervical back: Neck supple.  ?Skin: ?   General: Skin is warm and dry.  ?   Capillary Refill: Capillary refill takes less than 2 seconds.  ?Neurological:  ?   Mental Status: She is alert.  ?Psychiatric:     ?   Mood and Affect: Mood normal.  ? ? ?ED Results and Treatments ?Labs ?(all labs ordered are listed, but only abnormal results are displayed) ?Labs Reviewed  ?CBC WITH DIFFERENTIAL/PLATELET - Abnormal; Notable for the following components:  ?    Result Value  ? RBC 5.42 (*)   ? MCV 77.9 (*)   ? MCH 25.6 (*)   ? All other components within normal limits  ?URINALYSIS, ROUTINE W REFLEX  MICROSCOPIC - Abnormal; Notable for the following components:  ? Color, Urine STRAW (*)   ? Specific Gravity, Urine 1.004 (*)   ? All other components within normal limits  ?COMPREHENSIVE METABOLIC PANEL - Abnormal; Notable for the following components:  ? Glucose, Bld 110 (*)   ? All other components within normal limits  ?RESP PANEL BY RT-PCR (FLU A&B, COVID) ARPGX2  ?TSH  ?TROPONIN I (HIGH SENSITIVITY)  ?                                                                                                                       ? ?  Radiology ?No results found. ? ?Pertinent labs & imaging results that were available during my care of the patient were reviewed by me and considered in my medical decision making (see MDM for details). ? ?Medications Ordered in ED ?Medications  ?sodium chloride 0.9 % bolus 1,000 mL (0 mLs Intravenous Stopped 02/09/22 1753)  ?ondansetron [facility]) injection 4 mg (4 mg Intravenous Given 02/09/22 1533)  ?                                                               ?                                                                    ?Procedures ?Procedures ? ?(including critical care time) ? ?Medical Decision Making / ED Course ? ? ?This patient presents to the ED for concern of malaise, nausea, this involves an extensive number of treatment options, and is a complaint that carries with it a high risk of complications and morbidity.  The differential diagnosis includes viral illness, anemia, gastroenteritis, polypharmacy ? ?MDM: ?Patient seen emergency department for evaluation of fatigue and malaise.  Physical exam unremarkable.  Laboratory evaluation unremarkable.  TSH negative.  COVID and flu negative.  Urinalysis negative.  Trope negative.  Patient given Zofran and symptoms improved.  On reevaluation, patient states that her symptoms improved and she would like to be discharged.  Patient presentation likely consistent with a viral illness and she was discharged with primary care follow-up and  a prescription for ODT Zofran. ? ? ?Additional history obtained: ? ?-External records from outside source obtained and reviewed including: Chart review including previous notes, labs, imaging, consultation notes

## 2022-02-09 NOTE — ED Provider Triage Note (Signed)
Emergency Medicine Provider Triage Evaluation Note ? ?[facility] , a 71 y.o. female  was evaluated in triage.  Patient has a past medical history of hypertension, hyperlipidemia and breast cancer status post radiation that ended 3 months ago presenting with "overall not feeling well."  Reports she feels fatigued and often nauseated like she is going to throw up and passed out.  Has not been around anybody who has been sick. ?Review of Systems  ?Positive: Fatigue and nausea ?Negative: Shortness of breath, chest pain, syncope, vomiting or diarrhea ? ?Physical Exam  ?BP (!) 161/85 (BP Location: Left Arm)   Pulse 72   Temp 98.7 ?F (37.1 ?C) (Oral)   Resp 16   SpO2 91%  ?Gen:   Awake, no distress   ?Resp:  Normal effort  ?MSK:   Moves extremities without difficulty  ?Other:   ? ?Medical Decision Making  ?Medically screening exam initiated at 2:46 PM.  Appropriate orders placed.  [facility] was informed that the remainder of the evaluation will be completed by another provider, this initial triage assessment does not replace that evaluation, and the importance of remaining in the ED until their evaluation is complete. ? ? ?  ?Rhae Hammock, PA-C ?02/09/22 1452 ? ?

## 2022-02-09 NOTE — ED Triage Notes (Signed)
Per patient, states she has not felt well for a couple of days-states no symptoms other than fatique ?

## 2022-02-17 NOTE — Progress Notes (Signed)
? ?Patient Care Team: ?Ladell Pier, MD as PCP - General (Internal Medicine) ?Mauro Kaufmann, RN as Oncology Nurse Navigator ?Rockwell Germany, RN as Oncology Nurse Navigator ?Nicholas Lose, MD as Consulting Physician (Hematology and Oncology) ?Jovita Kussmaul, MD as Consulting Physician (General Surgery) ?Gery Pray, MD as Consulting Physician (Radiation Oncology) ? ?DIAGNOSIS:  ?Encounter Diagnosis  ?Name Primary?  ? Malignant neoplasm of upper-outer quadrant of left breast in female, estrogen receptor positive ([place]) Yes  ? ? ?SUMMARY OF ONCOLOGIC HISTORY: ?Oncology History  ?Malignant neoplasm of upper-outer quadrant of left breast in female, estrogen receptor positive ([place])  ?04/05/2020 Initial Diagnosis  ? Screening mammogram detected right breast masses and left breast masses and calcifications. Diagnostic mammogram showed benign right breast masses, and in the left breast, 2 benign masses and a 0.9cm group of calcifications in the upper, outer breast. 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 ?  ?07/19/2020 Surgery  ? Left lumpectomy and bilateral breast reduction [facility]) [phone]):  ? ?Left breast: multifocal IDC, grade 1, 0.2cm, with high grade DCIS. Negative margins. ? ?Right breast: usual ductal hyperplasia, adenosis, and incidental intraductal papilloma. ? ?No lymph nodes were examined ?  ?09/08/2020 - 10/11/2020 Radiation Therapy  ? The patient initially received a dose of 40.05 Gy in 15 fractions to the breast using whole-breast tangent fields. This was delivered using a 3-D conformal technique. The pt received a boost delivering an additional 12 Gy in 6 fractions using a electron boost with 58mV electrons. The total dose was 52.05 Gy. ?  ?10/04/2020 Cancer Staging  ? Staging form: Breast, AJCC 8th Edition ?- Pathologic stage from 10/04/2020: Stage IA (pT1a, pN0, cM0, G1, ER+, PR+, HER2-) ?  ?09/2020 - 09/2025 Anti-estrogen oral therapy  ? Anastrozole ?  ? ? ?CHIEF  COMPLIANT: Follow-up of left breast DCIS to discuss antiestrogen therapy. ? ?INTERVAL HISTORY: Deanna Childersis a 71y.o. with above-mentioned history of left breast DCIS who underwent a left lumpectomy and bilateral breast reduction and is currently on radiation therapy. She presents to the clinic today for a follow-up. She complains of sharp pain and tenderness in breast. ? ? ?ALLERGIES:  has No Known Allergies. ? ?MEDICATIONS:  ?Current Outpatient Medications  ?Medication Sig Dispense Refill  ? letrozole (FEMARA) 2.5 MG tablet Take 1 tablet (2.5 mg total) by mouth daily. 30 tablet 0  ? amLODipine (NORVASC) 5 MG tablet Take 5 mg by mouth daily.    ? B Complex Vitamins (VITAMIN B COMPLEX PO) Take 1 tablet by mouth daily.    ? Cholecalciferol (DIALYVITE VITAMIN D 5000 PO) Take 5,000 mg by mouth daily.    ? Cod Liver Oil CAPS Take 1 capsule by mouth daily. Plus A&D    ? escitalopram (LEXAPRO) 10 MG tablet Take 1 tablet by mouth every day 30 tablet 4  ? escitalopram (LEXAPRO) 20 MG tablet Take 20 mg by mouth daily.    ? hydrOXYzine (ATARAX/VISTARIL) 50 MG tablet Take 50 mg by mouth 3 (three) times daily.    ? latanoprost (XALATAN) 0.005 % ophthalmic solution Place 1 drop into both eyes 2 (two) times daily.     ? Magnesium 250 MG TABS Take 250 mg by mouth daily.    ? metoprolol succinate (TOPROL-XL) 50 MG 24 hr tablet Take 1 tablet by mouth every day with a meal 30 tablet 11  ? Multiple Vitamin (MULTIVITAMIN) capsule Take 1 capsule by mouth daily. Centrum Silver    ? ondansetron (  ZOFRAN-ODT) 4 MG disintegrating tablet Take 1 tablet (4 mg total) by mouth every 8 (eight) hours as needed for nausea or vomiting. 20 tablet 0  ? rosuvastatin (CRESTOR) 5 MG tablet Take 1 tablet by mouth every day 30 tablet 11  ? sertraline (ZOLOFT) 50 MG tablet Take 1 tablet by mouth daily.    ? traZODone (DESYREL) 100 MG tablet Take 1 to 2 tablets by mouth at bedtime for sleep 60 tablet 4  ? ?No current facility-administered medications for  this visit.  ? ? ?PHYSICAL EXAMINATION: ?ECOG PERFORMANCE STATUS: 1 - Symptomatic but completely ambulatory ? ?Vitals:  ? 02/20/22 1146  ?BP: (!) 149/83  ?Pulse: 84  ?Resp: 18  ?Temp: (!) 97.5 ?F (36.4 ?C)  ?SpO2: 98%  ? ?Filed Weights  ? 02/20/22 1146  ?Weight: 216 lb 3.2 oz (98.1 kg)  ? ? ?BREAST: Tenderness in the left breast with palpable nodular scar tissue. No palpable axillary supraclavicular or infraclavicular adenopathy no breast tenderness or nipple discharge. (exam performed in the presence of a chaperone) ? ?LABORATORY DATA:  ?I have reviewed the data as listed ? ?  Latest Ref Rng & Units 02/09/2022  ?  2:54 PM 01/10/2022  ? 10:15 AM 12/30/2021  ?  3:25 PM  ?CMP  ?Glucose 70 - 99 mg/dL 110    96    ?BUN 8 - 23 mg/dL 12    14    ?Creatinine 0.44 - 1.00 mg/dL 0.96    1.00    ?Sodium 135 - 145 mmol/L 139    136    ?Potassium 3.5 - 5.1 mmol/L 3.8    3.6    ?Chloride 98 - 111 mmol/L 104    105    ?CO2 22 - 32 mmol/L 26    23    ?Calcium 8.9 - 10.3 mg/dL 10.3    9.9    ?Total Protein 6.5 - 8.1 g/dL 8.0   7.0     ?Total Bilirubin 0.3 - 1.2 mg/dL 0.5   0.2     ?Alkaline Phos 38 - 126 U/L 74   95     ?AST 15 - 41 U/L 28   17     ?ALT 0 - 44 U/L 26   14     ? ? ?Lab Results  ?Component Value Date  ? WBC 10.4 02/09/2022  ? HGB 13.9 02/09/2022  ? HCT 42.2 02/09/2022  ? MCV 77.9 (L) 02/09/2022  ? PLT 330 02/09/2022  ? NEUTROABS 7.0 02/09/2022  ? ? ?ASSESSMENT & PLAN:  ?Malignant neoplasm of upper-outer quadrant of left breast in female, estrogen receptor positive ([place]) ?07/19/2020: Left lumpectomy and bilateral breast reduction [facility]):  ?Left breast: multifocal IDC, grade 1, 0.2cm, with high grade DCIS,  ?Right breast: usual ductal hyperplasia, adenosis, and incidental intraductal papilloma. ?    ?Treatment plan: ?1.  Adjuvant radiation therapy 09/09/2020-10/06/2020 ?2. followed by adjuvant antiestrogen therapy with letrozole 1 mg daily x5 years ?  ?Letrozole toxicities: Patient has not started anastrozole  therapy because she was very worried about adverse effects.  We discussed the risks and benefits of anastrozole.  She is agreeable to try taking it half a tablet every other day for a month and see if she can tolerate it.  She is very concerned about her back issues. ? ?Bone density 06/01/2021: T score 0.2: Normal ? ?Breast cancer surveillance: ?1.  Breast exam 02/20/2022: Benign, left breast tenderness as well as scar tissue is palpable.  No  axillary lymph nodes ?2. mammogram: Patient has not had a mammogram and I recommended that she get it in the next couple of weeks at [place]. ? ?Telephone visit in 1 month to discuss side effects to letrozole ? ? ? ?Orders Placed This Encounter  ?Procedures  ? MM DIAG BREAST TOMO BILATERAL  ?  Standing Status:   Future  ?  Standing Expiration Date:   02/21/2023  ?  Order Specific Question:   Reason for Exam (SYMPTOM  OR DIAGNOSIS REQUIRED)  ?  Answer:   left breast pain  ?  Order Specific Question:   Preferred imaging location?  ?  Answer:   [facility]  ?  Order Specific Question:   Release to patient  ?  Answer:   Immediate  ? ?The patient has a good understanding of the overall plan. she agrees with it. she will call with any problems that may develop before the next visit here. ?Total time spent: 30 mins including face to face time and time spent for planning, charting and co-ordination of care ? ? Harriette Ohara, MD ?02/20/22 ? ? ? I Gardiner Coins am scribing for Dr. Lindi Adie ? ?I have reviewed the above documentation for accuracy and completeness, and I agree with the above. ?  ?

## 2022-02-20 ENCOUNTER — Other Ambulatory Visit: Payer: Self-pay

## 2022-02-20 ENCOUNTER — Inpatient Hospital Stay: Payer: Medicare Other | Attending: Hematology and Oncology | Admitting: Hematology and Oncology

## 2022-02-20 VITALS — BP 149/83 | HR 84 | Temp 97.5°F | Resp 18 | Ht 61.0 in | Wt 216.2 lb

## 2022-02-20 DIAGNOSIS — Z170 Estrogen receptor positive status [ER+]: Secondary | ICD-10-CM

## 2022-02-20 DIAGNOSIS — D0512 Intraductal carcinoma in situ of left breast: Secondary | ICD-10-CM | POA: Insufficient documentation

## 2022-02-20 DIAGNOSIS — C50412 Malignant neoplasm of upper-outer quadrant of left female breast: Secondary | ICD-10-CM | POA: Diagnosis not present

## 2022-02-20 MED ORDER — LETROZOLE 2.5 MG PO TABS: 2.5000 mg | ORAL_TABLET | Freq: Every day | ORAL | 0 refills | Status: DC

## 2022-02-20 NOTE — Assessment & Plan Note (Addendum)
07/19/2020:?Left lumpectomy and bilateral breast reduction Deanna Potter &?[facility]):  ?Left breast: multifocal IDC, grade 1, 0.2cm, with high grade DCIS,  ?Right breast: usual ductal hyperplasia, adenosis, and incidental intraductal papilloma. ?? ? ?Treatment plan: ?1.??Adjuvant radiation therapy 09/09/2020-10/06/2020 ?2.?followed by adjuvant antiestrogen therapy with letrozole 1 mg daily x5 years ?? ?Letrozole toxicities: Patient has not started anastrozole therapy because she was very worried about adverse effects.  We discussed the risks and benefits of anastrozole.  She is agreeable to try taking it half a tablet every other day for a month and see if she can tolerate it.  She is very concerned about her back issues. ? ?Bone density 06/01/2021: T score 0.2: Normal ? ?Breast cancer surveillance: ?1.  Breast exam 02/20/2022: Benign, left breast tenderness as well as scar tissue is palpable.  No axillary lymph nodes ?2. mammogram: Patient has not had a mammogram and I recommended that she get it in the next couple of weeks at [facility]. ? ?Telephone visit in 1 month to discuss side effects to letrozole ?

## 2022-02-21 ENCOUNTER — Other Ambulatory Visit: Payer: Self-pay | Admitting: Hematology and Oncology

## 2022-02-21 DIAGNOSIS — Z170 Estrogen receptor positive status [ER+]: Secondary | ICD-10-CM

## 2022-02-22 ENCOUNTER — Ambulatory Visit: Payer: Medicare Other | Admitting: Podiatry

## 2022-02-23 ENCOUNTER — Ambulatory Visit: Payer: Medicare Other | Admitting: Student

## 2022-02-23 ENCOUNTER — Encounter: Payer: Self-pay | Admitting: Student

## 2022-02-23 VITALS — BP 132/70 | HR 79 | Temp 98.4°F | Resp 17 | Ht 61.0 in | Wt 218.0 lb

## 2022-02-23 DIAGNOSIS — E785 Hyperlipidemia, unspecified: Secondary | ICD-10-CM

## 2022-02-23 DIAGNOSIS — I119 Hypertensive heart disease without heart failure: Secondary | ICD-10-CM

## 2022-02-23 DIAGNOSIS — I10 Essential (primary) hypertension: Secondary | ICD-10-CM

## 2022-02-23 NOTE — Progress Notes (Signed)
? ?Primary Physician/Referring:  Ladell Pier, MD ? ?Patient ID: Deanna Potter, female    DOB: 1950/12/03, 71 y.o.   MRN: [id number] ? ?Chief Complaint  ?Patient presents with  ? Hypertension  ? Hyperlipidemia  ?  1 YEAR  ? ?HPI:   ? ?Deanna Potter  is a 71 y.o. Caucasian female patient with hypertension, chronic palpitations, very mild hyperlipidemia, obstructive sleep apnea not using CPAP, moved from [place] in 2019.  She was also told to have normal nuclear stress test at that time. ? ?On 07/19/2020 underwent left breast lumpectomy and underwent radiation therapy on 10/11/2020.  She was not recommended any adjuvant chemotherapy.   ? ?Patient presents for annual follow-up.  Office visit added Crestor 5 mg daily, repeat lipid profile testing showed lipids are well controlled.  Patient is tolerating Crestor without issue.  She remains asymptomatic from a cardiac standpoint, denies chest pain, dyspnea, PND, orthopnea.  She has had no recurrence of palpitations since last office visit. ? ?Past Medical History:  ?Diagnosis Date  ? Anxiety   ? Cancer ([place]) 03/21/2020  ? Breast Cancer  ? Cardiomegaly 03/13/2019  ? Glaucoma   ? HTN (hypertension)   ? OSA on CPAP   ? Wears hearing aid   ? ?Past Surgical History:  ?Procedure Laterality Date  ? BREAST LUMPECTOMY WITH RADIOACTIVE SEED LOCALIZATION Left 07/19/2020  ? Procedure: LEFT BREAST LUMPECTOMY WITH RADIOACTIVE SEED LOCALIZATION;  Surgeon: Jovita Kussmaul, MD;  Location: [facility];  Service: General;  Laterality: Left;  ? BREAST REDUCTION SURGERY Bilateral 07/19/2020  ? Procedure: BILATERAL MAMMARY REDUCTION  (BREAST) ONCO PLASTIC FOR LEFT BREAST CANCER;  Surgeon: Wallace Going, DO;  Location: [facility];  Service: Plastics;  Laterality: Bilateral;  ? EYE SURGERY Bilateral   ? PARTIAL HYSTERECTOMY    ? ? ?Social History  ? ?Tobacco Use  ? Smoking status: Never  ? Smokeless tobacco: Never  ?Substance Use Topics  ? Alcohol use: Not Currently  ?Marital status: Widowed   ? ?ROS   ?Review of Systems  ?HENT:  Positive for hearing loss (moderate, uses hearing aids).   ?Eyes:  Positive for visual disturbance (glaucoma).  ?Cardiovascular:  Positive for palpitations (chronic and stable and brief). Negative for dyspnea on exertion, leg swelling and syncope.  ?Respiratory:  Positive for snoring (Using CPAP ).   ?Musculoskeletal:  Positive for back pain (chronic).  ?All other systems reviewed and are negative. ? ?Objective  ? ? ?  02/23/2022  ?  3:40 PM 02/23/2022  ?  3:32 PM 02/20/2022  ? 11:46 AM  ?Vitals with BMI  ?Height  5' 1"  5' 1"   ?Weight  218 lbs 216 lbs 3 oz  ?BMI  41.21 40.87  ?Systolic 585 929 244  ?Diastolic 70 71 83  ?Pulse 79 82 84  ?  ?  ?Physical Exam ?Constitutional:   ?   Comments: Short stature, moderately obese in no acute distress.  ?Neck:  ?   Thyroid: No thyromegaly.  ?   Vascular: No JVD.  ?Cardiovascular:  ?   Rate and Rhythm: Normal rate and regular rhythm.  ?   Pulses: Intact distal pulses.  ?   Heart sounds: Normal heart sounds. No murmur heard. ?  No gallop.  ?   Comments: No leg edema, no JVD. ?Pulmonary:  ?   Effort: Pulmonary effort is normal.  ?   Breath sounds: Normal breath sounds.  ?Abdominal:  ?   General: Bowel sounds are normal.  ?  Palpations: Abdomen is soft.  ?Musculoskeletal:     ?   General: Normal range of motion.  ?   Cervical back: Neck supple.  ?Skin: ?   General: Skin is warm and dry.  ?Neurological:  ?   Mental Status: She is alert.  ? ?Laboratory examination:  ? ?Recent Labs  ?  06/06/21 ?2333 12/30/21 ?1525 02/09/22 ?1454  ?NA 139 136 139  ?K 3.9 3.6 3.8  ?CL 107 105 104  ?CO2 24 23 26   ?GLUCOSE 121* 96 110*  ?BUN 19 14 12   ?CREATININE 0.93 1.00 0.96  ?CALCIUM 9.9 9.9 10.3  ?GFRNONAA >60 >60 >60  ? ?estimated creatinine clearance is 59.5 mL/min (by C-G formula based on SCr of 0.96 mg/dL).  ? ?  Latest Ref Rng & Units 02/09/2022  ?  2:54 PM 01/10/2022  ? 10:15 AM 12/30/2021  ?  3:25 PM  ?CMP  ?Glucose 70 - 99 mg/dL 110    96    ?BUN 8 - 23 mg/dL 12    14     ?Creatinine 0.44 - 1.00 mg/dL 0.96    1.00    ?Sodium 135 - 145 mmol/L 139    136    ?Potassium 3.5 - 5.1 mmol/L 3.8    3.6    ?Chloride 98 - 111 mmol/L 104    105    ?CO2 22 - 32 mmol/L 26    23    ?Calcium 8.9 - 10.3 mg/dL 10.3    9.9    ?Total Protein 6.5 - 8.1 g/dL 8.0   7.0     ?Total Bilirubin 0.3 - 1.2 mg/dL 0.5   0.2     ?Alkaline Phos 38 - 126 U/L 74   95     ?AST 15 - 41 U/L 28   17     ?ALT 0 - 44 U/L 26   14     ? ? ?  Latest Ref Rng & Units 02/09/2022  ?  2:54 PM 12/30/2021  ?  3:25 PM 06/06/2021  ? 11:33 PM  ?CBC  ?WBC 4.0 - 10.5 K/uL 10.4   9.8   11.8    ?Hemoglobin 12.0 - 15.0 g/dL 13.9   13.4   13.5    ?Hematocrit 36.0 - 46.0 % 42.2   40.3   41.5    ?Platelets 150 - 400 K/uL 330   334   336    ? ?Lipid Panel  ?   ?Component Value Date/Time  ? CHOL 143 01/10/2022 1015  ? TRIG 101 01/10/2022 1015  ? HDL 43 01/10/2022 1015  ? CHOLHDL 3.3 01/10/2022 1015  ? [place] 81 01/10/2022 1015  ? ?HEMOGLOBIN A1C ?Lab Results  ?Component Value Date  ? HGBA1C 6.6 (H) 01/10/2022  ? ?TSH ?Recent Labs  ?  02/09/22 ?1518  ?TSH 1.112  ?  ? ?External Labs: ?02/03/2019:  ?HB 13.5/HCT 40.9, normal indicis, platelets 184.  BUN 12, creatinine 0.92, eGFR 65 mL.  Potassium 4.1.  CMP normal.  Total cholesterol 179, triglycerides 121, HDL 60, LDL 107.  Non-HDL cholesterol 129. ? ?Allergies  ?No Known Allergies  ? ?Medications Prior to Visit:  ? ?Outpatient Medications Prior to Visit  ?Medication Sig Dispense Refill  ? amLODipine (NORVASC) 5 MG tablet Take 5 mg by mouth daily.    ? hydrOXYzine (ATARAX/VISTARIL) 50 MG tablet Take 50 mg by mouth 3 (three) times daily.    ? latanoprost (XALATAN) 0.005 % ophthalmic solution Place 1 drop into both  eyes 2 (two) times daily.     ? letrozole (FEMARA) 2.5 MG tablet Take 1 tablet (2.5 mg total) by mouth daily. 30 tablet 0  ? Magnesium 250 MG TABS Take 250 mg by mouth daily.    ? metoprolol succinate (TOPROL-XL) 50 MG 24 hr tablet Take 1 tablet by mouth every day with a meal 30 tablet 11  ?  ondansetron (ZOFRAN-ODT) 4 MG disintegrating tablet Take 1 tablet (4 mg total) by mouth every 8 (eight) hours as needed for nausea or vomiting. 20 tablet 0  ? rosuvastatin (CRESTOR) 5 MG tablet Take 1 tablet by mouth every day 30 tablet 11  ? sertraline (ZOLOFT) 50 MG tablet Take 1 tablet by mouth daily.    ? traZODone (DESYREL) 100 MG tablet Take 1 to 2 tablets by mouth at bedtime for sleep 60 tablet 4  ? escitalopram (LEXAPRO) 10 MG tablet Take 1 tablet by mouth every day 30 tablet 4  ? escitalopram (LEXAPRO) 20 MG tablet Take 20 mg by mouth daily.    ? B Complex Vitamins (VITAMIN B COMPLEX PO) Take 1 tablet by mouth daily.    ? Cholecalciferol (DIALYVITE VITAMIN D 5000 PO) Take 5,000 mg by mouth daily.    ? Cod Liver Oil CAPS Take 1 capsule by mouth daily. Plus A&D    ? gabapentin (NEURONTIN) 100 MG capsule Take 100 mg by mouth 3 (three) times daily.    ? metFORMIN (GLUCOPHAGE) 500 MG tablet Take 500 mg by mouth 2 (two) times daily.    ? Multiple Vitamin (MULTIVITAMIN) capsule Take 1 capsule by mouth daily. Centrum Silver    ? ?No facility-administered medications prior to visit.  ? ?Final Medications at End of Visit   ? ?Current Meds  ?Medication Sig  ? amLODipine (NORVASC) 5 MG tablet Take 5 mg by mouth daily.  ? hydrOXYzine (ATARAX/VISTARIL) 50 MG tablet Take 50 mg by mouth 3 (three) times daily.  ? latanoprost (XALATAN) 0.005 % ophthalmic solution Place 1 drop into both eyes 2 (two) times daily.   ? letrozole (FEMARA) 2.5 MG tablet Take 1 tablet (2.5 mg total) by mouth daily.  ? Magnesium 250 MG TABS Take 250 mg by mouth daily.  ? metoprolol succinate (TOPROL-XL) 50 MG 24 hr tablet Take 1 tablet by mouth every day with a meal  ? ondansetron (ZOFRAN-ODT) 4 MG disintegrating tablet Take 1 tablet (4 mg total) by mouth every 8 (eight) hours as needed for nausea or vomiting.  ? rosuvastatin (CRESTOR) 5 MG tablet Take 1 tablet by mouth every day  ? sertraline (ZOLOFT) 50 MG tablet Take 1 tablet by mouth daily.  ?  traZODone (DESYREL) 100 MG tablet Take 1 to 2 tablets by mouth at bedtime for sleep  ? ?Radiology:  ?No results found. ? ?Cardiac Studies:  ? ?Echocardiogram 08/05/2020: ?Normal LV systolic function wi

## 2022-03-02 ENCOUNTER — Ambulatory Visit: Payer: Medicare Other | Admitting: Internal Medicine

## 2022-03-10 NOTE — Progress Notes (Signed)
HEMATOLOGY-ONCOLOGY TELEPHONE VISIT PROGRESS NOTE ? ?I connected with Deanna Potter on 03/24/22 at 11:15 AM EDT by telephone and verified that I am speaking with the correct person using two identifiers.  ?I discussed the limitations, risks, security and privacy concerns of performing an evaluation and management service by telephone and the availability of in person appointments.  ?I also discussed with the patient that there may be a patient responsible charge related to this service. The patient expressed understanding and agreed to proceed.  ? ?History of Present Illness: Deanna Potter is a  39 y.[place] Kitcheno. with  follow-upabove-mentioned history of left breast DCIS who underwent a left lumpectomy and bilateral breast reduction and is currently on radiation therapy. She presents to the clinic today vie telephone follow-up.  She decided not to take antiestrogen therapy.  She reports to be feeling fine without any complaints or concerns. ? ? ?Oncology History  ?Malignant neoplasm of upper-outer quadrant of left breast in female, estrogen receptor positive ([place])  ?04/05/2020 Initial Diagnosis  ? Screening mammogram detected right breast masses and left breast masses and calcifications. Diagnostic mammogram showed benign right breast masses, and in the left breast, 2 benign masses and a 0.9cm group of calcifications in the upper, outer breast. 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 ?  ?07/19/2020 Surgery  ? Left lumpectomy and bilateral breast reduction [facility]) [phone]):  ? ?Left breast: multifocal IDC, grade 1, 0.2cm, with high grade DCIS. Negative margins. ? ?Right breast: usual ductal hyperplasia, adenosis, and incidental intraductal papilloma. ? ?No lymph nodes were examined ?  ?09/08/2020 - 10/11/2020 Radiation Therapy  ? The patient initially received a dose of 40.05 Gy in 15 fractions to the breast using whole-breast tangent fields. This was delivered using a 3-D conformal technique. The pt received  a boost delivering an additional 12 Gy in 6 fractions using a electron boost with 79mV electrons. The total dose was 52.05 Gy. ?  ?10/04/2020 Cancer Staging  ? Staging form: Breast, AJCC 8th Edition ?- Pathologic stage from 10/04/2020: Stage IA (pT1a, pN0, cM0, G1, ER+, PR+, HER2-) ?  ?09/2020 - 09/2025 Anti-estrogen oral therapy  ? Anastrozole ?  ? ? ?REVIEW OF SYSTEMS:   ?Constitutional: Denies fevers, chills or abnormal weight loss ?  ?All other systems were reviewed with the patient and are negative. ?Observations/Objective:  ? ?  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 ?    ?Treatment plan: ?1.  Adjuvant radiation therapy 09/09/2020-10/06/2020 ?2. antiestrogen therapy was recommended but patient decided not to take it. ?  ?Bone density 06/01/2021: T score 0.2: Normal ?  ?Breast cancer surveillance: ?1.  Breast exam 02/20/2022: Benign, left breast tenderness as well as scar tissue is palpable.  No axillary lymph nodes ?[facility] ?  ?Return to clinic in 1 year for follow-up ? ? ? ?I discussed the assessment and treatment plan with the patient. The patient was provided an opportunity to ask questions and all were answered. The patient agreed with the plan and demonstrated an understanding of the instructions. The patient was advised to call back or seek an in-person evaluation if the symptoms worsen or if the condition fails to improve as anticipated.  ? ?I provided 12 minutes of  non-face-to-face time  ?during this encounter. Harriette Ohara, MD   ?Earlie Server am scribing for Dr. Lindi Adie ? ?I have reviewed the above documentation for accuracy and completeness, and I agree with  the above. ?  ?

## 2022-03-21 ENCOUNTER — Ambulatory Visit
Admission: RE | Admit: 2022-03-21 | Discharge: 2022-03-21 | Disposition: A | Discharge status: Home / Self Care | Payer: Medicare Other | Source: Ambulatory Visit | Attending: Hematology and Oncology | Admitting: Hematology and Oncology

## 2022-03-21 ENCOUNTER — Ambulatory Visit: Admission: RE | Admit: 2022-03-21 | Payer: Medicare Other | Source: Ambulatory Visit

## 2022-03-21 DIAGNOSIS — C50412 Malignant neoplasm of upper-outer quadrant of left female breast: Secondary | ICD-10-CM

## 2022-03-24 ENCOUNTER — Inpatient Hospital Stay: Payer: Medicare Other | Attending: Hematology and Oncology | Admitting: Hematology and Oncology

## 2022-03-24 DIAGNOSIS — C50412 Malignant neoplasm of upper-outer quadrant of left female breast: Secondary | ICD-10-CM

## 2022-03-24 DIAGNOSIS — Z170 Estrogen receptor positive status [ER+]: Secondary | ICD-10-CM | POA: Diagnosis not present

## 2022-03-24 NOTE — Assessment & Plan Note (Addendum)
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 ?? ? ?Treatment plan: ?1.??Adjuvant radiation therapy?09/09/2020-10/06/2020 ?2.?antiestrogen therapy was recommended but patient decided not to take it. ?? ?Bone density 06/01/2021: T score 0.2: Normal ?? ?Breast cancer surveillance: ?1.  Breast exam 02/20/2022: Benign, left breast tenderness as well as scar tissue is palpable.  No axillary lymph nodes ?2. mammogram: at [facility]: Benign ?? ?Return to clinic in 1 year for follow-up ?

## 2022-03-28 ENCOUNTER — Encounter: Payer: Self-pay | Admitting: Internal Medicine

## 2022-04-04 ENCOUNTER — Telehealth: Payer: Self-pay | Admitting: *Deleted

## 2022-04-04 NOTE — Telephone Encounter (Signed)
Patient wants a handicap placecard. She wants to know can it be signed, then picked up or does she have to be seen first? ?

## 2022-04-05 ENCOUNTER — Telehealth: Payer: Self-pay | Admitting: Internal Medicine

## 2022-04-05 NOTE — Telephone Encounter (Signed)
Notified of message from Dr. Wynetta Emery.  ?Agreeable to bring in at next OV.  ?

## 2022-04-05 NOTE — Telephone Encounter (Signed)
Copied from [place] [id number]. Topic: General - Inquiry ?>> Apr 04, 2022 10:17 AM Robina Ade, Helene Kelp D wrote: ?Reason for CRM: Patient called and would like to know if Dr. Wynetta Emery can sign a handy cap parking form. In the past her old provider would have form in office and all she had to do is come in to sign it. Please call patient back, thanks. ?

## 2022-04-21 ENCOUNTER — Telehealth: Payer: Self-pay | Admitting: *Deleted

## 2022-04-21 NOTE — Telephone Encounter (Signed)
Pt was scheduled for an in-person PV appt. At 10:30am Pt was a no-show, called pt to see if we could do visit over the phone. No answer and mailbox is full.  Will see if pt calls back to reschedule before 5pm today, otherwise will cancel colon on 6/16

## 2022-04-21 NOTE — Telephone Encounter (Signed)
No return call from pt to reschedule PV, both PV and colon will be cancelled. No show letter sent to MyChart and sent to pt in the mail.

## 2022-04-26 ENCOUNTER — Emergency Department (HOSPITAL_COMMUNITY)
Admission: EM | Admit: 2022-04-26 | Discharge: 2022-04-26 | Disposition: A | Discharge status: Home / Self Care | Payer: Medicare Other | Attending: Emergency Medicine | Admitting: Emergency Medicine

## 2022-04-26 ENCOUNTER — Other Ambulatory Visit: Payer: Self-pay

## 2022-04-26 ENCOUNTER — Encounter (HOSPITAL_COMMUNITY): Payer: Self-pay | Admitting: Emergency Medicine

## 2022-04-26 DIAGNOSIS — M549 Dorsalgia, unspecified: Secondary | ICD-10-CM | POA: Diagnosis present

## 2022-04-26 DIAGNOSIS — M62830 Muscle spasm of back: Secondary | ICD-10-CM | POA: Insufficient documentation

## 2022-04-26 MED ORDER — LIDOCAINE 5 % EX PTCH
1.0000 | MEDICATED_PATCH | Freq: Once | CUTANEOUS | Status: DC
  Administered 2022-04-26: 1 via TRANSDERMAL
  Filled 2022-04-26: qty 1

## 2022-04-26 MED ORDER — IBUPROFEN 200 MG PO TABS
600.0000 mg | ORAL_TABLET | Freq: Once | ORAL | Status: AC
  Administered 2022-04-26: 600 mg via ORAL
  Filled 2022-04-26: qty 3

## 2022-04-26 MED ORDER — METHOCARBAMOL 500 MG PO TABS
500.0000 mg | ORAL_TABLET | Freq: Two times a day (BID) | ORAL | 0 refills | Status: DC
Start: 2022-04-26 — End: 2022-06-16

## 2022-04-26 MED ORDER — LIDOCAINE 5 % EX PTCH: 1.0000 | MEDICATED_PATCH | CUTANEOUS | 0 refills | Status: DC

## 2022-04-26 NOTE — Discharge Instructions (Addendum)
It was a pleasure taking care of you today!   You are prescribed Robaxin and lidocaine patch. Take medications as prescribed.  Do not operate any heavy machinery or drive while taking Robaxin.  You may apply heat to the affected area for up to 15 minutes at a time.  Ensure to place a barrier between your skin and the heat.  Gentle stretching and gentle massaging may help the area.  Call your primary care provider today to set up a follow-up appointment regarding today's ED visit. Return to the Emergency Department if you are experiencing loss of bowel or bladder, increasing/worsening symptoms, fever, inability to walk.

## 2022-04-26 NOTE — ED Provider Notes (Signed)
[facility] DEPT Provider Note   CSN: [id number] Arrival date & time: 04/26/22  1433     History  Chief Complaint  Patient presents with   Back Pain    Deanna Potter is a 71 y.o. female with a PMHx of chronic back pain, who presents to the Emergency Department complaining of back pain onset 1 week.  Patient describes her back pain as a muscle spasm.  Denies recent heavy lifting, injury, trauma, fall.  Has tried over-the-counter medications and prescription narcotics with no relief for her symptoms.  Denies bowel/bladder incontinence, fever, chills, abdominal pain, urinary symptoms.  Denies allergies to medications.    The history is provided by the patient. No language interpreter was used.      Home Medications Prior to Admission medications   Medication Sig Start Date End Date Taking? Authorizing Provider  lidocaine (LIDODERM) 5 % Place 1 patch onto the skin daily. Remove & Discard patch within 12 hours or as directed by MD 04/26/22  Yes Cherylin Waguespack A, PA-C  methocarbamol (ROBAXIN) 500 MG tablet Take 1 tablet (500 mg total) by mouth 2 (two) times daily. 04/26/22  Yes Jahmad Petrich A, PA-C  amLODipine (NORVASC) 5 MG tablet Take 5 mg by mouth daily. 06/16/20   [provider]  escitalopram (LEXAPRO) 10 MG tablet Take 1 tablet by mouth every day 01/25/22   Ladell Pier, MD  escitalopram (LEXAPRO) 20 MG tablet Take 20 mg by mouth daily. 01/31/19   [provider]  hydrOXYzine (ATARAX/VISTARIL) 50 MG tablet Take 50 mg by mouth 3 (three) times daily. 06/16/20   [provider]  latanoprost (XALATAN) 0.005 % ophthalmic solution Place 1 drop into both eyes 2 (two) times daily.     [provider]  letrozole (FEMARA) 2.5 MG tablet Take 1 tablet (2.5 mg total) by mouth daily. 02/20/22   Nicholas Lose, MD  Magnesium 250 MG TABS Take 250 mg by mouth daily.    [provider]  metoprolol succinate (TOPROL-XL) 50 MG 24 hr  tablet Take 1 tablet by mouth every day with a meal 02/06/22   Adrian Prows, MD  ondansetron (ZOFRAN-ODT) 4 MG disintegrating tablet Take 1 tablet (4 mg total) by mouth every 8 (eight) hours as needed for nausea or vomiting. 02/09/22   Kommor, Madison, MD  rosuvastatin (CRESTOR) 5 MG tablet Take 1 tablet by mouth every day 11/29/21   Adrian Prows, MD  sertraline (ZOLOFT) 50 MG tablet Take 1 tablet by mouth daily. 01/10/21   [provider]  traZODone (DESYREL) 100 MG tablet Take 1 to 2 tablets by mouth at bedtime for sleep 01/25/22   Ladell Pier, MD      Allergies    Patient has no known allergies.    Review of Systems   Review of Systems  Constitutional:  Negative for chills and fever.  Gastrointestinal:  Negative for nausea and vomiting.       -bowel incontinence  Genitourinary:        -bladder incontinence  Musculoskeletal:  Positive for back pain. Negative for gait problem.  All other systems reviewed and are negative.  Physical Exam Updated Vital Signs BP 135/79 (BP Location: Right Arm)   Pulse 74   Temp 98.3 F (36.8 C) (Oral)   Resp 15   SpO2 94%  Physical Exam Vitals and nursing note reviewed.  Constitutional:      General: She is not in acute distress.    Appearance: She  is not diaphoretic.  HENT:     Head: Normocephalic and atraumatic.     Mouth/Throat:     Pharynx: No oropharyngeal exudate.  Eyes:     General: No scleral icterus.    Conjunctiva/sclera: Conjunctivae normal.  Cardiovascular:     Rate and Rhythm: Normal rate and regular rhythm.     Pulses: Normal pulses.     Heart sounds: Normal heart sounds.  Pulmonary:     Effort: Pulmonary effort is normal. No respiratory distress.     Breath sounds: Normal breath sounds. No wheezing.  Abdominal:     General: Bowel sounds are normal.     Palpations: Abdomen is soft. There is no mass.     Tenderness: There is no abdominal tenderness. There is no guarding or rebound.  Musculoskeletal:        General:  Normal range of motion.     Cervical back: Normal range of motion and neck supple.     Comments: TTP noted to right thoracic musculature of back. No spinal TTP.   Skin:    General: Skin is warm and dry.  Neurological:     Mental Status: She is alert.  Psychiatric:        Behavior: Behavior normal.    ED Results / Procedures / Treatments   Labs (all labs ordered are listed, but only abnormal results are displayed) Labs Reviewed - No data to display  EKG None  Radiology No results found.  Procedures Procedures    Medications Ordered in ED Medications  ibuprofen (ADVIL) tablet 600 mg (600 mg Oral Given 04/26/22 1548)    ED Course/ Medical Decision Making/ A&P                           Medical Decision Making Risk OTC drugs. Prescription drug management.   Patient with back pain onset 1 week.  Feels like a muscle spasm to patient.  Denies history of sciatica.  No neurological deficits noted on exam.  Patient ambulatory without assistance or difficulty.  No loss of bowel or bladder control.  No history of cancer or IV drug use.  No urinary symptoms suggestive of UTI today.  Vital signs stable, patient afebrile.  On exam patient with tenderness to palpation noted to right thoracic musculature.  No spinal tenderness to palpation.  Negative straight leg raise bilaterally.  No acute cardiovascular or respiratory or abdominal exam findings. Differential diagnosis includes but is not limited to fracture, herniation, muscle strain, cauda equina, acute cystitis, pyelonephritis, nephrolithiasis.    Medications:  I ordered medication including Lidoderm patch and ibuprofen for pain management Reevaluation of the patient after these medicines and interventions, I reevaluated the patient and found that they have improved I have reviewed the patients home medicines and have made adjustments as needed   Disposition: Presentation suspicious for muscle spasm of back.  Doubt fracture,  herniation, cauda equina, acute cystitis, pyelonephritis or nephrolithiasis at this time.  After consideration of the diagnostic results and the patients response to treatment, I feel that the patient would benefit from Discharge home.  Patient will be sent a prescription for Robaxin and Lidoderm patches.  Discussed importance of follow-up with primary care provider for management. Supportive care measures and strict return precautions discussed with patient at bedside. Pt acknowledges and verbalizes understanding. Pt appears safe for discharge. Follow up as indicated in discharge paperwork.    This chart was dictated using voice recognition software, Dragon.  Despite the best efforts of this provider to proofread and correct errors, errors may still occur which can change documentation meaning.   Final Clinical Impression(s) / ED Diagnoses Final diagnoses:  Muscle spasm of back    Rx / DC Orders ED Discharge Orders          Ordered    methocarbamol (ROBAXIN) 500 MG tablet  2 times daily        04/26/22 1540    lidocaine (LIDODERM) 5 %  Every 24 hours        04/26/22 1540              Xavier Munger A, PA-C 04/26/22 2356    Drenda Freeze, MD 04/27/22 [phone]

## 2022-04-26 NOTE — ED Triage Notes (Signed)
Pt reports back pain x 1 week. Pt reports muscle spasms.

## 2022-04-27 ENCOUNTER — Ambulatory Visit: Payer: Self-pay | Admitting: *Deleted

## 2022-04-27 NOTE — Telephone Encounter (Signed)
  Chief Complaint: severe back pain- medication not helping Symptoms: muscle spasms in back- uncontrolled pain Frequency: 8 days Pertinent Negatives: Patient denies   Disposition: '[x]'$ ED /'[]'$ Urgent Care (no appt availability in office) / '[]'$ Appointment(In office/virtual)/ '[]'$  [facility]/ '[]'$ Home Care/ '[]'$ Refused Recommended Disposition /'[]'$ [facility]/ '[]'$  Follow-up with PCP Additional Notes: Patient is calling to request appointment- hospital/ED follow up- she was seen for back spasm- medication given is not controlling pain and narcotic for pain discussed was not sent to pharmacy. Patient advised PCP can not send in narcotic without seeing her- advised patient she may need to go back to ED. Note sent to request hospital follow up appointment.

## 2022-04-27 NOTE — Telephone Encounter (Signed)
Reason for Disposition  [1] SEVERE back pain (e.g., excruciating, unable to do any normal activities) AND [2] not improved 2 hours after pain medicine  Answer Assessment - Initial Assessment Questions 1. ONSET: "When did the pain begin?"      8 days ago- muscle spasm 2. LOCATION: "Where does it hurt?" (upper, mid or lower back)     R mid back 3. SEVERITY: "How bad is the pain?"  (e.g., Scale 1-10; mild, moderate, or severe)   - MILD (1-3): doesn't interfere with normal activities    - MODERATE (4-7): interferes with normal activities or awakens from sleep    - SEVERE (8-10): excruciating pain, unable to do any normal activities      severe 4. PATTERN: "Is the pain constant?" (e.g., yes, no; constant, intermittent)      Constant- hurts worse when moves 5. RADIATION: "Does the pain shoot into your legs or elsewhere?"     no 6. CAUSE:  "What do you think is causing the back pain?"      Diagnosed -muscle spasm- yesterday- ED 7. BACK OVERUSE:  "Any recent lifting of heavy objects, strenuous work or exercise?"     Disc problem 8. MEDICATIONS: "What have you taken so far for the pain?" (e.g., nothing, acetaminophen, NSAIDS)     Muscle pain patch 9. NEUROLOGIC SYMPTOMS: "Do you have any weakness, numbness, or problems with bowel/bladder control?"     *No Answer* 10. OTHER SYMPTOMS: "Do you have any other symptoms?" (e.g., fever, abdominal pain, burning with urination, blood in urine)       *No Answer* 11. PREGNANCY: "Is there any chance you are pregnant?" (e.g., yes, no; LMP)       *No Answer*  Protocols used: Back Pain-A-AH

## 2022-05-01 NOTE — Telephone Encounter (Signed)
Pt was called and she states that she has been taking the muscle relaxer's and use the patches and still having pain.

## 2022-05-04 NOTE — Telephone Encounter (Signed)
Call placed to patient and VM was left informing patient to call office and schedule an appointment.

## 2022-05-07 ENCOUNTER — Other Ambulatory Visit: Payer: Self-pay | Admitting: Internal Medicine

## 2022-05-12 ENCOUNTER — Encounter: Payer: Medicare Other | Admitting: Gastroenterology

## 2022-06-13 DIAGNOSIS — M5459 Other low back pain: Secondary | ICD-10-CM | POA: Diagnosis not present

## 2022-06-14 DIAGNOSIS — M5451 Vertebrogenic low back pain: Secondary | ICD-10-CM | POA: Diagnosis not present

## 2022-06-15 ENCOUNTER — Encounter: Payer: Self-pay | Admitting: Student

## 2022-06-15 ENCOUNTER — Ambulatory Visit: Payer: Medicare Other | Admitting: Student

## 2022-06-15 VITALS — BP 143/74 | HR 75 | Temp 97.8°F | Resp 17 | Ht 61.0 in | Wt 208.0 lb

## 2022-06-15 DIAGNOSIS — I119 Hypertensive heart disease without heart failure: Secondary | ICD-10-CM | POA: Diagnosis not present

## 2022-06-15 DIAGNOSIS — I10 Essential (primary) hypertension: Secondary | ICD-10-CM | POA: Diagnosis not present

## 2022-06-15 DIAGNOSIS — Z01810 Encounter for preprocedural cardiovascular examination: Secondary | ICD-10-CM

## 2022-06-15 NOTE — Progress Notes (Signed)
Primary Physician/Referring:  Ladell Pier, MD  Patient ID: Deanna Potter, female    DOB: 07-19-1951, 71 y.o.   MRN: [id number]  Chief Complaint  Patient presents with   surgical clearance   HPI:    Deanna Potter  is a 71 y.o. Caucasian female patient with hypertension, chronic palpitations, very mild hyperlipidemia, obstructive sleep apnea not using CPAP, moved from [place] in 2019.  She was also told to have normal nuclear stress test at that time.  On 07/19/2020 underwent left breast lumpectomy and underwent radiation therapy on 10/11/2020.  She was not recommended any adjuvant chemotherapy.    Patient was last seen in the office 02/23/2022 at which time she was stable from a cardiac standpoint advised to follow-up in 1 year.  She however presents sooner than previously scheduled for preoperative risk stratification.  Patient is tentatively planning to undergo lumbar fusion by Dr. Rolena Infante with [facility].  Unfortunately due to back pain patient's physical activity is significantly limited, she has no formal exercise routine and does not exert herself regularly.  Denies chest pain, dyspnea, PND, orthopnea, palpitations.   Past Medical History:  Diagnosis Date   Anxiety    Cancer ([place]) 03/21/2020   Breast Cancer   Cardiomegaly 03/13/2019   Glaucoma    HTN (hypertension)    OSA on CPAP    Wears hearing aid    Past Surgical History:  Procedure Laterality Date   BREAST LUMPECTOMY WITH RADIOACTIVE SEED LOCALIZATION Left 07/19/2020   Procedure: LEFT BREAST LUMPECTOMY WITH RADIOACTIVE SEED LOCALIZATION;  Surgeon: Jovita Kussmaul, MD;  Location: [facility];  Service: General;  Laterality: Left;   BREAST REDUCTION SURGERY Bilateral 07/19/2020   Procedure: BILATERAL MAMMARY REDUCTION  (BREAST) ONCO PLASTIC FOR LEFT BREAST CANCER;  Surgeon: Wallace Going, DO;  Location: [facility];  Service: Plastics;  Laterality: Bilateral;   EYE SURGERY Bilateral    PARTIAL HYSTERECTOMY     Social  History   Tobacco Use   Smoking status: Never   Smokeless tobacco: Never  Substance Use Topics   Alcohol use: Not Currently  Marital status: Widowed    ROS  Review of Systems  HENT:  Positive for hearing loss (moderate, uses hearing aids).   Eyes:  Positive for visual disturbance (glaucoma).  Cardiovascular:  Positive for palpitations (no recurrence since last visit). Negative for dyspnea on exertion, leg swelling and syncope.  Respiratory:  Positive for snoring (Using CPAP ).   Musculoskeletal:  Positive for back pain (chronic).  All other systems reviewed and are negative.   Objective      06/15/2022    2:34 PM 04/26/2022    2:41 PM 02/23/2022    3:40 PM  Vitals with BMI  Height 5' 1"     Weight 208 lbs    BMI 90.38    Systolic 333 832 919  Diastolic 74 79 70  Pulse 75 74 79      Physical Exam Vitals reviewed.  Constitutional:      Appearance: She is obese.     Comments: Short stature, moderately obese in no acute distress.  Neck:     Thyroid: No thyromegaly.     Vascular: No JVD.  Cardiovascular:     Rate and Rhythm: Normal rate and regular rhythm.     Pulses: Intact distal pulses.     Heart sounds: Normal heart sounds. No murmur heard.    No gallop.     Comments: No JVD. Pulmonary:  Effort: Pulmonary effort is normal.     Breath sounds: Normal breath sounds.  Musculoskeletal:     Cervical back: Neck supple.     Right lower leg: No edema.     Left lower leg: No edema.    Laboratory examination:   Recent Labs    12/30/21 1525 02/09/22 1454  NA 136 139  K 3.6 3.8  CL 105 104  CO2 23 26  GLUCOSE 96 110*  BUN 14 12  CREATININE 1.00 0.96  CALCIUM 9.9 10.3  GFRNONAA >60 >60   CrCl cannot be calculated (Patient's most recent lab result is older than the maximum 21 days allowed.).     Latest Ref Rng & Units 02/09/2022    2:54 PM 01/10/2022   10:15 AM 12/30/2021    3:25 PM  CMP  Glucose 70 - 99 mg/dL 110   96   BUN 8 - 23 mg/dL 12   14    Creatinine 0.44 - 1.00 mg/dL 0.96   1.00   Sodium 135 - 145 mmol/L 139   136   Potassium 3.5 - 5.1 mmol/L 3.8   3.6   Chloride 98 - 111 mmol/L 104   105   CO2 22 - 32 mmol/L 26   23   Calcium 8.9 - 10.3 mg/dL 10.3   9.9   Total Protein 6.5 - 8.1 g/dL 8.0  7.0    Total Bilirubin 0.3 - 1.2 mg/dL 0.5  0.2    Alkaline Phos 38 - 126 U/L 74  95    AST 15 - 41 U/L 28  17    ALT 0 - 44 U/L 26  14        Latest Ref Rng & Units 02/09/2022    2:54 PM 12/30/2021    3:25 PM 06/06/2021   11:33 PM  CBC  WBC 4.0 - 10.5 K/uL 10.4  9.8  11.8   Hemoglobin 12.0 - 15.0 g/dL 13.9  13.4  13.5   Hematocrit 36.0 - 46.0 % 42.2  40.3  41.5   Platelets 150 - 400 K/uL 330  334  336    Lipid Panel     Component Value Date/Time   CHOL 143 01/10/2022 1015   TRIG 101 01/10/2022 1015   HDL 43 01/10/2022 1015   CHOLHDL 3.3 01/10/2022 1015   LDLCALC 81 01/10/2022 1015   HEMOGLOBIN A1C Lab Results  Component Value Date   HGBA1C 6.6 (H) 01/10/2022   TSH Recent Labs    02/09/22 1518  TSH 1.112     External Labs: 02/03/2019:  HB 13.5/HCT 40.9, normal indicis, platelets 184.  BUN 12, creatinine 0.92, eGFR 65 mL.  Potassium 4.1.  CMP normal.  Total cholesterol 179, triglycerides 121, HDL 60, LDL 107.  Non-HDL cholesterol 129.  Allergies  No Known Allergies   Medications Prior to Visit:   Outpatient Medications Prior to Visit  Medication Sig Dispense Refill   amLODipine (NORVASC) 5 MG tablet Take 5 mg by mouth daily.     escitalopram (LEXAPRO) 10 MG tablet Take 1 tablet by mouth every day 30 tablet 1   latanoprost (XALATAN) 0.005 % ophthalmic solution Place 1 drop into both eyes 2 (two) times daily.      metoprolol succinate (TOPROL-XL) 50 MG 24 hr tablet Take 1 tablet by mouth every day with a meal 30 tablet 11   rosuvastatin (CRESTOR) 5 MG tablet Take 1 tablet by mouth every day 30 tablet 11   traZODone (DESYREL) 100  MG tablet Take 1 to 2 tablets by mouth at bedtime for sleep 60 tablet 1    hydrOXYzine (ATARAX/VISTARIL) 50 MG tablet Take 50 mg by mouth 3 (three) times daily.     letrozole (FEMARA) 2.5 MG tablet Take 1 tablet (2.5 mg total) by mouth daily. 30 tablet 0   lidocaine (LIDODERM) 5 % Place 1 patch onto the skin daily. Remove & Discard patch within 12 hours or as directed by MD 30 patch 0   Magnesium 250 MG TABS Take 250 mg by mouth daily.     methocarbamol (ROBAXIN) 500 MG tablet Take 1 tablet (500 mg total) by mouth 2 (two) times daily. 20 tablet 0   ondansetron (ZOFRAN-ODT) 4 MG disintegrating tablet Take 1 tablet (4 mg total) by mouth every 8 (eight) hours as needed for nausea or vomiting. 20 tablet 0   No facility-administered medications prior to visit.   Final Medications at End of Visit    Current Meds  Medication Sig   amLODipine (NORVASC) 5 MG tablet Take 5 mg by mouth daily.   escitalopram (LEXAPRO) 10 MG tablet Take 1 tablet by mouth every day   latanoprost (XALATAN) 0.005 % ophthalmic solution Place 1 drop into both eyes 2 (two) times daily.    metoprolol succinate (TOPROL-XL) 50 MG 24 hr tablet Take 1 tablet by mouth every day with a meal   rosuvastatin (CRESTOR) 5 MG tablet Take 1 tablet by mouth every day   traZODone (DESYREL) 100 MG tablet Take 1 to 2 tablets by mouth at bedtime for sleep   Radiology:  No results found.  Cardiac Studies:   Echocardiogram 08/05/2020: Normal LV systolic function with visual EF 50-55%. Left ventricle cavity is normal in size. Severe left ventricular hypertrophy. Normal global wall motion. Normal diastolic filling pattern, normal LAP. Calculated EF 55%. Mild (Grade I) aortic regurgitation. Mild tricuspid regurgitation. No evidence of pulmonary hypertension. No prior study for comparison.  EKG  06/16/2022: Sinus rhythm at a rate of 59 bpm with first-degree AV block.  Left axis, left anterior fascicular block.  LVH.  Compared to EKG 02/23/2022, unchanged.  EKG 10/28/2020: Normal sinus rhythm with rate of 77 bpm, left  axis deviation, left anterior fascicular block.  Marked LVH by voltage and IVCD.  Abnormal EKG.   EKG 10/28/2019: Sinus rhythm with borderline first-degree AV block at the rate of 69 bpm, left atrial enlargement, marked high-voltage and IVCD, LVH with repolarization abnormality with ST-T wave changes in the lateral leads.  Normal QT interval.    Assessment     ICD-10-CM   1. Essential hypertension  I10 EKG 12-Lead    2. Preoperative cardiovascular examination  Z01.810 PCV MYOCARDIAL PERFUSION WITH LEXISCAN    3. Hypertensive left ventricular hypertrophy, without heart failure  I11.9       No orders of the defined types were placed in this encounter.  There are no discontinued medications.    Recommendations:   Deanna Potter  is a 71 y.o. Caucasian female patient with hypertension, chronic palpitations, very mild hyperlipidemia, obstructive sleep apnea now using CPAP, moved from [place] in 2019.  She was also told to have normal nuclear stress test at that time.  On 07/19/2020 underwent left breast lumpectomy and underwent radiation therapy on 10/11/2020.  She was not recommended any adjuvant chemotherapy.   Patient was last seen in the office 02/23/2022 at which time she was stable from a cardiac standpoint advised to follow-up in 1 year.  She however presents  sooner than previously scheduled for preoperative risk stratification. Reviewed and discussed patient's echo from 2021, which revealed preserved LVEF.  Patient remains asymptomatic from a cardiac standpoint, however due to poor functional capacity it is difficult to risk stratify patient for upcoming surgery.  Therefore shared decision was to proceed with nuclear stress testing, she is not a treadmill candidate due to back pain.  As long as stress test is without significant abnormalities patient may proceed with lumbar fusion with Dr. Rolena Infante from [facility] at low risk from a cardiac standpoint.  Blood pressure is mildly elevated in the  office today, however she reports readings at home which are well controlled and she is in significant back pain at this time. Will not make changes to medications.   Further recommendations pending results of cardiac testing.  We will keep follow-up appointment as previously scheduled.   Alethia Berthold, PA-C 06/16/2022, 8:58 AM Office: [phone]

## 2022-06-16 ENCOUNTER — Ambulatory Visit: Payer: Medicare Other | Attending: Internal Medicine | Admitting: Internal Medicine

## 2022-06-16 ENCOUNTER — Encounter: Payer: Self-pay | Admitting: Internal Medicine

## 2022-06-16 VITALS — BP 124/80 | HR 63 | Temp 98.5°F | Ht 60.0 in | Wt 209.2 lb

## 2022-06-16 DIAGNOSIS — Z9181 History of falling: Secondary | ICD-10-CM

## 2022-06-16 DIAGNOSIS — Z2821 Immunization not carried out because of patient refusal: Secondary | ICD-10-CM | POA: Diagnosis not present

## 2022-06-16 DIAGNOSIS — F439 Reaction to severe stress, unspecified: Secondary | ICD-10-CM | POA: Diagnosis not present

## 2022-06-16 DIAGNOSIS — R195 Other fecal abnormalities: Secondary | ICD-10-CM | POA: Diagnosis not present

## 2022-06-16 DIAGNOSIS — Z7189 Other specified counseling: Secondary | ICD-10-CM | POA: Diagnosis not present

## 2022-06-16 DIAGNOSIS — Z0001 Encounter for general adult medical examination with abnormal findings: Secondary | ICD-10-CM

## 2022-06-16 DIAGNOSIS — M5416 Radiculopathy, lumbar region: Secondary | ICD-10-CM | POA: Diagnosis not present

## 2022-06-16 DIAGNOSIS — Z0000 Encounter for general adult medical examination without abnormal findings: Secondary | ICD-10-CM

## 2022-06-16 DIAGNOSIS — N3946 Mixed incontinence: Secondary | ICD-10-CM

## 2022-06-16 DIAGNOSIS — R269 Unspecified abnormalities of gait and mobility: Secondary | ICD-10-CM | POA: Diagnosis not present

## 2022-06-16 MED ORDER — SOLIFENACIN SUCCINATE 5 MG PO TABS: 5.0000 mg | ORAL_TABLET | Freq: Every day | ORAL | 1 refills | Status: DC

## 2022-06-16 NOTE — Progress Notes (Signed)
Subjective:    Deanna Potter is a 71 y.o. female who presents for a Welcome to Medicare exam. Her son is with her. Patient with history of HTN (LV hypertrophy w/out CHF followed by cardiology Dr. Einar Gip), HL, OSA on CPAP, left breast CA ERP s/p lumpectomy and XRT (dx 06/2020),  hearing impairment wears hearing aids, anx/dep, DM/morbid obesity, + cologuard.    Review of Systems  MSK: Patient complains of feeling off balance and missed judging depth when walking on uneven surfaces or climbing stairs for over a year.  It has gotten worse over the past several months.  She has stairs in her house and avoids going up the stairs for this reason because of fear of falling.  She has had 2 falls in the past 6 months.  One occurred when she was attempting to get out of the bathtub.  She did not raise her leg high enough to clear the tub and ended up falling.  No problems getting up from a chair.  No problems walking on flat even surfaces She has prescription bifocals but do not wear them all the time.  She has them with her today and has it folded over on her T-shirt instead of on the face. Denies any dizziness. Last CBC revealed normal H/H. She gets some soreness in the knees sometimes but it is not a major issue. She has lumbar radicular symptoms with pain in the back radiating down both legs but worse on the left side.  She will be having lumbar fusion surgery with Dr. Rolena Infante within the next several weeks after she is cleared by her cardiologist. -She feels that the problem with her gait/balance worsen when her back issues became worse. GI: History of positive Cologuard test.  Referred to gastroenterology on last visit with me.  They had quite a time getting in contact with her.  She was set up to have it done and then she canceled.  She tells me that she will get it done after her back surgery.     Objective:    Today's Vitals   06/16/22 1537  BP: 124/80  Pulse: 63  Temp: 98.5 F (36.9 C)   TempSrc: Oral  SpO2: 95%  Weight: 209 lb 3.2 oz (94.9 kg)  Height: 5' (1.524 m)  PainSc: 7   Body mass index is 40.86 kg/m. Sitting: BP 136/79, pulse 56 Standing: BP 146/79, pulse 63 Medications Outpatient Encounter Medications as of 06/16/2022  Medication Sig   amLODipine (NORVASC) 5 MG tablet Take 5 mg by mouth daily.   escitalopram (LEXAPRO) 10 MG tablet Take 1 tablet by mouth every day   rosuvastatin (CRESTOR) 5 MG tablet Take 1 tablet by mouth every day   traZODone (DESYREL) 100 MG tablet Take 1 to 2 tablets by mouth at bedtime for sleep   hydrOXYzine (ATARAX/VISTARIL) 50 MG tablet Take 50 mg by mouth 3 (three) times daily. (Patient not taking: Reported on 06/16/2022)   latanoprost (XALATAN) 0.005 % ophthalmic solution Place 1 drop into both eyes 2 (two) times daily.  (Patient not taking: Reported on 06/16/2022)   letrozole (FEMARA) 2.5 MG tablet Take 1 tablet (2.5 mg total) by mouth daily. (Patient not taking: Reported on 06/16/2022)   lidocaine (LIDODERM) 5 % Place 1 patch onto the skin daily. Remove & Discard patch within 12 hours or as directed by MD (Patient not taking: Reported on 06/16/2022)   Magnesium 250 MG TABS Take 250 mg by mouth daily. (Patient not taking:  Reported on 06/16/2022)   methocarbamol (ROBAXIN) 500 MG tablet Take 1 tablet (500 mg total) by mouth 2 (two) times daily. (Patient not taking: Reported on 06/16/2022)   metoprolol succinate (TOPROL-XL) 50 MG 24 hr tablet Take 1 tablet by mouth every day with a meal (Patient not taking: Reported on 06/16/2022)   ondansetron (ZOFRAN-ODT) 4 MG disintegrating tablet Take 1 tablet (4 mg total) by mouth every 8 (eight) hours as needed for nausea or vomiting. (Patient not taking: Reported on 06/16/2022)   No facility-administered encounter medications on file as of 06/16/2022.     History: Past Medical History:  Diagnosis Date   Anxiety    Cancer ([place]) 03/21/2020   Breast Cancer   Cardiomegaly 03/13/2019   Glaucoma    HTN  (hypertension)    OSA on CPAP    Wears hearing aid    Past Surgical History:  Procedure Laterality Date   BREAST LUMPECTOMY WITH RADIOACTIVE SEED LOCALIZATION Left 07/19/2020   Procedure: LEFT BREAST LUMPECTOMY WITH RADIOACTIVE SEED LOCALIZATION;  Surgeon: Jovita Kussmaul, MD;  Location: [facility];  Service: General;  Laterality: Left;   BREAST REDUCTION SURGERY Bilateral 07/19/2020   Procedure: BILATERAL MAMMARY REDUCTION  (BREAST) ONCO PLASTIC FOR LEFT BREAST CANCER;  Surgeon: Wallace Going, DO;  Location: [facility];  Service: Plastics;  Laterality: Bilateral;   EYE SURGERY Bilateral    PARTIAL HYSTERECTOMY      Family History  Problem Relation Age of Onset   Stomach cancer Brother    Social History   Occupational History   Not on file  Tobacco Use   Smoking status: Never   Smokeless tobacco: Never  Vaping Use   Vaping Use: Never used  Substance and Sexual Activity   Alcohol use: Not Currently   Drug use: Never   Sexual activity: Not on file    Tobacco Counseling -non smoker -does not drink ETOH beverages at all  Immunizations and Health Maintenance Immunization History  Administered Date(s) Administered   Influenza,inj,Quad PF,6+ Mos 01/10/2022   Tdap 12/27/2021  Declines Shingrix Declines PCV 20 COVID19 - had 2 vaccines but does not have her card with her for me to update it in the health maintenance.  Health Maintenance Due  Topic Date Due   COVID-19 Vaccine (1) Never done   FOOT EXAM  Never done   OPHTHALMOLOGY EXAM  Never done   Hepatitis C Screening  Never done   Zoster Vaccines- Shingrix (1 of 2) Never done   DEXA SCAN  Never done  Reports having had her eye exam in February of this year. Reports having had a DEXA scan within the last year.  Her oncologist mentioned it in his last note that she did have a DEXA scan 06/01/2021 and that it was normal.  Patient does not recall where she had it done.  May have been through [facility] mammography  Activities of Daily  Living    06/16/2022    3:40 PM  In your present state of health, do you have any difficulty performing the following activities:  Hearing? 1  Vision? 1  Difficulty concentrating or making decisions? 0  Walking or climbing stairs? 1  Dressing or bathing? 0  Doing errands, shopping? 0  Preparing Food and eating ? N  Using the Toilet? N  In the past six months, have you accidently leaked urine? Y  Do you have problems with loss of bowel control? N  Managing your Medications? N  Managing your Finances? N  Housekeeping or managing your Housekeeping? N  She endorses leakage of urine with coughing, laughing or sneezing.  Also when she has the urge to urinate she had some leakage 50% of the time before she gets to the restroom.  She does Kegel's exercises.  She has not found it to be helpful.  Physical Exam  (optional), or other factors deemed appropriate based on the beneficiary's medical and social history and current clinical standards. Constitutional: Appears well-developed and well-nourished. No distress. Mouth: Oral mucosa is moist.   Neck: Neck supple.  no cervical LN CVS: RRR, S1/S2 +, no murmurs, no gallops,  Pulmonary: Effort and breath sounds normal, no stridor, rhonchi, wheezes, rales.  Musculoskeletal: Knees: Joints somewhat enlarged.  Slight crepitus on passive range of motion.  Good range of motion.  .  Neuro: Alert and oriented x3.  Cns grossly intact,  Grip 4+/5 bilaterally. Power upper extremities: 5/5 RT and 4+/5 LT proximally and distally. Power LE: 4+/5 RT and 4/5 LT proximally and distally. Gross sensation intact in the upper and lower extremities. Positive Romberg. Gait: She is knocked kneed.  She has low foot to floor clearance and intermittently appears to stumble over one foot or the other  Advanced Directives: Does Patient Have a Medical Advance Directive?: No Would patient like information on creating a medical advance directive?: Yes (ED - Information  included in AVS) Discussed advanced directive with her including living will and healthcare power of attorney.  Patient given packet to take home to review.  I told her how to execute a living will or healthcare power of attorney.     Assessment:    This is a routine wellness examination for this patient .   Vision/Hearing screen Vision Screening   Right eye Left eye Both eyes  Without correction 20/40 20/40   With correction 20/30 20/30     Dietary issues and exercise activities discussed:  Current Exercise Habits: The patient does not participate in regular exercise at present, Exercise limited by: None identified   Goals   None   Depression Screen    06/16/2022    3:40 PM 01/10/2022    9:13 AM  PHQ 2/9 Scores  PHQ - 2 Score 0 0     Fall Risk    06/16/2022    3:39 PM  [place] in the past year? 1  Number falls in past yr: 1  Injury with Fall? 0  Follow up Falls evaluation completed  [place] 2 times in past yr.  Did not raise feet up high enough when getting out of tub -02/2022.     Cognitive Function:        Patient Care Team: Ladell Pier, MD as PCP - General (Internal Medicine) Mauro Kaufmann, RN as Oncology Nurse Navigator Rockwell Germany, RN as Oncology Nurse Navigator Nicholas Lose, MD as Consulting Physician (Hematology and Oncology) Jovita Kussmaul, MD as Consulting Physician (General Surgery) Gery Pray, MD as Consulting Physician (Radiation Oncology)     Plan:    1. Encounter for Medicare annual wellness exam   2. Advance directive discussed with patient See discussion above.  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  Given option of being tried with medication or being referred for pelvic physical therapy.  Patient decided to try medication.  We will put her on low-dose of Vesicare.  Advised to stop the medicine if she develops urinary retention - solifenacin (VESICARE) 5 MG tablet;  Take 1  tablet (5 mg total) by mouth daily.  Dispense: 30 tablet; Refill: 1  4. Gait disturbance 5. History of recent fall -Differential diagnoses include vision abnormality versus musculoskeletal versus neurologic issue.  Advised patient to wear her prescription glasses at all times especially when maneuvering steps or walking on uneven surfaces. -We will get CAT scan of the head to evaluate for any silent stroke that she may have had. Her back issue is likely also playing a role. We will refer for physical therapy for gait training and safety. Recommended a shower chair or bench.  Patient states that she only has a tub that is also a shower and the tub is too small to accommodate a chair which she was agreeable to getting a bandage. - CT HEAD WO CONTRAST (5MM); Future - Ambulatory referral to Physical Therapy - For home use only DME Other see comment  6. Lumbar radiculopathy She will schedule with me in the next 2 to 4 weeks for preoperative evaluation for upcoming surgery.  7. Positive colorectal cancer screening using Cologuard test Encourage patient to get the colonoscopy done to screen for colon cancer.  8. Pneumococcal vaccination declined  I have personally reviewed and noted the following in the patient's chart:   Medical and social history Use of alcohol, tobacco or illicit drugs  Current medications and supplements Functional ability and status Nutritional status Physical activity Advanced directives List of other physicians Hospitalizations, surgeries, and ER visits in previous 12 months Vitals Screenings to include cognitive, depression, and falls Referrals and appointments  In addition, I have reviewed and discussed with patient certain preventive protocols, quality metrics, and best practice recommendations. A written personalized care plan for preventive services as well as general preventive health recommendations were provided to patient.     Karle Plumber,  MD 06/16/2022

## 2022-06-16 NOTE — Patient Instructions (Signed)
I have given you the packet to review about advanced directives.  If you do execute an advance directive, please bring a copy for our records.  I have given you the prescription to get a shower bench.  You can take it to any medical supply store.  I have sent a prescription to your pharmacy for the medication called Vesicare to help decrease the urinary incontinence.  I have ordered a CAT scan of your head and referred you for physical therapy for gait safety.  Please wear your glasses at all times.

## 2022-06-16 NOTE — Progress Notes (Signed)
Balance is off.

## 2022-06-20 DIAGNOSIS — M4310 Spondylolisthesis, site unspecified: Secondary | ICD-10-CM | POA: Diagnosis not present

## 2022-06-20 DIAGNOSIS — M5451 Vertebrogenic low back pain: Secondary | ICD-10-CM | POA: Diagnosis not present

## 2022-06-23 ENCOUNTER — Other Ambulatory Visit: Payer: Self-pay

## 2022-06-24 IMAGING — DX DG CHEST 1V PORT
1 series · 1 of 1 positions shown · non-contrast
Comparison: None.

CLINICAL DATA: Increasing shortness of breath over the past 2 days

EXAM:
PORTABLE CHEST 1 VIEW

[chest ap]
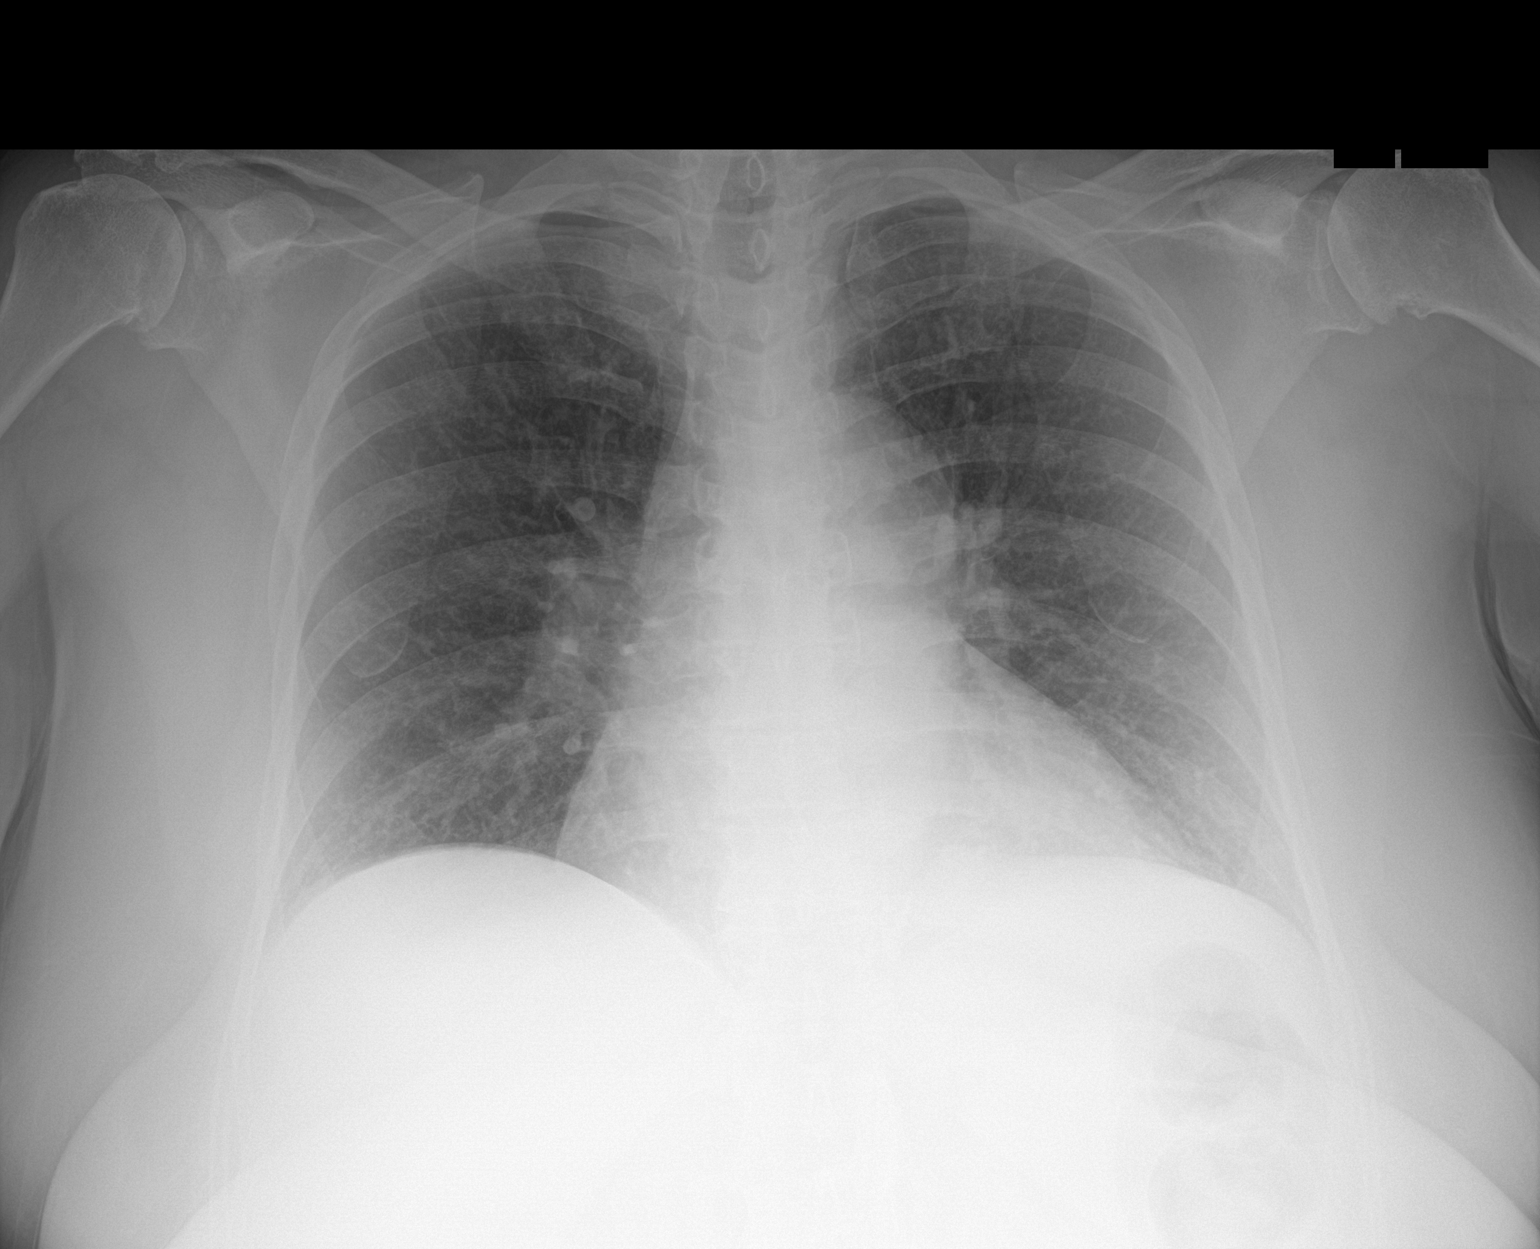

[1 of 1 positions shown; findings below may reference images not displayed]

FINDINGS: Cardiac shadow is at the upper limits of normal in size. The lungs
are well aerated bilaterally. No focal infiltrate or effusion is
seen. No bony abnormality is noted.
IMPRESSION: No active disease.

## 2022-06-27 ENCOUNTER — Ambulatory Visit: Payer: Medicare Other

## 2022-06-27 NOTE — Therapy (Deleted)
OUTPATIENT PHYSICAL THERAPY NEURO EVALUATION   Patient Name: Deanna Potter MRN: [id number] DOB:[birth year], 71 y.o., female Today's Date: 06/27/2022   PCP: Ladell Pier, MD REFERRING PROVIDER: Ladell Pier, MD    Past Medical History:  Diagnosis Date   Anxiety    Cancer ([place]) 03/21/2020   Breast Cancer   Cardiomegaly 03/13/2019   Glaucoma    HTN (hypertension)    Lumbar spine pain    OSA on CPAP    Wears hearing aid    Past Surgical History:  Procedure Laterality Date   BREAST LUMPECTOMY WITH RADIOACTIVE SEED LOCALIZATION Left 07/19/2020   Procedure: LEFT BREAST LUMPECTOMY WITH RADIOACTIVE SEED LOCALIZATION;  Surgeon: Jovita Kussmaul, MD;  Location: [facility];  Service: General;  Laterality: Left;   BREAST REDUCTION SURGERY Bilateral 07/19/2020   Procedure: BILATERAL MAMMARY REDUCTION  (BREAST) ONCO PLASTIC FOR LEFT BREAST CANCER;  Surgeon: Wallace Going, DO;  Location: [facility];  Service: Plastics;  Laterality: Bilateral;   EYE SURGERY Bilateral    PARTIAL HYSTERECTOMY     Patient Active Problem List   Diagnosis Date Noted   Type 2 diabetes mellitus with morbid obesity ([place]) 01/10/2022   Hyperlipidemia 01/10/2022   GAD (generalized anxiety disorder) 01/10/2022   Uses hearing aid 01/10/2022   Positive colorectal cancer screening using Cologuard test 01/10/2022   Hx of radiation therapy 11/02/2020   S/P bilateral breast reduction 07/27/2020   Malignant neoplasm of upper-outer quadrant of left breast in female, estrogen receptor positive ([place]) 04/05/2020   Cardiomegaly 03/13/2019   Essential hypertension 03/13/2019   OSA on CPAP 03/13/2019    ONSET DATE: 06/16/22 (Referral Date)  REFERRING DIAG: R26.9 (ICD-10-CM) - Gait disturbance  THERAPY DIAG:  No diagnosis found.  Rationale for Evaluation and Treatment Rehabilitation  SUBJECTIVE:                                                                                                                                                                                               SUBJECTIVE STATEMENT: *** Pt accompanied by: {accompnied:27141}  PERTINENT HISTORY: Anxiety, HTN, OSA on CPAP, Glaucoma, Cardiomegaly, left breast CA ERP s/p lumpectomy  PAIN:  Are you having pain? {OPRCPAIN:27236}  PRECAUTIONS: {Therapy precautions:24002}  WEIGHT BEARING RESTRICTIONS {Yes ***/No:24003}  FALLS: Has patient fallen in last 6 months? {fallsyesno:27318}  LIVING ENVIRONMENT: Lives with: {OPRC lives with:25569::"lives with their family"} Lives in: {Lives in:25570} Stairs: {opstairs:27293} Has following equipment at home: {Assistive devices:23999}  PLOF: {PLOF:24004}  PATIENT GOALS ***  OBJECTIVE:   DIAGNOSTIC FINDINGS: ***  COGNITION: Overall cognitive status: {cognition:24006}   SENSATION: {sensation:27233}  COORDINATION: ***  EDEMA:  {edema:24020}  MUSCLE TONE: {LE tone:25568}   MUSCLE LENGTH: Hamstrings: Right *** deg; Left *** deg Marcello Moores test: Right *** deg; Left *** deg  DTRs:  {DTR SITE:24025}  POSTURE: {posture:25561}  LOWER EXTREMITY ROM:     {AROM/PROM:27142}  Right Eval Left Eval  Hip flexion    Hip extension    Hip abduction    Hip adduction    Hip internal rotation    Hip external rotation    Knee flexion    Knee extension    Ankle dorsiflexion    Ankle plantarflexion    Ankle inversion    Ankle eversion     (Blank rows = not tested)  LOWER EXTREMITY MMT:    MMT Right Eval Left Eval  Hip flexion    Hip extension    Hip abduction    Hip adduction    Hip internal rotation    Hip external rotation    Knee flexion    Knee extension    Ankle dorsiflexion    Ankle plantarflexion    Ankle inversion    Ankle eversion    (Blank rows = not tested)  BED MOBILITY:  {Bed mobility:24027}  TRANSFERS: Assistive device utilized: {Assistive devices:23999}  Sit to stand: {Levels of assistance:24026} Stand to sit: {Levels of assistance:24026} Chair  to chair: {Levels of assistance:24026} Floor: {Levels of assistance:24026}  RAMP:  Level of Assistance: {Levels of assistance:24026} Assistive device utilized: {Assistive devices:23999} Ramp Comments: ***  CURB:  Level of Assistance: {Levels of assistance:24026} Assistive device utilized: {Assistive devices:23999} Curb Comments: ***  STAIRS:  Level of Assistance: {Levels of assistance:24026}  Stair Negotiation Technique: {Stair Technique:27161} with {Rail Assistance:27162}  Number of Stairs: ***   Height of Stairs: ***  Comments: ***  GAIT: Gait pattern: {gait characteristics:25376} Distance walked: *** Assistive device utilized: {Assistive devices:23999} Level of assistance: {Levels of assistance:24026} Comments: ***  FUNCTIONAL TESTs:  {Functional tests:24029}  PATIENT SURVEYS:  {rehab surveys:24030}  TODAY'S TREATMENT:  ***   PATIENT EDUCATION: Education details: *** Person educated: {Person educated:25204} Education method: {Education Method:25205} Education comprehension: {Education Comprehension:25206}   HOME EXERCISE PROGRAM: ***    GOALS: Goals reviewed with patient? {yes/no:20286}  SHORT TERM GOALS: Target date: {follow up:25551}  *** Baseline: Goal status: {GOALSTATUS:25110}  2.  *** Baseline:  Goal status: {GOALSTATUS:25110}  3.  *** Baseline:  Goal status: {GOALSTATUS:25110}  4.  *** Baseline:  Goal status: {GOALSTATUS:25110}  5.  *** Baseline:  Goal status: {GOALSTATUS:25110}  6.  *** Baseline:  Goal status: {GOALSTATUS:25110}  LONG TERM GOALS: Target date: {follow up:25551}  *** Baseline:  Goal status: {GOALSTATUS:25110}  2.  *** Baseline:  Goal status: {GOALSTATUS:25110}  3.  *** Baseline:  Goal status: {GOALSTATUS:25110}  4.  *** Baseline:  Goal status: {GOALSTATUS:25110}  5.  *** Baseline:  Goal status: {GOALSTATUS:25110}  6.  *** Baseline:  Goal status: {GOALSTATUS:25110}  ASSESSMENT:  CLINICAL  IMPRESSION: Patient is a 71 y.o. female referred to Neuro OPPT services for Gait Disturbance. Patient's PMH significant for the following: Anxiety, HTN, OSA on CPAP, Glaucoma, Cardiomegaly, left breast CA ERP s/p lumpectomy. Upon evaluation, patient presents with the following impairments: ***. Patient will benefit from skilled PT services to address impairments.     OBJECTIVE IMPAIRMENTS {opptimpairments:25111}.   ACTIVITY LIMITATIONS {activitylimitations:27494}  PARTICIPATION LIMITATIONS: {participationrestrictions:25113}  PERSONAL FACTORS {Personal factors:25162} are also affecting patient's functional outcome.   REHAB POTENTIAL: {rehabpotential:25112}  CLINICAL DECISION MAKING: {clinical decision making:25114}  EVALUATION COMPLEXITY: {Evaluation complexity:25115}  PLAN: PT FREQUENCY: {rehab  frequency:25116}  PT DURATION: {rehab duration:25117}  PLANNED INTERVENTIONS: {rehab planned interventions:25118::"Therapeutic exercises","Therapeutic activity","Neuromuscular re-education","Balance training","Gait training","Patient/Family education","Self Care","Joint mobilization"}  PLAN FOR NEXT SESSION: Kirtland Bouchard, PT, DPT 06/27/2022, 10:31 AM

## 2022-07-04 ENCOUNTER — Ambulatory Visit: Payer: Medicare Other

## 2022-07-04 ENCOUNTER — Ambulatory Visit: Payer: Medicare Other | Admitting: Internal Medicine

## 2022-07-06 ENCOUNTER — Other Ambulatory Visit: Payer: Self-pay | Admitting: Internal Medicine

## 2022-07-06 DIAGNOSIS — G4733 Obstructive sleep apnea (adult) (pediatric): Secondary | ICD-10-CM | POA: Diagnosis not present

## 2022-07-06 NOTE — Telephone Encounter (Signed)
Requested Prescriptions  Pending Prescriptions Disp Refills  . traZODone (DESYREL) 100 MG tablet [Pharmacy Med Name: Trazodone Hydrochloride '100mg'$  Tablet] 60 tablet 11    Sig: Take 1 to 2 tablets by mouth at bedtime for sleep     Psychiatry: Antidepressants - Serotonin Modulator Passed - 07/06/2022  2:13 AM      Passed - Valid encounter within last 6 months    Recent Outpatient Visits          2 weeks ago Encounter for [employer] annual wellness exam   [place] Ladell Pier, MD   5 months ago Encounter to establish care   [place], MD      Future Appointments            In 4 weeks Ladell Pier, MD [place]   In 7 months [place], [place], [facility]           . escitalopram (LEXAPRO) 10 MG tablet [Pharmacy Med Name: Escitalopram '10mg'$  Tablet] 30 tablet 11    Sig: Take 1 tablet by mouth every day     Psychiatry:  Antidepressants - SSRI Passed - 07/06/2022  2:13 AM      Passed - Valid encounter within last 6 months    Recent Outpatient Visits          2 weeks ago Encounter for [employer] annual wellness exam   [place] Ladell Pier, MD   5 months ago Encounter to establish care   [place], MD      Future Appointments            In 4 weeks Ladell Pier, MD [place]   In 7 months [place], [place], [place] [facility]

## 2022-07-07 ENCOUNTER — Ambulatory Visit
Admission: RE | Admit: 2022-07-07 | Discharge: 2022-07-07 | Disposition: A | Discharge status: Home / Self Care | Payer: Medicare Other | Source: Ambulatory Visit | Attending: Internal Medicine | Admitting: Internal Medicine

## 2022-07-07 DIAGNOSIS — Z853 Personal history of malignant neoplasm of breast: Secondary | ICD-10-CM | POA: Diagnosis not present

## 2022-07-07 DIAGNOSIS — R410 Disorientation, unspecified: Secondary | ICD-10-CM | POA: Diagnosis not present

## 2022-07-07 DIAGNOSIS — R270 Ataxia, unspecified: Secondary | ICD-10-CM | POA: Diagnosis not present

## 2022-07-07 DIAGNOSIS — G319 Degenerative disease of nervous system, unspecified: Secondary | ICD-10-CM | POA: Diagnosis not present

## 2022-07-07 DIAGNOSIS — R269 Unspecified abnormalities of gait and mobility: Secondary | ICD-10-CM

## 2022-07-09 ENCOUNTER — Telehealth: Payer: Self-pay | Admitting: Internal Medicine

## 2022-07-09 ENCOUNTER — Other Ambulatory Visit: Payer: Self-pay | Admitting: Internal Medicine

## 2022-07-09 DIAGNOSIS — R269 Unspecified abnormalities of gait and mobility: Secondary | ICD-10-CM

## 2022-07-09 DIAGNOSIS — Z9181 History of falling: Secondary | ICD-10-CM

## 2022-07-09 NOTE — Telephone Encounter (Signed)
Phone call placed to patient today to go over the results of the MRI of the head.  I received a voicemail with the female's voice on it.  I left a message letting her know who I am and that I was calling to go over the results of her CAT scan and that we will try to reach her again later.  Message sent to my CMA to try to reach her this coming week.

## 2022-07-10 ENCOUNTER — Ambulatory Visit: Payer: Self-pay | Admitting: *Deleted

## 2022-07-10 ENCOUNTER — Other Ambulatory Visit: Payer: Medicare Other

## 2022-07-10 NOTE — Telephone Encounter (Signed)
Noted  

## 2022-07-10 NOTE — Telephone Encounter (Signed)
Pt called in and was given the CT scan of her head result message from Dr. Karle Plumber.  She let me know she is having back surgery by an orthopedic dr not a neurosurgeon.   I let her know, when she asked, if she should tell him the results.   I let her know that was a good idea for his knowledge.  She was agreeable to seeing a neurologist.   Reason for Disposition  [1] Follow-up call to recent contact AND [2] information only call, no triage required  Answer Assessment - Initial Assessment Questions 1. REASON FOR CALL or QUESTION: "What is your reason for calling today?" or "How can I best help you?" or "What question do you have that I can help answer?"     Read the CT scan of head results to her after she called in returning the call.  Protocols used: Information Only Call - No Triage-A-AH

## 2022-07-11 ENCOUNTER — Encounter: Payer: Medicare Other | Admitting: Vascular Surgery

## 2022-07-12 ENCOUNTER — Ambulatory Visit: Payer: Medicare Other

## 2022-07-12 DIAGNOSIS — Z01810 Encounter for preprocedural cardiovascular examination: Secondary | ICD-10-CM

## 2022-07-18 ENCOUNTER — Ambulatory Visit: Payer: Medicare Other | Admitting: Internal Medicine

## 2022-07-18 ENCOUNTER — Encounter: Payer: Self-pay | Admitting: Internal Medicine

## 2022-07-18 VITALS — BP 135/86 | HR 60 | Temp 98.0°F | Resp 16 | Ht 60.0 in | Wt 208.0 lb

## 2022-07-18 DIAGNOSIS — Z01818 Encounter for other preprocedural examination: Secondary | ICD-10-CM

## 2022-07-18 NOTE — Progress Notes (Signed)
Primary Physician/Referring:  Ladell Pier, MD  Patient ID: Deanna Potter, female    DOB: 03-12-1951, 71 y.o.   MRN: [id number]  Chief Complaint  Patient presents with   Hypertension   Medical Clearance    Back surgery   HPI:    Deanna Potter  is a 71 y.o. Caucasian female patient with hypertension, chronic palpitations, very mild hyperlipidemia, obstructive sleep apnea not using CPAP, moved from [place] in 2019.   She is here for pre-op evaluation prior to lumbar fusion. Patient's blood pressure is very well controlled. She does not have any palpitations of dyspnea on exertion. She does not smoke. Patient is compliant with her medications. Denies chest pain, dyspnea, PND, orthopnea, palpitations.   Past Medical History:  Diagnosis Date   Anxiety    Cancer ([place]) 03/21/2020   Breast Cancer   Cardiomegaly 03/13/2019   Glaucoma    HTN (hypertension)    Lumbar spine pain    OSA on CPAP    Wears hearing aid    Past Surgical History:  Procedure Laterality Date   BREAST LUMPECTOMY WITH RADIOACTIVE SEED LOCALIZATION Left 07/19/2020   Procedure: LEFT BREAST LUMPECTOMY WITH RADIOACTIVE SEED LOCALIZATION;  Surgeon: Jovita Kussmaul, MD;  Location: [facility];  Service: General;  Laterality: Left;   BREAST REDUCTION SURGERY Bilateral 07/19/2020   Procedure: BILATERAL MAMMARY REDUCTION  (BREAST) ONCO PLASTIC FOR LEFT BREAST CANCER;  Surgeon: Wallace Going, DO;  Location: [place];  Service: Plastics;  Laterality: Bilateral;   EYE SURGERY Bilateral    PARTIAL HYSTERECTOMY     Social History   Tobacco Use   Smoking status: Never   Smokeless tobacco: Never  Substance Use Topics   Alcohol use: Not Currently  Marital status: Widowed    ROS  Review of Systems  Constitutional: Negative.  HENT:  Hearing loss: moderate, uses hearing aids.   Eyes:  Visual disturbance: glaucoma.  Cardiovascular:  Negative for chest pain, dyspnea on exertion, leg swelling, palpitations (no recurrence  since last visit) and syncope.  Respiratory:  Snoring: Using CPAP .   Musculoskeletal:  Positive for back pain (chronic).  All other systems reviewed and are negative.   Objective      07/18/2022   11:37 AM 06/16/2022    3:37 PM 06/15/2022    2:34 PM  Vitals with BMI  Height 5' 0"  5' 0"  5' 1"   Weight 208 lbs 209 lbs 3 oz 208 lbs  BMI 40.62 55.21 74.71  Systolic 595 396 728  Diastolic 86 80 74  Pulse 60 63 75      Physical Exam Vitals reviewed.  Constitutional:      Appearance: Normal appearance.     Comments: Short stature, moderately obese in no acute distress.  HENT:     Head: Normocephalic and atraumatic.  Neck:     Thyroid: No thyromegaly.     Vascular: No JVD.  Cardiovascular:     Rate and Rhythm: Normal rate and regular rhythm.     Pulses: Normal pulses and intact distal pulses.     Heart sounds: Normal heart sounds. No murmur heard.    No gallop.     Comments: No JVD. Pulmonary:     Effort: Pulmonary effort is normal.     Breath sounds: Normal breath sounds.  Abdominal:     General: Bowel sounds are normal.     Palpations: Abdomen is soft.  Musculoskeletal:     Cervical back: Neck supple.  Right lower leg: No edema.     Left lower leg: No edema.  Skin:    General: Skin is warm and dry.  Neurological:     Mental Status: She is alert.   Laboratory examination:   Recent Labs    12/30/21 1525 02/09/22 1454  NA 136 139  K 3.6 3.8  CL 105 104  CO2 23 26  GLUCOSE 96 110*  BUN 14 12  CREATININE 1.00 0.96  CALCIUM 9.9 10.3  GFRNONAA >60 >60    CrCl cannot be calculated (Patient's most recent lab result is older than the maximum 21 days allowed.).     Latest Ref Rng & Units 02/09/2022    2:54 PM 01/10/2022   10:15 AM 12/30/2021    3:25 PM  CMP  Glucose 70 - 99 mg/dL 110   96   BUN 8 - 23 mg/dL 12   14   Creatinine 0.44 - 1.00 mg/dL 0.96   1.00   Sodium 135 - 145 mmol/L 139   136   Potassium 3.5 - 5.1 mmol/L 3.8   3.6   Chloride 98 - 111  mmol/L 104   105   CO2 22 - 32 mmol/L 26   23   Calcium 8.9 - 10.3 mg/dL 10.3   9.9   Total Protein 6.5 - 8.1 g/dL 8.0  7.0    Total Bilirubin 0.3 - 1.2 mg/dL 0.5  0.2    Alkaline Phos 38 - 126 U/L 74  95    AST 15 - 41 U/L 28  17    ALT 0 - 44 U/L 26  14        Latest Ref Rng & Units 02/09/2022    2:54 PM 12/30/2021    3:25 PM 06/06/2021   11:33 PM  CBC  WBC 4.0 - 10.5 K/uL 10.4  9.8  11.8   Hemoglobin 12.0 - 15.0 g/dL 13.9  13.4  13.5   Hematocrit 36.0 - 46.0 % 42.2  40.3  41.5   Platelets 150 - 400 K/uL 330  334  336    Lipid Panel     Component Value Date/Time   CHOL 143 01/10/2022 1015   TRIG 101 01/10/2022 1015   HDL 43 01/10/2022 1015   CHOLHDL 3.3 01/10/2022 1015   LDLCALC 81 01/10/2022 1015   HEMOGLOBIN A1C Lab Results  Component Value Date   HGBA1C 6.6 (H) 01/10/2022   TSH Recent Labs    02/09/22 1518  TSH 1.112      External Labs: 02/03/2019:  HB 13.5/HCT 40.9, normal indicis, platelets 184.  BUN 12, creatinine 0.92, eGFR 65 mL.  Potassium 4.1.  CMP normal.  Total cholesterol 179, triglycerides 121, HDL 60, LDL 107.  Non-HDL cholesterol 129.  Allergies  No Known Allergies   Medications Prior to Visit:   Outpatient Medications Prior to Visit  Medication Sig Dispense Refill   amLODipine (NORVASC) 5 MG tablet Take 5 mg by mouth daily.     escitalopram (LEXAPRO) 10 MG tablet Take 1 tablet by mouth every day 30 tablet 0   gabapentin (NEURONTIN) 100 MG capsule Take 100 mg by mouth 3 (three) times daily.     latanoprost (XALATAN) 0.005 % ophthalmic solution Place 1 drop into both eyes 2 (two) times daily.     metoprolol succinate (TOPROL-XL) 50 MG 24 hr tablet Take 1 tablet by mouth every day with a meal 30 tablet 11   rosuvastatin (CRESTOR) 5 MG tablet Take 1 tablet  by mouth every day 30 tablet 11   Sodium Sulfate-Mag Sulfate-KCl (SUTAB) [phone] MG TABS See admin instructions.     solifenacin (VESICARE) 5 MG tablet Take 1 tablet (5 mg total) by mouth  daily. 30 tablet 1   traZODone (DESYREL) 100 MG tablet Take 1 to 2 tablets by mouth at bedtime for sleep 60 tablet 0   letrozole (FEMARA) 2.5 MG tablet Take 1 tablet (2.5 mg total) by mouth daily. (Patient not taking: Reported on 06/16/2022) 30 tablet 0   No facility-administered medications prior to visit.   Final Medications at End of Visit    Current Meds  Medication Sig   amLODipine (NORVASC) 5 MG tablet Take 5 mg by mouth daily.   escitalopram (LEXAPRO) 10 MG tablet Take 1 tablet by mouth every day   gabapentin (NEURONTIN) 100 MG capsule Take 100 mg by mouth 3 (three) times daily.   latanoprost (XALATAN) 0.005 % ophthalmic solution Place 1 drop into both eyes 2 (two) times daily.   metoprolol succinate (TOPROL-XL) 50 MG 24 hr tablet Take 1 tablet by mouth every day with a meal   rosuvastatin (CRESTOR) 5 MG tablet Take 1 tablet by mouth every day   Sodium Sulfate-Mag Sulfate-KCl (SUTAB) [phone] MG TABS See admin instructions.   solifenacin (VESICARE) 5 MG tablet Take 1 tablet (5 mg total) by mouth daily.   traZODone (DESYREL) 100 MG tablet Take 1 to 2 tablets by mouth at bedtime for sleep   Radiology:  No results found.  Cardiac Studies:   Echocardiogram 08/05/2020: Normal LV systolic function with visual EF 50-55%. Left ventricle cavity is normal in size. Severe left ventricular hypertrophy. Normal global wall motion. Normal diastolic filling pattern, normal LAP. Calculated EF 55%. Mild (Grade I) aortic regurgitation. Mild tricuspid regurgitation. No evidence of pulmonary hypertension. No prior study for comparison.  EKG  06/16/2022: Sinus rhythm at a rate of 59 bpm with first-degree AV block.  Left axis, left anterior fascicular block.  LVH.  Compared to EKG 02/23/2022, unchanged.  EKG 10/28/2020: Normal sinus rhythm with rate of 77 bpm, left axis deviation, left anterior fascicular block.  Marked LVH by voltage and IVCD.  Abnormal EKG.   EKG 10/28/2019: Sinus rhythm with  borderline first-degree AV block at the rate of 69 bpm, left atrial enlargement, marked high-voltage and IVCD, LVH with repolarization abnormality with ST-T wave changes in the lateral leads.  Normal QT interval.    Assessment     ICD-10-CM   1. Pre-op evaluation  Z01.818 EKG 12-Lead      No orders of the defined types were placed in this encounter.  Medications Discontinued During This Encounter  Medication Reason   letrozole ([place]) 2.5 MG tablet       Recommendations:   Deanna Potter  is a 71 y.o. Caucasian female patient with hypertension, chronic palpitations, very mild hyperlipidemia, obstructive sleep apnea now using CPAP, moved from [place] in 2019.    Pre-op evaluation for lumbar fusion (low-intermediate risk surgery) Continue current cardiac medications. Encourage low-sodium diet, less than 2000 mg daily. EKG unchanged from prior. Patient reaches at least 6 METS per DASI, ok to proceed with surgery. Follow-up in 6 months or sooner if needed.    Deanna Flock, DO, [facility] 07/18/2022, 12:28 PM Office: [phone]

## 2022-07-24 ENCOUNTER — Ambulatory Visit: Payer: Medicare Other | Attending: Internal Medicine | Admitting: Internal Medicine

## 2022-07-24 ENCOUNTER — Encounter: Payer: Self-pay | Admitting: Internal Medicine

## 2022-07-24 DIAGNOSIS — E1169 Type 2 diabetes mellitus with other specified complication: Secondary | ICD-10-CM

## 2022-07-24 DIAGNOSIS — Z01818 Encounter for other preprocedural examination: Secondary | ICD-10-CM | POA: Diagnosis not present

## 2022-07-24 DIAGNOSIS — R269 Unspecified abnormalities of gait and mobility: Secondary | ICD-10-CM | POA: Diagnosis not present

## 2022-07-24 DIAGNOSIS — E1159 Type 2 diabetes mellitus with other circulatory complications: Secondary | ICD-10-CM

## 2022-07-24 DIAGNOSIS — Z9989 Dependence on other enabling machines and devices: Secondary | ICD-10-CM | POA: Diagnosis not present

## 2022-07-24 DIAGNOSIS — I152 Hypertension secondary to endocrine disorders: Secondary | ICD-10-CM

## 2022-07-24 DIAGNOSIS — G4733 Obstructive sleep apnea (adult) (pediatric): Secondary | ICD-10-CM | POA: Diagnosis not present

## 2022-07-24 LAB — POCT GLYCOSYLATED HEMOGLOBIN (HGB A1C): HbA1c, POC (controlled diabetic range): 6.1 % (ref 0.0–7.0)

## 2022-07-24 LAB — GLUCOSE, POCT (MANUAL RESULT ENTRY): POC Glucose: 99 mg/dl (ref 70–99)

## 2022-07-24 MED ORDER — AMLODIPINE BESYLATE 10 MG PO TABS: 10.0000 mg | ORAL_TABLET | Freq: Every day | ORAL | 4 refills | Status: DC

## 2022-07-24 NOTE — Progress Notes (Signed)
Patient ID: Deanna Potter, female    DOB: Aug 06, 1951  MRN: [id number]  CC: pre-op eval  Subjective: Deanna Potter is a 71 y.o. female who presents for preoperative eval Her concerns today include:  Patient with history of HTN (LV hypertrophy w/out CHF followed by cardiology Dr. Einar Gip), HL, OSA on CPAP, left breast CA ERP s/p lumpectomy and XRT (dx 06/2020),  hearing impairment wears hearing aids, anx/dep, DM/morbid obesity, + cologuard.   Pt will be having lumbar surgery for lumbar radiculopathy with Dr. Rolena Infante No problems waking from anesthesia in the past Seen by her cardiologist 07/18/22 and was cleared for surgery from a cardiovascular standpoint.  Had low risks nuclear stress test. No CP/SOB.  Slight HA at times Reports compliance with Norvasc 5 mg and ToprolXL 50 mg daily.  Has BP device but not convince it is accurate.  Limits salt in foods somewhat. Reports compliance with CPAP for her OSA In regards to DM, ran out of stripes 1 mth ago.  Would like prescription to be sent to her pharmacy.  She does not recall the type of device that she has. A1C today is 6.1  No further falls since last visit.  Does ambulate with a cane sometimes but doess not have today with her.  CT of the head showed chronic small vessel ischemia or prominent perivascular spaces.  Also showed mild generalized parenchymal atrophy.  She was referred for some physical therapy but she decided to hold off on that.  States her neurosurgeon had referred her to physical therapy before and it caused increased pain for her.  She plans to do it after her surgery.  Also referred to neurology.  [place] neurology has tried contacting her twice.  Patient states she was not aware and that sometimes she does not check voicemail because she gets so many calls. Patient Active Problem List   Diagnosis Date Noted   Type 2 diabetes mellitus with morbid obesity ([place]) 01/10/2022   Hyperlipidemia 01/10/2022   GAD (generalized anxiety  disorder) 01/10/2022   Uses hearing aid 01/10/2022   Positive colorectal cancer screening using Cologuard test 01/10/2022   Hx of radiation therapy 11/02/2020   S/P bilateral breast reduction 07/27/2020   Malignant neoplasm of upper-outer quadrant of left breast in female, estrogen receptor positive ([place]) 04/05/2020   Cardiomegaly 03/13/2019   Essential hypertension 03/13/2019   OSA on CPAP 03/13/2019     Current Outpatient Medications on File Prior to Visit  Medication Sig Dispense Refill   amLODipine (NORVASC) 5 MG tablet Take 5 mg by mouth daily.     escitalopram (LEXAPRO) 10 MG tablet Take 1 tablet by mouth every day 30 tablet 0   gabapentin (NEURONTIN) 100 MG capsule Take 100 mg by mouth 3 (three) times daily.     latanoprost (XALATAN) 0.005 % ophthalmic solution Place 1 drop into both eyes 2 (two) times daily.     metoprolol succinate (TOPROL-XL) 50 MG 24 hr tablet Take 1 tablet by mouth every day with a meal 30 tablet 11   rosuvastatin (CRESTOR) 5 MG tablet Take 1 tablet by mouth every day 30 tablet 11   Sodium Sulfate-Mag Sulfate-KCl (SUTAB) [phone] MG TABS See admin instructions.     solifenacin (VESICARE) 5 MG tablet Take 1 tablet (5 mg total) by mouth daily. 30 tablet 1   traZODone (DESYREL) 100 MG tablet Take 1 to 2 tablets by mouth at bedtime for sleep 60 tablet 0   No current facility-administered medications on  file prior to visit.    No Known Allergies  Social History   Socioeconomic History   Marital status: Widowed    Spouse name: Not on file   Number of children: 2   Years of education: Not on file   Highest education level: Not on file  Occupational History   Not on file  Tobacco Use   Smoking status: Never   Smokeless tobacco: Never  Vaping Use   Vaping Use: Never used  Substance and Sexual Activity   Alcohol use: Not Currently   Drug use: Never   Sexual activity: Not on file  Other Topics Concern   Not on file  Social History Narrative    Not on file   Social Determinants of Health   Financial Resource Strain: Not on file  Food Insecurity: Not on file  Transportation Needs: Not on file  Physical Activity: Not on file  Stress: Not on file  Social Connections: Not on file  Intimate Partner Violence: Not on file    Family History  Problem Relation Age of Onset   Stomach cancer Brother     Past Surgical History:  Procedure Laterality Date   BREAST LUMPECTOMY WITH RADIOACTIVE SEED LOCALIZATION Left 07/19/2020   Procedure: LEFT BREAST LUMPECTOMY WITH RADIOACTIVE SEED LOCALIZATION;  Surgeon: Jovita Kussmaul, MD;  Location: [facility];  Service: General;  Laterality: Left;   BREAST REDUCTION SURGERY Bilateral 07/19/2020   Procedure: BILATERAL MAMMARY REDUCTION  (BREAST) ONCO PLASTIC FOR LEFT BREAST CANCER;  Surgeon: Wallace Going, DO;  Location: [place];  Service: Plastics;  Laterality: Bilateral;   EYE SURGERY Bilateral    PARTIAL HYSTERECTOMY      ROS: Review of Systems Negative except as stated above  PHYSICAL EXAM: BP 138/83   Pulse 61   Temp 98.7 F (37.1 C) (Oral)   Ht 5' (1.524 m)   Wt 197 lb (89.4 kg)   SpO2 97%   BMI 38.47 kg/m   Physical Exam BP 145/90 General appearance - alert, well appearing, older African-American female sitting in chair and in no distress Mental status - normal mood, behavior, speech, dress, motor activity, and thought processes Eyes -pink conjunctiva Nose - normal and patent, no erythema, discharge or polyps Mouth - mucous membranes moist, pharynx normal without lesions Neck - supple, no significant adenopathy Chest - clear to auscultation, no wheezes, rales or rhonchi, symmetric air entry Heart - normal rate, regular rhythm, normal S1, S2, no murmurs, rubs, clicks or gallops Extremities -no lower extremity edema.  Good peripheral pulses.      Latest Ref Rng & Units 02/09/2022    2:54 PM 01/10/2022   10:15 AM 12/30/2021    3:25 PM  CMP  Glucose 70 - 99 mg/dL 110   96    BUN 8 - 23 mg/dL 12   14   Creatinine 0.44 - 1.00 mg/dL 0.96   1.00   Sodium 135 - 145 mmol/L 139   136   Potassium 3.5 - 5.1 mmol/L 3.8   3.6   Chloride 98 - 111 mmol/L 104   105   CO2 22 - 32 mmol/L 26   23   Calcium 8.9 - 10.3 mg/dL 10.3   9.9   Total Protein 6.5 - 8.1 g/dL 8.0  7.0    Total Bilirubin 0.3 - 1.2 mg/dL 0.5  0.2    Alkaline Phos 38 - 126 U/L 74  95    AST 15 - 41 U/L 28  17  ALT 0 - 44 U/L 26  14     Lipid Panel     Component Value Date/Time   CHOL 143 01/10/2022 1015   TRIG 101 01/10/2022 1015   HDL 43 01/10/2022 1015   CHOLHDL 3.3 01/10/2022 1015   LDLCALC 81 01/10/2022 1015    CBC    Component Value Date/Time   WBC 10.4 02/09/2022 1454   RBC 5.42 (H) 02/09/2022 1454   HGB 13.9 02/09/2022 1454   HGB 13.3 04/07/2020 0814   HCT 42.2 02/09/2022 1454   PLT 330 02/09/2022 1454   PLT 379 04/07/2020 0814   MCV 77.9 (L) 02/09/2022 1454   MCH 25.6 (L) 02/09/2022 1454   MCHC 32.9 02/09/2022 1454   RDW 14.8 02/09/2022 1454   LYMPHSABS 2.3 02/09/2022 1454   MONOABS 0.7 02/09/2022 1454   EOSABS 0.3 02/09/2022 1454   BASOSABS 0.1 02/09/2022 1454    ASSESSMENT AND PLAN: 1. Preoperative evaluation to rule out surgical contraindication -Patient maximized for surgery.  My note will be sent to Dr. Rolena Infante.  2. Hypertension associated with diabetes ([place]) Not at goal.  I recommend increasing amlodipine to 10 mg daily. - amLODipine (NORVASC) 10 MG tablet; Take 1 tablet (10 mg total) by mouth daily.  Dispense: 30 tablet; Refill: 4  3. Type 2 diabetes mellitus with morbid obesity ([place]) At goal.  She is diet controlled.  Advised to call me back with the name of the type of blood glucose monitor that she has so that I can send prescription to her pharmacy for the strips. - POCT glucose (manual entry) - POCT glycosylated hemoglobin (Hb A1C)  4. OSA on CPAP Encouraged her to continue using her CPAP machine at nights.  5. Gait disturbance Given the phone number  for [facility] neurology so that she can call and schedule her appointment.     Patient was given the opportunity to ask questions.  Patient verbalized understanding of the plan and was able to repeat key elements of the plan.   This documentation was completed using Radio producer.  Any transcriptional errors are unintentional.  Orders Placed This Encounter  Procedures   POCT glucose (manual entry)   POCT glycosylated hemoglobin (Hb A1C)     Requested Prescriptions    No prescriptions requested or ordered in this encounter    No follow-ups on file.  Karle Plumber, MD, FACP

## 2022-07-24 NOTE — Patient Instructions (Signed)
Please call [facility] to schedule your appointment with the neurologist to be evaluated further for the gait problem. Ph# [phone]

## 2022-07-26 ENCOUNTER — Telehealth: Payer: Self-pay | Admitting: Internal Medicine

## 2022-07-26 MED ORDER — ACCU-CHEK SOFTCLIX LANCETS MISC: 2 refills | Status: AC

## 2022-07-26 MED ORDER — ACCU-CHEK GUIDE VI STRP: ORAL_STRIP | 2 refills | Status: AC

## 2022-07-26 MED ORDER — ACCU-CHEK GUIDE W/DEVICE KIT: PACK | 0 refills | Status: AC

## 2022-07-26 NOTE — Telephone Encounter (Signed)
Copied from [place] [id number]. Topic: General - Other >> Jul 26, 2022  3:45 PM Everette C wrote: Reason for CRM: Medication Refill - Medication: glucose monitoring device   Has the patient contacted their pharmacy? No. (Agent: If no, request that the patient contact the pharmacy for the refill. If patient does not wish to contact the pharmacy document the reason why and proceed with request.) (Agent: If yes, when and what did the pharmacy advise?)  Preferred Pharmacy (with phone number or street name): [facility] ([address]), [place] - [address] [place] [address] Phone: [phone] Fax: [phone] Hours: Not open 24 hours  Has the patient been seen for an appointment in the last year OR does the patient have an upcoming appointment? Yes.    Agent: Please be advised that RX refills may take up to 3 business days. We ask that you follow-up with your pharmacy.

## 2022-07-26 NOTE — Telephone Encounter (Signed)
Copied from [place] [phone]. Topic: General - Other >> Jul 26, 2022  3:45 PM Deanna Potter wrote: Reason for CRM: The patient has called for an update on their previously submitted surgical clearance paperwork   Please contact the patient further when possible

## 2022-07-26 NOTE — Telephone Encounter (Signed)
Rx sent 

## 2022-07-26 NOTE — Telephone Encounter (Signed)
Pt was informed that forms were received, pcp reviewed and signed surgical clearance. Forms have been faxed. Pt expressed understanding.--DD,RMA

## 2022-07-28 ENCOUNTER — Ambulatory Visit (HOSPITAL_COMMUNITY): Payer: Self-pay | Admitting: Orthopedic Surgery

## 2022-08-01 ENCOUNTER — Encounter: Payer: Self-pay | Admitting: Vascular Surgery

## 2022-08-01 ENCOUNTER — Ambulatory Visit (INDEPENDENT_AMBULATORY_CARE_PROVIDER_SITE_OTHER): Payer: Medicare Other | Admitting: Vascular Surgery

## 2022-08-01 DIAGNOSIS — M5442 Lumbago with sciatica, left side: Secondary | ICD-10-CM | POA: Diagnosis not present

## 2022-08-01 DIAGNOSIS — G8929 Other chronic pain: Secondary | ICD-10-CM | POA: Diagnosis not present

## 2022-08-01 DIAGNOSIS — M5441 Lumbago with sciatica, right side: Secondary | ICD-10-CM | POA: Diagnosis not present

## 2022-08-01 NOTE — Progress Notes (Signed)
Patient name: Deanna Potter MRN: [id number] DOB: 1951-03-24 Sex: female  REASON FOR CONSULT: Evaluate for L3-L5 OLIF  HPI: Deanna Potter is a 71 y.o. female, with history of breast cancer, hypertension, chronic back pain that presents for evaluation of abdominal exposure for L3-L5 OLIF.  Patient has been followed by Dr. Rolena Infante.  She describes years of back pain.  In review of his notes, Dr. 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  He has recommended an L3-L4 and L4-L5 OLIF to stabilize the spondylolisthesis and asked vascular surgery to assist with abdominal exposure.  Previous abdominal surgical history includes tubal ligation.  Past Medical History:  Diagnosis Date   Anxiety    Cancer ([place]) 03/21/2020   Breast Cancer   Cardiomegaly 03/13/2019   Glaucoma    HTN (hypertension)    Lumbar spine pain    OSA on CPAP    Wears hearing aid     Past Surgical History:  Procedure Laterality Date   BREAST LUMPECTOMY WITH RADIOACTIVE SEED LOCALIZATION Left 07/19/2020   Procedure: LEFT BREAST LUMPECTOMY WITH RADIOACTIVE SEED LOCALIZATION;  Surgeon: Jovita Kussmaul, MD;  Location: [place];  Service: General;  Laterality: Left;   BREAST REDUCTION SURGERY Bilateral 07/19/2020   Procedure: BILATERAL MAMMARY REDUCTION  (BREAST) ONCO PLASTIC FOR LEFT BREAST CANCER;  Surgeon: Wallace Going, DO;  Location: [facility];  Service: Plastics;  Laterality: Bilateral;   EYE SURGERY Bilateral    PARTIAL HYSTERECTOMY      Family History  Problem Relation Age of Onset   Stomach cancer Brother     SOCIAL HISTORY: Social History   Socioeconomic History   Marital status: Widowed    Spouse name: Not on file   Number of children: 2   Years of education: Not on file   Highest education level: Not on file  Occupational History   Not on file  Tobacco Use   Smoking status: Never   Smokeless tobacco: Never  Vaping Use    Vaping Use: Never used  Substance and Sexual Activity   Alcohol use: Not Currently   Drug use: Never   Sexual activity: Not on file  Other Topics Concern   Not on file  Social History Narrative   Not on file   Social Determinants of Health   Financial Resource Strain: Not on file  Food Insecurity: Not on file  Transportation Needs: Not on file  Physical Activity: Not on file  Stress: Not on file  Social Connections: Not on file  Intimate Partner Violence: Not on file    No Known Allergies  Current Outpatient Medications  Medication Sig Dispense Refill   Accu-Chek Softclix Lancets lancets Use to check blood sugar once daily. 100 each 2   amLODipine (NORVASC) 10 MG tablet Take 1 tablet (10 mg total) by mouth daily. 30 tablet 4   Blood Glucose Monitoring Suppl (ACCU-CHEK GUIDE) w/Device KIT Use to check blood sugar once daily. 1 kit 0   escitalopram (LEXAPRO) 10 MG tablet Take 1 tablet by mouth every day 30 tablet 0   gabapentin (NEURONTIN) 100 MG capsule Take 100 mg by mouth 3 (three) times daily.     glucose blood (ACCU-CHEK GUIDE) test strip Use to check blood sugar once daily. 100 each 2   latanoprost (XALATAN) 0.005 % ophthalmic solution Place 1 drop into both eyes 2 (two) times daily.     metoprolol succinate (TOPROL-XL) 50  MG 24 hr tablet Take 1 tablet by mouth every day with a meal 30 tablet 11   rosuvastatin (CRESTOR) 5 MG tablet Take 1 tablet by mouth every day 30 tablet 11   Sodium Sulfate-Mag Sulfate-KCl (SUTAB) [phone] MG TABS See admin instructions.     solifenacin (VESICARE) 5 MG tablet Take 1 tablet (5 mg total) by mouth daily. 30 tablet 1   traZODone (DESYREL) 100 MG tablet Take 1 to 2 tablets by mouth at bedtime for sleep 60 tablet 0   No current facility-administered medications for this visit.    REVIEW OF SYSTEMS:  [X] denotes positive finding, [ ] denotes negative finding Cardiac  Comments:  Chest pain or chest pressure:    Shortness of breath  upon exertion:    Short of breath when lying flat:    Irregular heart rhythm:        Vascular    Pain in calf, thigh, or hip brought on by ambulation:    Pain in feet at night that wakes you up from your sleep:     Blood clot in your veins:    Leg swelling:         Pulmonary    Oxygen at home:    Productive cough:     Wheezing:         Neurologic    Sudden weakness in arms or legs:     Sudden numbness in arms or legs:     Sudden onset of difficulty speaking or slurred speech:    Temporary loss of vision in one eye:     Problems with dizziness:         Gastrointestinal    Blood in stool:     Vomited blood:         Genitourinary    Burning when urinating:     Blood in urine:        Psychiatric    Major depression:         Hematologic    Bleeding problems:    Problems with blood clotting too easily:        Skin    Rashes or ulcers:        Constitutional    Fever or chills:      PHYSICAL EXAM: Vitals:   08/01/22 1443  BP: 139/66  Pulse: (!) 56  Resp: 14  Temp: (!) 97.4 F (36.3 C)  TempSrc: Temporal  SpO2: 95%  Weight: 203 lb (92.1 kg)  Height: 5' (1.524 m)    GENERAL: The patient is a well-nourished female, in no acute distress. The vital signs are documented above. CARDIAC: There is a regular rate and rhythm.  VASCULAR:  Palpable femoral pulses bilaterally Palpable DP pulses bilaterally PULMONARY: No respiratory distress. ABDOMEN: Soft and non-tender. MUSCULOSKELETAL: There are no major deformities or cyanosis. NEUROLOGIC: No focal weakness or paresthesias are detected. SKIN: There are no ulcers or rashes noted. PSYCHIATRIC: The patient has a normal affect.  DATA:  MRI reviewed     Assessment/Plan:  71 year old female with chronic lower back pain that presents for evaluation of oblique spine exposure at the L3-L4 and L4-L5 disc space for OLIF.  Discussed after review of her MRI, I think she would be a good candidate for oblique approach.   Discussed her being in the right lateral position and then making an incision over her left oblique muscles at the L3-L4 and L4-L5 disc level.  Discussed entering the retroperitoneum and mobilized peritoneum and left ureter  to the midline and then mobilizing left psoas away from the disc space as well as possibly mobilizing the vessels to get to the disc space.  Discussed risk of injury to the above structures.  All questions answered.  Look forward to assisting Dr. Rolena Infante.   Marty Heck, MD [facility] Office: [phone]

## 2022-08-03 ENCOUNTER — Ambulatory Visit: Payer: Medicare Other | Admitting: Internal Medicine

## 2022-08-08 ENCOUNTER — Telehealth: Payer: Self-pay | Admitting: Emergency Medicine

## 2022-08-08 ENCOUNTER — Other Ambulatory Visit: Payer: Self-pay

## 2022-08-08 NOTE — Telephone Encounter (Signed)
Copied from [place] [id number]. Topic: General - Inquiry >> Aug 08, 2022  3:34 PM Marcellus Scott wrote: Reason for CRM: Pt stated she needs a bed. Is getting ready to have surgery pt is requesting for Dr.Johnson to order her a bed. Currently sleeping on a sofa.  Pt is having surgery on the 08/22/2022.  Please advise.

## 2022-08-09 NOTE — Pre-Procedure Instructions (Signed)
Surgical Instructions    Your procedure is scheduled on Monday September 25.  Report to [facility] Main Entrance "A" at 5:30 A.M., then check in with the Admitting office.  Call this number if you have problems the morning of surgery:  [phone]   If you have any questions prior to your surgery date call [phone]: Open Monday-Friday 8am-4pm    Remember:  Do not eat or drink after midnight the night before your surgery     Take these medicines the morning of surgery with A SIP OF WATER:  amLODipine (NORVASC) escitalopram (LEXAPRO) metoprolol succinate (TOPROL-XL)  diphenhydrAMINE (BENADRYL)  if needed  As of today, STOP taking any Aspirin (unless otherwise instructed by your surgeon) Aleve, Naproxen, Ibuprofen, Motrin, Advil, Goody's, BC's, all herbal medications, fish oil, and all vitamins.           [facility] [place] is not responsible for any belongings or valuables.    Do NOT Smoke (Tobacco/Vaping)  24 hours prior to your procedure  If you use a CPAP at night, you may bring your mask for your overnight stay.   Contacts, glasses, hearing aids, dentures or partials may not be worn into surgery, please bring cases for these belongings   For patients admitted to the hospital, discharge time will be determined by your treatment team.   Patients discharged the day of surgery will not be allowed to drive home, and someone needs to stay with them for 24 hours.   SURGICAL WAITING ROOM VISITATION Patients having surgery or a procedure may have no more than 2 support people in the waiting area - these visitors may rotate.   Children under the age of 47 must have an adult with them who is not the patient. If the patient needs to stay at the hospital during part of their recovery, the visitor guidelines for inpatient rooms apply. Pre-op nurse will coordinate an appropriate time for 1 support person to accompany patient in pre-op.  This support person may not rotate.   Please refer  to the [facility] website for the visitor guidelines for Inpatients (after your surgery is over and you are in a regular room).    Special instructions:    Oral Hygiene is also important to reduce your risk of infection.  Remember - BRUSH YOUR TEETH THE MORNING OF SURGERY WITH YOUR REGULAR TOOTHPASTE   [facility]- Preparing For Surgery  Before surgery, you can play an important role. Because skin is not sterile, your skin needs to be as free of germs as possible. You can reduce the number of germs on your skin by washing with CHG (chlorahexidine gluconate) Soap before surgery.  CHG is an antiseptic cleaner which kills germs and bonds with the skin to continue killing germs even after washing.     Please do not use if you have an allergy to CHG or antibacterial soaps. If your skin becomes reddened/irritated stop using the CHG.  Do not shave (including legs and underarms) for at least 48 hours prior to first CHG shower. It is OK to shave your face.  Please follow these instructions carefully.     Shower the NIGHT BEFORE SURGERY and the MORNING OF SURGERY with CHG Soap.   If you chose to wash your hair, wash your hair first as usual with your normal shampoo. After you shampoo, rinse your hair and body thoroughly to remove the shampoo.  Then [employer] and genitals (private parts) with your normal soap and rinse thoroughly to remove  soap.  After that Use CHG Soap as you would any other liquid soap. You can apply CHG directly to the skin and wash gently with a scrungie or a clean washcloth.   Apply the CHG Soap to your body ONLY FROM THE NECK DOWN.  Do not use on open wounds or open sores. Avoid contact with your eyes, ears, mouth and genitals (private parts). Wash Face and genitals (private parts)  with your normal soap.   Wash thoroughly, paying special attention to the area where your surgery will be performed.  Thoroughly rinse your body with warm water from the neck down.  DO NOT  shower/wash with your normal soap after using and rinsing off the CHG Soap.  Pat yourself dry with a CLEAN TOWEL.  Wear CLEAN PAJAMAS to bed the night before surgery  Place CLEAN SHEETS on your bed the night before your surgery  DO NOT SLEEP WITH PETS.   Day of Surgery:  Take a shower with CHG soap. Wear Clean/Comfortable clothing the morning of surgery Do not wear jewelry or makeup. Do not wear lotions, powders, perfumes/cologne or deodorant. Do not shave 48 hours prior to surgery.  Men may shave face and neck. Do not bring valuables to the hospital. Do not wear nail polish, gel polish, artificial nails, or any other type of covering on natural nails (fingers and toes) If you have artificial nails or gel coating that need to be removed by a nail salon, please have this removed prior to surgery. Artificial nails or gel coating may interfere with anesthesia's ability to adequately monitor your vital signs.  Remember to brush your teeth WITH YOUR REGULAR TOOTHPASTE.    If you received a COVID test during your pre-op visit, it is requested that you wear a mask when out in public, stay away from anyone that may not be feeling well, and notify your surgeon if you develop symptoms. If you have been in contact with anyone that has tested positive in the last 10 days, please notify your surgeon.    Please read over the following fact sheets that you were given.

## 2022-08-09 NOTE — Telephone Encounter (Signed)
Please advise.----DD,RMA

## 2022-08-10 ENCOUNTER — Encounter (HOSPITAL_COMMUNITY)
Admission: RE | Admit: 2022-08-10 | Discharge: 2022-08-10 | Disposition: A | Discharge status: Home / Self Care | Payer: Medicare Other | Source: Ambulatory Visit | Attending: Orthopedic Surgery | Admitting: Orthopedic Surgery

## 2022-08-10 ENCOUNTER — Other Ambulatory Visit: Payer: Self-pay

## 2022-08-10 ENCOUNTER — Encounter (HOSPITAL_COMMUNITY): Payer: Self-pay

## 2022-08-10 DIAGNOSIS — E1169 Type 2 diabetes mellitus with other specified complication: Secondary | ICD-10-CM | POA: Insufficient documentation

## 2022-08-10 DIAGNOSIS — Z01818 Encounter for other preprocedural examination: Secondary | ICD-10-CM | POA: Insufficient documentation

## 2022-08-10 HISTORY — DX: Type 2 diabetes mellitus without complications: E11.9

## 2022-08-10 LAB — CBC
HCT: 39.8 % (ref 36.0–46.0)
Hemoglobin: 13.2 g/dL (ref 12.0–15.0)
MCH: 26.1 pg (ref 26.0–34.0)
MCHC: 33.2 g/dL (ref 30.0–36.0)
MCV: 78.8 fL — ABNORMAL LOW (ref 80.0–100.0)
Platelets: 348 10*3/uL (ref 150–400)
RBC: 5.05 MIL/uL (ref 3.87–5.11)
RDW: 15 % (ref 11.5–15.5)
WBC: 8.5 10*3/uL (ref 4.0–10.5)
nRBC: 0 % (ref 0.0–0.2)

## 2022-08-10 LAB — TYPE AND SCREEN
ABO/RH(D): O POS
Antibody Screen: NEGATIVE

## 2022-08-10 LAB — BASIC METABOLIC PANEL
Anion gap: 4 — ABNORMAL LOW (ref 5–15)
BUN: 11 mg/dL (ref 8–23)
CO2: 24 mmol/L (ref 22–32)
Calcium: 9.7 mg/dL (ref 8.9–10.3)
Chloride: 109 mmol/L (ref 98–111)
Creatinine, Ser: 1.19 mg/dL — ABNORMAL HIGH (ref 0.44–1.00)
GFR, Estimated: 49 mL/min — ABNORMAL LOW (ref >60)
Glucose, Bld: 92 mg/dL (ref 70–99)
Potassium: 3.9 mmol/L (ref 3.5–5.1)
Sodium: 137 mmol/L (ref 135–145)

## 2022-08-10 LAB — SURGICAL PCR SCREEN
MRSA, PCR: NEGATIVE
Staphylococcus aureus: NEGATIVE

## 2022-08-10 LAB — GLUCOSE, CAPILLARY: Glucose-Capillary: 105 mg/dL — ABNORMAL HIGH (ref 70–99)

## 2022-08-10 NOTE — Pre-Procedure Instructions (Signed)
Surgical Instructions    Your procedure is scheduled on Monday September 25.  Report to [facility] Main Entrance "A" at 5:30 A.M., then check in with the Admitting office.  Call this number if you have problems the morning of surgery:  [phone]   If you have any questions prior to your surgery date call [phone]: Open Monday-Friday 8am-4pm    Remember:  Do not eat or drink after midnight the night before your surgery     Take these medicines the morning of surgery with A SIP OF WATER:  amLODipine (NORVASC) escitalopram (LEXAPRO) metoprolol succinate (TOPROL-XL)  diphenhydrAMINE (BENADRYL)  if needed  As of today, STOP taking any Aspirin (unless otherwise instructed by your surgeon) Aleve, Naproxen, Ibuprofen, Motrin, Advil, Goody's, BC's, all herbal medications, fish oil, and all vitamins.    HOW TO MANAGE YOUR DIABETES BEFORE AND AFTER SURGERY  Why is it important to control my blood sugar before and after surgery? Improving blood sugar levels before and after surgery helps healing and can limit problems. A way of improving blood sugar control is eating a healthy diet by:  Eating less sugar and carbohydrates  Increasing activity/exercise  Talking with your doctor about reaching your blood sugar goals High blood sugars (greater than 180 mg/dL) can raise your risk of infections and slow your recovery, so you will need to focus on controlling your diabetes during the weeks before surgery. Make sure that the doctor who takes care of your diabetes knows about your planned surgery including the date and location.  How do I manage my blood sugar before surgery? Check your blood sugar at least 4 times a day, starting 2 days before surgery, to make sure that the level is not too high or low.  Check your blood sugar the morning of your surgery when you wake up and every 2 hours until you get to the Short Stay unit.  If your blood sugar is less than 70 mg/dL, you will need to  treat for low blood sugar: Do not take insulin. Treat a low blood sugar (less than 70 mg/dL) with  cup of clear juice (cranberry or apple), 4 glucose tablets, OR glucose gel. Recheck blood sugar in 15 minutes after treatment (to make sure it is greater than 70 mg/dL). If your blood sugar is not greater than 70 mg/dL on recheck, call [phone] for further instructions. Report your blood sugar to the short stay nurse when you get to Short Stay.  If you are admitted to the hospital after surgery: Your blood sugar will be checked by the staff and you will probably be given insulin after surgery (instead of oral diabetes medicines) to make sure you have good blood sugar levels. The goal for blood sugar control after surgery is 80-180 mg/dL.           [facility] is not responsible for any belongings or valuables.    Do NOT Smoke (Tobacco/Vaping)  24 hours prior to your procedure  If you use a CPAP at night, you may bring your mask for your overnight stay.   Contacts, glasses, hearing aids, dentures or partials may not be worn into surgery, please bring cases for these belongings   For patients admitted to the hospital, discharge time will be determined by your treatment team.   Patients discharged the day of surgery will not be allowed to drive home, and someone needs to stay with them for 24 hours.   SURGICAL WAITING ROOM VISITATION Patients having surgery  or a procedure may have no more than 2 support people in the waiting area - these visitors may rotate.   Children under the age of 57 must have an adult with them who is not the patient. If the patient needs to stay at the hospital during part of their recovery, the visitor guidelines for inpatient rooms apply. Pre-op nurse will coordinate an appropriate time for 1 support person to accompany patient in pre-op.  This support person may not rotate.   Please refer to the [facility] website for the visitor guidelines for Inpatients (after  your surgery is over and you are in a regular room).    Special instructions:    Oral Hygiene is also important to reduce your risk of infection.  Remember - BRUSH YOUR TEETH THE MORNING OF SURGERY WITH YOUR REGULAR TOOTHPASTE   [facility]- Preparing For Surgery  Before surgery, you can play an important role. Because skin is not sterile, your skin needs to be as free of germs as possible. You can reduce the number of germs on your skin by washing with CHG (chlorahexidine gluconate) Soap before surgery.  CHG is an antiseptic cleaner which kills germs and bonds with the skin to continue killing germs even after washing.     Please do not use if you have an allergy to CHG or antibacterial soaps. If your skin becomes reddened/irritated stop using the CHG.  Do not shave (including legs and underarms) for at least 48 hours prior to first CHG shower. It is OK to shave your face.  Please follow these instructions carefully.     Shower the NIGHT BEFORE SURGERY and the MORNING OF SURGERY with CHG Soap.   If you chose to wash your hair, wash your hair first as usual with your normal shampoo. After you shampoo, rinse your hair and body thoroughly to remove the shampoo.  Then [employer] and genitals (private parts) with your normal soap and rinse thoroughly to remove soap.  After that Use CHG Soap as you would any other liquid soap. You can apply CHG directly to the skin and wash gently with a scrungie or a clean washcloth.   Apply the CHG Soap to your body ONLY FROM THE NECK DOWN.  Do not use on open wounds or open sores. Avoid contact with your eyes, ears, mouth and genitals (private parts). Wash Face and genitals (private parts)  with your normal soap.   Wash thoroughly, paying special attention to the area where your surgery will be performed.  Thoroughly rinse your body with warm water from the neck down.  DO NOT shower/wash with your normal soap after using and rinsing off the CHG  Soap.  Pat yourself dry with a CLEAN TOWEL.  Wear CLEAN PAJAMAS to bed the night before surgery  Place CLEAN SHEETS on your bed the night before your surgery  DO NOT SLEEP WITH PETS.   Day of Surgery:  Take a shower with CHG soap. Wear Clean/Comfortable clothing the morning of surgery Do not wear jewelry or makeup. Do not wear lotions, powders, perfumes/cologne or deodorant. Do not shave 48 hours prior to surgery.  Men may shave face and neck. Do not bring valuables to the hospital. Do not wear nail polish, gel polish, artificial nails, or any other type of covering on natural nails (fingers and toes) If you have artificial nails or gel coating that need to be removed by a nail salon, please have this removed prior to surgery. Artificial nails  or gel coating may interfere with anesthesia's ability to adequately monitor your vital signs.  Remember to brush your teeth WITH YOUR REGULAR TOOTHPASTE.    If you received a COVID test during your pre-op visit, it is requested that you wear a mask when out in public, stay away from anyone that may not be feeling well, and notify your surgeon if you develop symptoms. If you have been in contact with anyone that has tested positive in the last 10 days, please notify your surgeon.    Please read over the following fact sheets that you were given.

## 2022-08-10 NOTE — Progress Notes (Signed)
PCP - Dr. Karle Plumber Cardiologist - Dr. Floydene Flock  PPM/ICD - denies   Chest x-ray - 06/06/21 EKG - 07/18/22 Stress Test - 07/12/22 ECHO - 08/05/20 Cardiac Cath - denies  Sleep Study - 4-5 years ago, OSA+ CPAP - nightly  DM- Type 2 Fasting Blood Sugar - 90-110 Checks Blood Sugar twice a day  ASA/Blood Thinner Instructions: n/a   ERAS Protcol - no, NPO   COVID TEST- n/a   Anesthesia review: yes, cardiac hx  Patient denies shortness of breath, fever, cough and chest pain at PAT appointment   All instructions explained to the patient, with a verbal understanding of the material. Patient agrees to go over the instructions while at home for a better understanding. The opportunity to ask questions was provided.

## 2022-08-11 ENCOUNTER — Telehealth: Payer: Self-pay | Admitting: Emergency Medicine

## 2022-08-11 NOTE — Telephone Encounter (Addendum)
Andrina pt's daughter called in to provider the insurance prior authorization's number to send the letter to requesting patient's bed.    [phone]   Pt will be having surgery on 9/25 and 9/26, she would like to have this completed before pt's surgery.    Phone: [phone]

## 2022-08-11 NOTE — Telephone Encounter (Signed)
Pt was called to schedule a appointment to get the office notes for the hospital bed after reviewing the schedule her PCP is out of office next week and will not return until her surgery date. Pt was informed to contact insurance company and inquire if office notes are needed for the hospital bed, if not needed she was informed that script can be written and faxed to [place].  Can we print a script just in case she call back while you are out next week, I will hold on to the script.

## 2022-08-11 NOTE — Telephone Encounter (Signed)
Duplicate message. 

## 2022-08-11 NOTE — Progress Notes (Signed)
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 She is also scheduled for another surgery by Dr. Rolena Infante on 08/22/22 posterior ear spinal fusion interbody L3 3 5, possible decompression.  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  BMI is consistent with obesity.  She moved from [place] in 2019.  Patient had preoperative cardiology evaluation on 07/18/22 by Floydene Flock, DO at [facility]. Patient had normal myocardial perfusion on 07/12/22 stress test. Per Dr. Shellia Carwin, "Pre-op evaluation for lumbar fusion (low-intermediate risk surgery) Continue current cardiac medications. Encourage low-sodium diet, less than 2000 mg daily. EKG unchanged from prior. Patient reaches at least 6 METS per DASI, ok to proceed with surgery."  She also had preoperative evaluation by her PCP Dr. Karle Plumber on 07/24/2022.  She wrote "patient maximized for surgery."  Anesthesia team to evaluate on the day of surgery.   VS: BP 113/69   Pulse 65   Temp 36.8 C (Oral)   Resp 17   Ht 5' (1.524 m)   Wt 92.8 kg   SpO2 99%   BMI 39.96 kg/m    PROVIDERS: Ladell Pier, MD is PCP  Adrian Prows, MD is cardiologist, more recently saw CustovicCollene Mares, DO for preoperative evaluation  on 07/18/22. Six month follow-up planned. Nicholas Lose, MD is HEM-ONC Gery Pray, MD is RAD-ONC   LABS: Labs reviewed: Acceptable for surgery. A1c 6.1% on 07/24/22. (all labs ordered are listed, but only abnormal results are displayed)  Labs Reviewed  GLUCOSE, CAPILLARY - Abnormal; Notable for the following components:      Result Value   Glucose-Capillary 105 (*)    All other components within normal limits  BASIC METABOLIC PANEL - Abnormal; Notable for the following components:   Creatinine, Ser 1.19 (*)    GFR, Estimated 49 (*)    Anion gap 4 (*)    All other components within normal limits  CBC - Abnormal; Notable for the following components:   MCV 78.8 (*)    All other components within normal limits  SURGICAL PCR SCREEN  TYPE AND SCREEN    IMAGES: CT Head 07/07/22: IMPRESSION: - No evidence of acute intracranial abnormality. - Redemonstrated ill-defined foci of hypoattenuation within the right external capsule, which may reflect changes of chronic small vessel ischemia or prominent perivascular spaces. - Mild generalized parenchymal atrophy.   CT Head/C-spine 12/30/21: IMPRESSION: 1. No acute intracranial abnormality. Small right supraorbital frontal scalp hematoma. 2. No acute cervical spine fracture or traumatic malalignment.    EKG:  EKG 07/18/22: Sinus Rhythm  -Right bundle branch block with left axis -bifascicular block. -Left atrial enlargement. Voltage criteria for LVH (R(aVL) exceeds 1.26 mV) -Voltage criteria w/o ST/T abnormality may be normal.     CV: Lexiscan (with Mod Bruce protocol) Nuclear stress test 07/12/22 Myocardial perfusion is normal. Overall LV systolic  function is normal without regional wall motion abnormalities. Stress LV EF: 57%. Low risk study. Nondiagnostic ECG stress. The heart rate response was consistent with Regadenoson. The blood pressure response was physiologic. No previous exam available for comparison.     Echocardiogram 35/70/1779 Normal LV systolic function with visual EF 50-55%. Left ventricle cavity  is normal in size. Severe left ventricular hypertrophy. Normal global wall  motion. Normal diastolic filling pattern, normal LAP. Calculated EF 55%.  Mild (Grade I) aortic regurgitation.  Mild tricuspid regurgitation. No evidence of pulmonary hypertension.  No prior study for comparison.   Past Medical History:  Diagnosis Date   Anxiety    Cancer ([place]) 03/21/2020   Breast Cancer   Cardiomegaly 03/13/2019   Diabetes mellitus without complication (HCC)    type 2   Glaucoma    HTN (hypertension)    Lumbar spine pain    OSA on CPAP    Wears hearing aid     Past Surgical History:  Procedure Laterality Date   BREAST LUMPECTOMY WITH RADIOACTIVE SEED LOCALIZATION Left 07/19/2020   Procedure: LEFT BREAST LUMPECTOMY WITH RADIOACTIVE SEED LOCALIZATION;  Surgeon: Jovita Kussmaul, MD;  Location: [place];  Service: General;  Laterality: Left;   BREAST REDUCTION SURGERY Bilateral 07/19/2020   Procedure: BILATERAL MAMMARY REDUCTION  (BREAST) ONCO PLASTIC FOR LEFT BREAST CANCER;  Surgeon: Wallace Going, DO;  Location: [place];  Service: Plastics;  Laterality: Bilateral;   EYE SURGERY Bilateral    cataract extraction   PARTIAL HYSTERECTOMY     TUBAL LIGATION  1982    MEDICATIONS:  Accu-Chek Softclix Lancets lancets   amLODipine (NORVASC) 10 MG tablet   Blood Glucose Monitoring Suppl (ACCU-CHEK GUIDE) w/Device KIT   diphenhydrAMINE (BENADRYL) 25 MG tablet   escitalopram (LEXAPRO) 10 MG tablet   glucose blood (ACCU-CHEK GUIDE) test strip   ibuprofen (ADVIL) 200 MG tablet   latanoprost (XALATAN) 0.005 % ophthalmic solution   MAGNESIUM PO   metoprolol succinate (TOPROL-XL) 50 MG 24 hr tablet   NON FORMULARY   rosuvastatin (CRESTOR) 5 MG tablet   solifenacin (VESICARE) 5 MG tablet   traZODone (DESYREL) 100 MG tablet   zinc gluconate 50 MG tablet   No current facility-administered  medications for this encounter.    Myra Gianotti, PA-C Surgical Short Stay/Anesthesiology [facility] Phone [phone] [facility] Phone [phone] 08/11/2022 6:39 PM

## 2022-08-11 NOTE — Telephone Encounter (Signed)
Copied from [place] [id number]. Topic: General - Inquiry >> Aug 10, 2022  4:27 PM Erskine Squibb wrote: Reason for CRM: The patient called in stating the hospital bed will only be covered by insurance if its through the primary provider and not the surgeon. She has [employer] and states she would like to get this ordered before her surgery on 9/26. Please assist patient further >> Aug 10, 2022  4:30 PM Erskine Squibb wrote: The patient states you can also talk to her daughter William Dalton at [phone]

## 2022-08-11 NOTE — Anesthesia Preprocedure Evaluation (Addendum)
Anesthesia Evaluation  Patient identified by MRN, date of birth, ID band Patient awake    Reviewed: Allergy & Precautions, NPO status , Patient's Chart, lab work & pertinent test results, reviewed documented beta blocker date and time   History of Anesthesia Complications Negative for: history of anesthetic complications  Airway Mallampati: II  TM Distance: >3 FB Neck ROM: Full    Dental  (+) Missing,    Pulmonary sleep apnea and Continuous Positive Airway Pressure Ventilation ,    Pulmonary exam normal        Cardiovascular hypertension, Pt. on medications and Pt. on home beta blockers Normal cardiovascular exam  Nuclear stress test 07/12/22: EF 57%. 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  Reproductive/Obstetrics negative OB ROS                           Anesthesia Physical Anesthesia Plan  ASA: 3  Anesthesia Plan: General   Post-op Pain Management: Regional block* and Tylenol PO (pre-op)*   Induction: Intravenous  PONV Risk Score and Plan: 3 and Treatment may vary due to age or medical condition, Ondansetron, Dexamethasone and Midazolam  Airway Management Planned: Oral ETT  Additional Equipment: Arterial line  Intra-op Plan:   Post-operative Plan: Extubation in OR  Informed Consent: I have reviewed the patients History and Physical, chart, labs and discussed the procedure including the risks, benefits and alternatives  for the proposed anesthesia with the patient or authorized representative who has indicated his/her understanding and acceptance.     Dental advisory given  Plan Discussed with: CRNA  Anesthesia Plan Comments: (PAT note written 08/11/2022 by Myra Gianotti, PA-C. )     Anesthesia Quick Evaluation

## 2022-08-14 NOTE — Telephone Encounter (Signed)
Call received from the patient inquiring about the status of the order for her hospital bed.  She said that she has spoken to [employer] and they need the order for the bed to come from PCP, not the specialist.  She stated that they told her she will qualify.  I explained to her that her insurance company may say that she has a benefit for the hospital bed but that does not mean that it will be approved. The provider who orders the bed usually needs documentation to support the order for the bed and Dr Wynetta Emery would not have that.   She said she is sleeping on a couch and will need the bed after surgery.  I explained that just because she is on a couch , does not mean that her insurance company will pay for it,  I will need to call her insurance company to obtain more information. She said she understood and will speak with me tomorrow.

## 2022-08-15 DIAGNOSIS — M5459 Other low back pain: Secondary | ICD-10-CM | POA: Diagnosis not present

## 2022-08-15 NOTE — Telephone Encounter (Signed)
I called the [facility] number that the patient provided [phone] and that [facility] number is no longer in service. I then called [phone] /provider services and spoke to Jacquelynn Cree about the patient's request for a hospital bed.  She said that the patient does not need referrals for anything and she could not find anything that would require the PCP to place that order.  She was also not able to determine who informed the patient that the PCP needs to order the hospital bed.  Ref number last 4 digits : [id number].  I called [facility]- Dr Rolena Infante' office  # [phone] and spoke to [facility] regarding the hospital bed request and informed her of my conversation with [facility]. Dr Wynetta Emery does not have documentation at this time to support the need for the hospital bed. She said she would send a message to Dr Rolena Infante requesting advice regarding this request   I called patient and left a message requesting she return my call. I want to inform her of my call with [facility] as well as with the surgeon's office.

## 2022-08-15 NOTE — Telephone Encounter (Signed)
The patient returned my call and I explained my conversation with [facility] as well as Dr Rolena Infante' office.  I told her that Dr Rolena Infante' office is to call me back with any questions. I also explained that the hospital SW or CM will usually order the DME, including the bed when she is ready for discharge from the hospital.  She said she understood and was very appreciative.

## 2022-08-21 ENCOUNTER — Inpatient Hospital Stay (HOSPITAL_COMMUNITY)
Admission: RE | Admit: 2022-08-21 | Discharge: 2022-08-24 | DRG: 455 | Disposition: A | Discharge status: Home Health | Payer: Medicare Other | Attending: Orthopedic Surgery | Admitting: Orthopedic Surgery

## 2022-08-21 ENCOUNTER — Other Ambulatory Visit: Payer: Self-pay

## 2022-08-21 ENCOUNTER — Encounter (HOSPITAL_COMMUNITY)
Admission: RE | Disposition: A | Discharge status: Home Health | Payer: Self-pay | Source: Home / Self Care | Attending: Orthopedic Surgery

## 2022-08-21 ENCOUNTER — Inpatient Hospital Stay (HOSPITAL_COMMUNITY): Payer: Medicare Other

## 2022-08-21 ENCOUNTER — Inpatient Hospital Stay (HOSPITAL_COMMUNITY): Payer: Medicare Other | Admitting: Vascular Surgery

## 2022-08-21 ENCOUNTER — Inpatient Hospital Stay (HOSPITAL_COMMUNITY): Payer: Medicare Other | Admitting: Anesthesiology

## 2022-08-21 ENCOUNTER — Encounter (HOSPITAL_COMMUNITY): Payer: Self-pay | Admitting: Orthopedic Surgery

## 2022-08-21 DIAGNOSIS — M4316 Spondylolisthesis, lumbar region: Secondary | ICD-10-CM

## 2022-08-21 DIAGNOSIS — Z853 Personal history of malignant neoplasm of breast: Secondary | ICD-10-CM | POA: Diagnosis not present

## 2022-08-21 DIAGNOSIS — M4306 Spondylolysis, lumbar region: Secondary | ICD-10-CM | POA: Diagnosis not present

## 2022-08-21 DIAGNOSIS — M549 Dorsalgia, unspecified: Secondary | ICD-10-CM | POA: Diagnosis not present

## 2022-08-21 DIAGNOSIS — M4326 Fusion of spine, lumbar region: Secondary | ICD-10-CM | POA: Diagnosis not present

## 2022-08-21 DIAGNOSIS — G8918 Other acute postprocedural pain: Secondary | ICD-10-CM | POA: Diagnosis not present

## 2022-08-21 DIAGNOSIS — Z800 Family history of malignant neoplasm of digestive organs: Secondary | ICD-10-CM | POA: Diagnosis not present

## 2022-08-21 DIAGNOSIS — Z981 Arthrodesis status: Secondary | ICD-10-CM | POA: Diagnosis not present

## 2022-08-21 DIAGNOSIS — I10 Essential (primary) hypertension: Secondary | ICD-10-CM | POA: Diagnosis not present

## 2022-08-21 DIAGNOSIS — Z9989 Dependence on other enabling machines and devices: Secondary | ICD-10-CM | POA: Diagnosis not present

## 2022-08-21 DIAGNOSIS — E1169 Type 2 diabetes mellitus with other specified complication: Secondary | ICD-10-CM

## 2022-08-21 DIAGNOSIS — M48061 Spinal stenosis, lumbar region without neurogenic claudication: Secondary | ICD-10-CM

## 2022-08-21 DIAGNOSIS — Z974 Presence of external hearing-aid: Secondary | ICD-10-CM

## 2022-08-21 DIAGNOSIS — Z79899 Other long term (current) drug therapy: Secondary | ICD-10-CM | POA: Diagnosis not present

## 2022-08-21 DIAGNOSIS — Z0389 Encounter for observation for other suspected diseases and conditions ruled out: Secondary | ICD-10-CM | POA: Diagnosis not present

## 2022-08-21 DIAGNOSIS — G4733 Obstructive sleep apnea (adult) (pediatric): Secondary | ICD-10-CM | POA: Diagnosis present

## 2022-08-21 DIAGNOSIS — F419 Anxiety disorder, unspecified: Secondary | ICD-10-CM | POA: Diagnosis present

## 2022-08-21 DIAGNOSIS — M48062 Spinal stenosis, lumbar region with neurogenic claudication: Principal | ICD-10-CM | POA: Diagnosis present

## 2022-08-21 DIAGNOSIS — G8929 Other chronic pain: Secondary | ICD-10-CM | POA: Diagnosis not present

## 2022-08-21 HISTORY — PX: ABDOMINAL EXPOSURE: SHX5708

## 2022-08-21 LAB — GLUCOSE, CAPILLARY
Glucose-Capillary: 108 mg/dL — ABNORMAL HIGH (ref 70–99)
Glucose-Capillary: 187 mg/dL — ABNORMAL HIGH (ref 70–99)
Glucose-Capillary: 155 mg/dL — ABNORMAL HIGH (ref 70–99)
Glucose-Capillary: 138 mg/dL — ABNORMAL HIGH (ref 70–99)

## 2022-08-21 LAB — ABO/RH: ABO/RH(D): O POS

## 2022-08-21 SURGERY — OBLIQUE LUMBAR INTERBODY FUSION 2 LEVEL
Anesthesia: General

## 2022-08-21 MED ORDER — BUPIVACAINE-EPINEPHRINE 0.25% -1:200000 IJ SOLN
INTRAMUSCULAR | Status: DC | PRN
  Administered 2022-08-21: 10 mL

## 2022-08-21 MED ORDER — METOPROLOL SUCCINATE ER 50 MG PO TB24
50.0000 mg | ORAL_TABLET | Freq: Every evening | ORAL | Status: DC
  Administered 2022-08-21 – 2022-08-23 (×3): 50 mg via ORAL
  Filled 2022-08-21 (×3): qty 1

## 2022-08-21 MED ORDER — CEFAZOLIN SODIUM-DEXTROSE 2-4 GM/100ML-% IV SOLN
2.0000 g | INTRAVENOUS | Status: AC
  Administered 2022-08-21: 2 g via INTRAVENOUS

## 2022-08-21 MED ORDER — MIDAZOLAM HCL 2 MG/2ML IJ SOLN
INTRAMUSCULAR | Status: DC | PRN
  Administered 2022-08-21 (×2): 1 mg via INTRAVENOUS

## 2022-08-21 MED ORDER — BUPIVACAINE-EPINEPHRINE (PF) 0.5% -1:200000 IJ SOLN
INTRAMUSCULAR | Status: DC | PRN
  Administered 2022-08-21: 20 mL via PERINEURAL

## 2022-08-21 MED ORDER — MENTHOL 3 MG MT LOZG: 1.0000 | LOZENGE | OROMUCOSAL | Status: DC | PRN

## 2022-08-21 MED ORDER — AMLODIPINE BESYLATE 5 MG PO TABS
10.0000 mg | ORAL_TABLET | Freq: Every day | ORAL | Status: DC
  Administered 2022-08-23 – 2022-08-24 (×2): 10 mg via ORAL
  Filled 2022-08-21: qty 2
  Filled 2022-08-21 (×2): qty 1
  Filled 2022-08-21: qty 2

## 2022-08-21 MED ORDER — THROMBIN 20000 UNITS EX SOLR
CUTANEOUS | Status: DC | PRN
  Administered 2022-08-21: 20 mL via TOPICAL

## 2022-08-21 MED ORDER — LIDOCAINE 2% (20 MG/ML) 5 ML SYRINGE
INTRAMUSCULAR | Status: AC
  Filled 2022-08-21: qty 5

## 2022-08-21 MED ORDER — MIDAZOLAM HCL 2 MG/2ML IJ SOLN
INTRAMUSCULAR | Status: AC
  Filled 2022-08-21: qty 2

## 2022-08-21 MED ORDER — CHLORHEXIDINE GLUCONATE 0.12 % MT SOLN
OROMUCOSAL | Status: AC
  Administered 2022-08-21: 15 mL
  Filled 2022-08-21: qty 15

## 2022-08-21 MED ORDER — LACTATED RINGERS IV SOLN: INTRAVENOUS | Status: DC

## 2022-08-21 MED ORDER — PROPOFOL 10 MG/ML IV BOLUS
INTRAVENOUS | Status: DC | PRN
  Administered 2022-08-21: 150 mg via INTRAVENOUS

## 2022-08-21 MED ORDER — PROPOFOL 10 MG/ML IV BOLUS
INTRAVENOUS | Status: AC
  Filled 2022-08-21: qty 20

## 2022-08-21 MED ORDER — SODIUM CHLORIDE 0.9% FLUSH
3.0000 mL | Freq: Two times a day (BID) | INTRAVENOUS | Status: DC
  Administered 2022-08-21 (×2): 3 mL via INTRAVENOUS

## 2022-08-21 MED ORDER — OXYCODONE HCL 5 MG PO TABS
5.0000 mg | ORAL_TABLET | ORAL | Status: DC | PRN
  Administered 2022-08-24: 5 mg via ORAL
  Filled 2022-08-21: qty 1

## 2022-08-21 MED ORDER — DEXAMETHASONE SODIUM PHOSPHATE 10 MG/ML IJ SOLN
INTRAMUSCULAR | Status: DC | PRN
  Administered 2022-08-21: 10 mg via INTRAVENOUS

## 2022-08-21 MED ORDER — DEXAMETHASONE SODIUM PHOSPHATE 10 MG/ML IJ SOLN
INTRAMUSCULAR | Status: AC
  Filled 2022-08-21: qty 1

## 2022-08-21 MED ORDER — METHOCARBAMOL 500 MG PO TABS
500.0000 mg | ORAL_TABLET | Freq: Four times a day (QID) | ORAL | Status: DC | PRN
  Administered 2022-08-21: 500 mg via ORAL
  Filled 2022-08-21: qty 1

## 2022-08-21 MED ORDER — DEXMEDETOMIDINE HCL IN NACL 80 MCG/20ML IV SOLN
INTRAVENOUS | Status: DC | PRN
  Administered 2022-08-21: 8 ug via BUCCAL
  Administered 2022-08-21: 4 ug via BUCCAL

## 2022-08-21 MED ORDER — ACETAMINOPHEN 10 MG/ML IV SOLN: 1000.0000 mg | Freq: Once | INTRAVENOUS | Status: DC | PRN

## 2022-08-21 MED ORDER — ONDANSETRON HCL 4 MG/2ML IJ SOLN: 4.0000 mg | Freq: Four times a day (QID) | INTRAMUSCULAR | Status: DC | PRN

## 2022-08-21 MED ORDER — PHENYLEPHRINE 80 MCG/ML (10ML) SYRINGE FOR IV PUSH (FOR BLOOD PRESSURE SUPPORT)
PREFILLED_SYRINGE | INTRAVENOUS | Status: DC | PRN
  Administered 2022-08-21: 80 ug via INTRAVENOUS

## 2022-08-21 MED ORDER — LATANOPROST 0.005 % OP SOLN
1.0000 [drp] | Freq: Every day | OPHTHALMIC | Status: DC
  Administered 2022-08-22 – 2022-08-23 (×2): 1 [drp] via OPHTHALMIC
  Filled 2022-08-21: qty 2.5

## 2022-08-21 MED ORDER — BUPIVACAINE LIPOSOME 1.3 % IJ SUSP
INTRAMUSCULAR | Status: DC | PRN
  Administered 2022-08-21: 10 mL via PERINEURAL

## 2022-08-21 MED ORDER — SURGIFLO WITH THROMBIN (HEMOSTATIC MATRIX KIT) OPTIME
TOPICAL | Status: DC | PRN
  Administered 2022-08-21: 1 via TOPICAL

## 2022-08-21 MED ORDER — SUCCINYLCHOLINE CHLORIDE 200 MG/10ML IV SOSY
PREFILLED_SYRINGE | INTRAVENOUS | Status: AC
  Filled 2022-08-21: qty 10

## 2022-08-21 MED ORDER — PHENOL 1.4 % MT LIQD: 1.0000 | OROMUCOSAL | Status: DC | PRN

## 2022-08-21 MED ORDER — DEXMEDETOMIDINE HCL IN NACL 80 MCG/20ML IV SOLN
INTRAVENOUS | Status: AC
  Filled 2022-08-21: qty 20

## 2022-08-21 MED ORDER — FLEET ENEMA 7-19 GM/118ML RE ENEM: 1.0000 | ENEMA | Freq: Once | RECTAL | Status: DC | PRN

## 2022-08-21 MED ORDER — ONDANSETRON HCL 4 MG/2ML IJ SOLN
INTRAMUSCULAR | Status: AC
  Filled 2022-08-21: qty 2

## 2022-08-21 MED ORDER — POLYETHYLENE GLYCOL 3350 17 G PO PACK: 17.0000 g | PACK | Freq: Every day | ORAL | Status: DC | PRN

## 2022-08-21 MED ORDER — CEFAZOLIN SODIUM-DEXTROSE 2-4 GM/100ML-% IV SOLN
INTRAVENOUS | Status: AC
  Filled 2022-08-21: qty 100

## 2022-08-21 MED ORDER — HYDROMORPHONE HCL 1 MG/ML IJ SOLN
0.2500 mg | INTRAMUSCULAR | Status: DC | PRN
  Administered 2022-08-21: 0.25 mg via INTRAVENOUS

## 2022-08-21 MED ORDER — SODIUM CHLORIDE 0.9% FLUSH: 3.0000 mL | INTRAVENOUS | Status: DC | PRN

## 2022-08-21 MED ORDER — FENTANYL CITRATE (PF) 250 MCG/5ML IJ SOLN
INTRAMUSCULAR | Status: AC
  Filled 2022-08-21: qty 5

## 2022-08-21 MED ORDER — PHENYLEPHRINE HCL-NACL 20-0.9 MG/250ML-% IV SOLN
INTRAVENOUS | Status: DC | PRN
  Administered 2022-08-21: 25 ug/min via INTRAVENOUS

## 2022-08-21 MED ORDER — LACTATED RINGERS IV SOLN: INTRAVENOUS | Status: DC | PRN

## 2022-08-21 MED ORDER — BUPIVACAINE-EPINEPHRINE (PF) 0.25% -1:200000 IJ SOLN
INTRAMUSCULAR | Status: AC
  Filled 2022-08-21: qty 30

## 2022-08-21 MED ORDER — ACETAMINOPHEN 325 MG PO TABS: 650.0000 mg | ORAL_TABLET | ORAL | Status: DC | PRN

## 2022-08-21 MED ORDER — SUGAMMADEX SODIUM 200 MG/2ML IV SOLN
INTRAVENOUS | Status: DC | PRN
  Administered 2022-08-21: 200 mg via INTRAVENOUS

## 2022-08-21 MED ORDER — 0.9 % SODIUM CHLORIDE (POUR BTL) OPTIME
TOPICAL | Status: DC | PRN
  Administered 2022-08-21 (×2): 1000 mL

## 2022-08-21 MED ORDER — HYDROMORPHONE HCL 1 MG/ML IJ SOLN
INTRAMUSCULAR | Status: AC
  Filled 2022-08-21: qty 1

## 2022-08-21 MED ORDER — CEFAZOLIN SODIUM-DEXTROSE 1-4 GM/50ML-% IV SOLN
1.0000 g | Freq: Three times a day (TID) | INTRAVENOUS | Status: AC
  Administered 2022-08-21 (×2): 1 g via INTRAVENOUS
  Filled 2022-08-21 (×2): qty 50

## 2022-08-21 MED ORDER — CHLORHEXIDINE GLUCONATE CLOTH 2 % EX PADS: 6.0000 | MEDICATED_PAD | Freq: Once | CUTANEOUS | Status: DC

## 2022-08-21 MED ORDER — ESCITALOPRAM OXALATE 10 MG PO TABS
10.0000 mg | ORAL_TABLET | Freq: Every day | ORAL | Status: DC
  Administered 2022-08-23 – 2022-08-24 (×2): 10 mg via ORAL
  Filled 2022-08-21 (×2): qty 1

## 2022-08-21 MED ORDER — OXYCODONE HCL 5 MG PO TABS
10.0000 mg | ORAL_TABLET | ORAL | Status: DC | PRN
  Administered 2022-08-21 – 2022-08-24 (×10): 10 mg via ORAL
  Filled 2022-08-21 (×11): qty 2

## 2022-08-21 MED ORDER — LIDOCAINE 2% (20 MG/ML) 5 ML SYRINGE
INTRAMUSCULAR | Status: DC | PRN
  Administered 2022-08-21: 100 mg via INTRAVENOUS

## 2022-08-21 MED ORDER — METHOCARBAMOL 1000 MG/10ML IJ SOLN: 500.0000 mg | Freq: Four times a day (QID) | INTRAVENOUS | Status: DC | PRN

## 2022-08-21 MED ORDER — ACETAMINOPHEN 500 MG PO TABS
1000.0000 mg | ORAL_TABLET | Freq: Once | ORAL | Status: AC
  Administered 2022-08-21: 1000 mg via ORAL
  Filled 2022-08-21: qty 2

## 2022-08-21 MED ORDER — SODIUM CHLORIDE 0.9 % IV SOLN: INTRAVENOUS | Status: DC

## 2022-08-21 MED ORDER — DROPERIDOL 2.5 MG/ML IJ SOLN: 0.6250 mg | Freq: Once | INTRAMUSCULAR | Status: DC | PRN

## 2022-08-21 MED ORDER — ROCURONIUM BROMIDE 10 MG/ML (PF) SYRINGE
PREFILLED_SYRINGE | INTRAVENOUS | Status: DC | PRN
  Administered 2022-08-21: 60 mg via INTRAVENOUS
  Administered 2022-08-21 (×3): 20 mg via INTRAVENOUS

## 2022-08-21 MED ORDER — ACETAMINOPHEN 650 MG RE SUPP: 650.0000 mg | RECTAL | Status: DC | PRN

## 2022-08-21 MED ORDER — ONDANSETRON HCL 4 MG PO TABS: 4.0000 mg | ORAL_TABLET | Freq: Four times a day (QID) | ORAL | Status: DC | PRN

## 2022-08-21 MED ORDER — THROMBIN (RECOMBINANT) 20000 UNITS EX SOLR
CUTANEOUS | Status: AC
  Filled 2022-08-21: qty 20000

## 2022-08-21 MED ORDER — ROCURONIUM BROMIDE 10 MG/ML (PF) SYRINGE
PREFILLED_SYRINGE | INTRAVENOUS | Status: AC
  Filled 2022-08-21: qty 10

## 2022-08-21 MED ORDER — EPHEDRINE SULFATE-NACL 50-0.9 MG/10ML-% IV SOSY
PREFILLED_SYRINGE | INTRAVENOUS | Status: DC | PRN
  Administered 2022-08-21 (×3): 5 mg via INTRAVENOUS

## 2022-08-21 MED ORDER — FENTANYL CITRATE (PF) 250 MCG/5ML IJ SOLN
INTRAMUSCULAR | Status: DC | PRN
  Administered 2022-08-21: 100 ug via INTRAVENOUS

## 2022-08-21 MED ORDER — HYDROMORPHONE HCL 1 MG/ML IJ SOLN
1.0000 mg | INTRAMUSCULAR | Status: DC | PRN
  Administered 2022-08-22: 1 mg via INTRAVENOUS
  Filled 2022-08-21 (×2): qty 1

## 2022-08-21 MED ORDER — ONDANSETRON HCL 4 MG/2ML IJ SOLN
INTRAMUSCULAR | Status: DC | PRN
  Administered 2022-08-21: 4 mg via INTRAVENOUS

## 2022-08-21 SURGICAL SUPPLY — 69 items
APPLIER CLIP 11 MED OPEN (CLIP) ×1
BAG COUNTER SPONGE SURGICOUNT (BAG) ×2 IMPLANT
BLADE SURG 10 STRL SS (BLADE) ×1 IMPLANT
CATH FOLEY 2WAY SLVR  5CC 16FR (CATHETERS) ×1
CATH FOLEY 2WAY SLVR 5CC 16FR (CATHETERS) ×1 IMPLANT
CLIP APPLIE 11 MED OPEN (CLIP) ×1 IMPLANT
CLSR STERI-STRIP ANTIMIC 1/2X4 (GAUZE/BANDAGES/DRESSINGS) IMPLANT
COVER SURGICAL LIGHT HANDLE (MISCELLANEOUS) ×1 IMPLANT
DISSECTOR STICK (MISCELLANEOUS) IMPLANT
DRAPE C-ARM 42X72 X-RAY (DRAPES) ×1 IMPLANT
DRAPE C-ARMOR (DRAPES) ×1 IMPLANT
DRAPE INCISE IOBAN 66X45 STRL (DRAPES) IMPLANT
DRSG MEPILEX POST OP 4X8 (GAUZE/BANDAGES/DRESSINGS) IMPLANT
DRSG OPSITE POSTOP 4X6 (GAUZE/BANDAGES/DRESSINGS) ×1 IMPLANT
DURAPREP 26ML APPLICATOR (WOUND CARE) ×1 IMPLANT
ELECT BLADE 4.0 EZ CLEAN MEGAD (MISCELLANEOUS) ×2
ELECT PENCIL ROCKER SW 15FT (MISCELLANEOUS) ×1 IMPLANT
ELECT REM PT RETURN 9FT ADLT (ELECTROSURGICAL) ×1
ELECTRODE BLDE 4.0 EZ CLN MEGD (MISCELLANEOUS) ×2 IMPLANT
ELECTRODE REM PT RTRN 9FT ADLT (ELECTROSURGICAL) ×1 IMPLANT
GLOVE BIO SURGEON STRL SZ 6.5 (GLOVE) ×1 IMPLANT
GLOVE BIO SURGEON STRL SZ7.5 (GLOVE) ×1 IMPLANT
GLOVE BIOGEL PI IND STRL 6.5 (GLOVE) ×1 IMPLANT
GLOVE BIOGEL PI IND STRL 8 (GLOVE) ×1 IMPLANT
GLOVE BIOGEL PI IND STRL 8.5 (GLOVE) ×1 IMPLANT
GLOVE SS BIOGEL STRL SZ 8.5 (GLOVE) ×1 IMPLANT
GOWN STRL REUS W/ TWL LRG LVL3 (GOWN DISPOSABLE) ×2 IMPLANT
GOWN STRL REUS W/ TWL XL LVL3 (GOWN DISPOSABLE) ×3 IMPLANT
GOWN STRL REUS W/TWL 2XL LVL3 (GOWN DISPOSABLE) ×2 IMPLANT
GOWN STRL REUS W/TWL LRG LVL3 (GOWN DISPOSABLE) ×2
GOWN STRL REUS W/TWL XL LVL3 (GOWN DISPOSABLE) ×3
KIT BASIN OR (CUSTOM PROCEDURE TRAY) ×1 IMPLANT
KIT TURNOVER KIT B (KITS) ×1 IMPLANT
NDL 22X1.5 STRL (OR ONLY) (MISCELLANEOUS) ×1 IMPLANT
NDL SPNL 18GX3.5 QUINCKE PK (NEEDLE) ×1 IMPLANT
NEEDLE 22X1.5 STRL (OR ONLY) (MISCELLANEOUS) ×1 IMPLANT
NEEDLE SPNL 18GX3.5 QUINCKE PK (NEEDLE) ×2 IMPLANT
NS IRRIG 1000ML POUR BTL (IV SOLUTION) ×1 IMPLANT
PACK LAMINECTOMY ORTHO (CUSTOM PROCEDURE TRAY) ×1 IMPLANT
PACK UNIVERSAL I (CUSTOM PROCEDURE TRAY) ×1 IMPLANT
PAD ARMBOARD 7.5X6 YLW CONV (MISCELLANEOUS) ×4 IMPLANT
PUTTY BONE DBX 5CC MIX (Putty) IMPLANT
PUTTY DBM INSTAFILL CART 5CC (Putty) IMPLANT
SPACER RISE-L 18X50 7-14MM (Spacer) IMPLANT
SPONGE INTESTINAL PEANUT (DISPOSABLE) ×2 IMPLANT
SPONGE SURGIFOAM ABS GEL 100 (HEMOSTASIS) ×1 IMPLANT
SPONGE T-LAP 18X18 ~~LOC~~+RFID (SPONGE) ×1 IMPLANT
SPONGE T-LAP 4X18 ~~LOC~~+RFID (SPONGE) ×1 IMPLANT
STAPLER VISISTAT 35W (STAPLE) ×1 IMPLANT
SURGIFLO W/THROMBIN 8M KIT (HEMOSTASIS) IMPLANT
SUT MNCRL AB 3-0 PS2 27 (SUTURE) ×2 IMPLANT
SUT MNCRL+ AB 3-0 CT1 36 (SUTURE) IMPLANT
SUT MONOCRYL AB 3-0 CT1 36IN (SUTURE) ×1
SUT SILK 0 TIES 10X30 (SUTURE) IMPLANT
SUT SILK 2 0 TIES 10X30 (SUTURE) ×1 IMPLANT
SUT SILK 3 0 TIES 17X18 (SUTURE) ×1
SUT SILK 3 0SH CR/8 30 (SUTURE) IMPLANT
SUT SILK 3-0 18XBRD TIE BLK (SUTURE) IMPLANT
SUT VIC AB 1 CT1 18XCR BRD 8 (SUTURE) ×2 IMPLANT
SUT VIC AB 1 CT1 8-18 (SUTURE) ×3
SUT VIC AB 2-0 CT1 18 (SUTURE) ×2 IMPLANT
SYR BULB IRRIG 60ML STRL (SYRINGE) ×1 IMPLANT
SYR CONTROL 10ML LL (SYRINGE) IMPLANT
TAPE CLOTH 4X10 WHT NS (GAUZE/BANDAGES/DRESSINGS) ×1 IMPLANT
TIP CONICAL INSTAFILL (ORTHOPEDIC DISPOSABLE SUPPLIES) IMPLANT
TOWEL GREEN STERILE (TOWEL DISPOSABLE) ×2 IMPLANT
TOWEL GREEN STERILE FF (TOWEL DISPOSABLE) ×1 IMPLANT
WATER STERILE IRR 1000ML POUR (IV SOLUTION) ×1 IMPLANT
YANKAUER SUCT BULB TIP NO VENT (SUCTIONS) IMPLANT

## 2022-08-21 NOTE — Anesthesia Postprocedure Evaluation (Signed)
Anesthesia Post Note  Patient: Deanna Potter  Procedure(s) Performed: OBLIQUE LUMBAR INTERBODY FUSION 2 LEVEL (OLIF L3-5) ABDOMINAL EXPOSURE     Patient location during evaluation: PACU Anesthesia Type: General Level of consciousness: awake and alert Pain management: pain level controlled Vital Signs Assessment: post-procedure vital signs reviewed and stable Respiratory status: spontaneous breathing, nonlabored ventilation and respiratory function stable Cardiovascular status: blood pressure returned to baseline Postop Assessment: no apparent nausea or vomiting Anesthetic complications: no   No notable events documented.  Last Vitals:  Vitals:   08/21/22 1315 08/21/22 1338  BP: 123/63 107/67  Pulse: 70 66  Resp: 17 17  Temp:  36.6 C  SpO2: 95% 100%    Last Pain:  Vitals:   08/21/22 1300  TempSrc:   PainSc: Asleep                 Marthenia Rolling

## 2022-08-21 NOTE — OR Nursing (Addendum)
Dr. Nyoka Cowden (Radiologist) called to inform Dr. Rolena Infante that he sees nothing retained.

## 2022-08-21 NOTE — Transfer of Care (Signed)
Immediate Anesthesia Transfer of Care Note  Patient: Deanna Potter  Procedure(s) Performed: OBLIQUE LUMBAR INTERBODY FUSION 2 LEVEL (OLIF L3-5) ABDOMINAL EXPOSURE  Patient Location: PACU  Anesthesia Type:General  Level of Consciousness: awake and patient cooperative  Airway & Oxygen Therapy: Patient Spontanous Breathing and Patient connected to face mask oxygen  Post-op Assessment: Report given to RN, Post -op Vital signs reviewed and stable and Patient moving all extremities X 4  Post vital signs: Reviewed and stable  Last Vitals:  Vitals Value Taken Time  BP 112/61 08/21/22 1201  Temp    Pulse 72 08/21/22 1203  Resp 25 08/21/22 1203  SpO2 99 % 08/21/22 1203  Vitals shown include unvalidated device data.  Last Pain:  Vitals:   08/21/22 0652  TempSrc:   PainSc: 5       Patients Stated Pain Goal: 3 (37/62/83 1517)  Complications: No notable events documented.

## 2022-08-21 NOTE — Brief Op Note (Signed)
08/21/2022  11:59 AM  PATIENT:  Deanna Potter  71 y.o. 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  PROCEDURE:  Procedure(s): OBLIQUE LUMBAR INTERBODY FUSION 2 LEVEL (OLIF L3-5) (N/A) ABDOMINAL EXPOSURE (N/A)  SURGEON:  Surgeon(s) and Role: Panel 1:    Melina Schools, MD - Primary Panel 2:    Marty Heck, MD - Primary  PHYSICIAN ASSISTANT:   ASSISTANTS: none   ANESTHESIA:   general  EBL:  100 mL   BLOOD ADMINISTERED:none  DRAINS: none   LOCAL MEDICATIONS USED:  MARCAINE     SPECIMEN:  No Specimen  DISPOSITION OF SPECIMEN:  N/A  COUNTS:  YES  TOURNIQUET:  * No tourniquets in log *  DICTATION: .Dragon Dictation  PLAN OF CARE: Admit to inpatient   PATIENT DISPOSITION:  PACU - hemodynamically stable.

## 2022-08-21 NOTE — H&P (Signed)
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  The various methods of treatment have been discussed with the patient and family. After consideration of risks, benefits and other options for treatment, the patient has consented to  Procedure(s): OBLIQUE LUMBAR INTERBODY FUSION 2 LEVEL (OLIF L3-5) (N/A) ABDOMINAL EXPOSURE (N/A) as a surgical intervention.  The patient's history has been reviewed, patient examined, no change in status, stable for surgery.  I have reviewed the patient's chart and labs.  Questions were answered to the patient's satisfaction.    OLIF L3-L4 and L4-L5  Marty Heck  Patient name: Deanna Potter  MRN: [id number]        DOB: [birth year]          Sex: female   REASON FOR CONSULT: Evaluate for L3-L5 OLIF   HPI: Deanna Potter is a 71 y.o. female, with history of breast cancer, hypertension, chronic back pain that presents for evaluation of abdominal exposure for L3-L5 OLIF.  Patient has been followed by Dr. Rolena Infante.  She describes years of back pain.  In review of his notes, Dr. 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  He has recommended an L3-L4 and L4-L5 OLIF to stabilize the spondylolisthesis and asked vascular surgery to assist with abdominal exposure.  Previous abdominal surgical history includes tubal ligation.       Past Medical History:  Diagnosis Date   Anxiety     Cancer ([place]) 03/21/2020    Breast Cancer   Cardiomegaly 03/13/2019   Glaucoma     HTN (hypertension)     Lumbar spine pain     OSA on CPAP     Wears hearing aid             Past Surgical History:  Procedure Laterality Date   BREAST LUMPECTOMY WITH RADIOACTIVE SEED LOCALIZATION Left 07/19/2020    Procedure: LEFT BREAST LUMPECTOMY WITH RADIOACTIVE SEED LOCALIZATION;   Surgeon: Jovita Kussmaul, MD;  Location: [place];  Service: General;  Laterality: Left;   BREAST REDUCTION SURGERY Bilateral 07/19/2020    Procedure: BILATERAL MAMMARY REDUCTION  (BREAST) ONCO PLASTIC FOR LEFT BREAST CANCER;  Surgeon: Wallace Going, DO;  Location: [facility];  Service: Plastics;  Laterality: Bilateral;   EYE SURGERY Bilateral     PARTIAL HYSTERECTOMY               Family History  Problem Relation Age of Onset   Stomach cancer Brother        SOCIAL HISTORY: Social History         Socioeconomic History   Marital status: Widowed      Spouse name: Not on file   Number of children: 2   Years of education: Not on file   Highest education level: Not on file  Occupational History   Not on file  Tobacco Use   Smoking status: Never   Smokeless tobacco: Never  Vaping Use   Vaping Use: Never used  Substance and Sexual Activity   Alcohol use: Not Currently   Drug use: Never   Sexual activity: Not on file  Other Topics Concern   Not on file  Social History Narrative   Not on file    Social Determinants of Health    Financial Resource Strain: Not on file  Food Insecurity:  Not on file  Transportation Needs: Not on file  Physical Activity: Not on file  Stress: Not on file  Social Connections: Not on file  Intimate Partner Violence: Not on file      No Known Allergies         Current Outpatient Medications  Medication Sig Dispense Refill   Accu-Chek Softclix Lancets lancets Use to check blood sugar once daily. 100 each 2   amLODipine (NORVASC) 10 MG tablet Take 1 tablet (10 mg total) by mouth daily. 30 tablet 4   Blood Glucose Monitoring Suppl (ACCU-CHEK GUIDE) w/Device KIT Use to check blood sugar once daily. 1 kit 0   escitalopram (LEXAPRO) 10 MG tablet Take 1 tablet by mouth every day 30 tablet 0   gabapentin (NEURONTIN) 100 MG capsule Take 100 mg by mouth 3 (three) times daily.       glucose blood (ACCU-CHEK GUIDE) test strip Use to check blood sugar once  daily. 100 each 2   latanoprost (XALATAN) 0.005 % ophthalmic solution Place 1 drop into both eyes 2 (two) times daily.       metoprolol succinate (TOPROL-XL) 50 MG 24 hr tablet Take 1 tablet by mouth every day with a meal 30 tablet 11   rosuvastatin (CRESTOR) 5 MG tablet Take 1 tablet by mouth every day 30 tablet 11   Sodium Sulfate-Mag Sulfate-KCl (SUTAB) [phone] MG TABS See admin instructions.       solifenacin (VESICARE) 5 MG tablet Take 1 tablet (5 mg total) by mouth daily. 30 tablet 1   traZODone (DESYREL) 100 MG tablet Take 1 to 2 tablets by mouth at bedtime for sleep 60 tablet 0    No current facility-administered medications for this visit.      REVIEW OF SYSTEMS:  [X]  denotes positive finding, [ ]  denotes negative finding Cardiac   Comments:  Chest pain or chest pressure:      Shortness of breath upon exertion:      Short of breath when lying flat:      Irregular heart rhythm:             Vascular      Pain in calf, thigh, or hip brought on by ambulation:      Pain in feet at night that wakes you up from your sleep:       Blood clot in your veins:      Leg swelling:              Pulmonary      Oxygen at home:      Productive cough:       Wheezing:              Neurologic      Sudden weakness in arms or legs:       Sudden numbness in arms or legs:       Sudden onset of difficulty speaking or slurred speech:      Temporary loss of vision in one eye:       Problems with dizziness:              Gastrointestinal      Blood in stool:       Vomited blood:              Genitourinary      Burning when urinating:       Blood in urine:             Psychiatric  Major depression:              Hematologic      Bleeding problems:      Problems with blood clotting too easily:             Skin      Rashes or ulcers:             Constitutional      Fever or chills:          PHYSICAL EXAM:    Vitals:    08/01/22 1443  BP: 139/66  Pulse: (!) 56  Resp: 14   Temp: (!) 97.4 F (36.3 C)  TempSrc: Temporal  SpO2: 95%  Weight: 203 lb (92.1 kg)  Height: 5' (1.524 m)      GENERAL: The patient is a well-nourished female, in no acute distress. The vital signs are documented above. CARDIAC: There is a regular rate and rhythm.  VASCULAR:  Palpable femoral pulses bilaterally Palpable DP pulses bilaterally PULMONARY: No respiratory distress. ABDOMEN: Soft and non-tender. MUSCULOSKELETAL: There are no major deformities or cyanosis. NEUROLOGIC: No focal weakness or paresthesias are detected. SKIN: There are no ulcers or rashes noted. PSYCHIATRIC: The patient has a normal affect.   DATA:  MRI reviewed        Assessment/Plan:   71 year old female with chronic lower back pain that presents for evaluation of oblique spine exposure at the L3-L4 and L4-L5 disc space for OLIF.  Discussed after review of her MRI, I think she would be a good candidate for oblique approach.  Discussed her being in the right lateral position and then making an incision over her left oblique muscles at the L3-L4 and L4-L5 disc level.  Discussed entering the retroperitoneum and mobilized peritoneum and left ureter to the midline and then mobilizing left psoas away from the disc space as well as possibly mobilizing the vessels to get to the disc space.  Discussed risk of injury to the above structures.  All questions answered.  Look forward to assisting Dr. Rolena Infante.     Marty Heck, MD [facility] Office: [phone]

## 2022-08-21 NOTE — Anesthesia Procedure Notes (Signed)
Arterial Line Insertion Start/End9/25/2023 7:55 AM, 08/21/2022 7:58 AM Performed by: Brennan Bailey, MD, anesthesiologist  Patient location: OR. Preanesthetic checklist: patient identified, IV checked, site marked, risks and benefits discussed, surgical consent, monitors and equipment checked, pre-op evaluation, timeout performed and anesthesia consent Patient sedated Left, radial was placed Catheter size: 20 G Hand hygiene performed  and maximum sterile barriers used   Attempts: 1 Procedure performed using ultrasound guided technique. Ultrasound Notes:anatomy identified, needle tip was noted to be adjacent to the nerve/plexus identified, no ultrasound evidence of intravascular and/or intraneural injection and image(s) printed for medical record Following insertion, dressing applied and Biopatch. Post procedure assessment: normal and unchanged  Post procedure complications: unsuccessful attempts and second provider assisted. Patient tolerated the procedure well with no immediate complications. Additional procedure comments: Initial attempts x2 by CRNA unsuccessful to left radial. Ultrasound used for placement by myself more proximally on first attempt. Daiva Huge, MD.

## 2022-08-21 NOTE — Op Note (Signed)
Date: August 21, 2022  Preoperative diagnosis: Chronic lower back pain  Postoperative diagnosis: Same  Procedure: Oblique retroperitoneal spine exposure at the L3-L4 and L4-L5 disc space for L3-L4 OLIF and L4-L5 OLIF  Surgeon: Dr. Marty Heck, MD  Co-surgeon: Dr. Melina Schools, MD  Indications: 71 year old female with chronic severe lower back, buttock, and neuropathic pain.  She has lumbar spinal stenosis with neurogenic claudication and vascular surgery was asked to assist with spine exposure at the L3-L4 and L4-L5 disc space for OLIF.  She presents today after risks benefits discussed.  Findings: Patient was placed in the right lateral position and all pressure points padded.  The L3-L4 and L4-5 disc space were marked over the left oblique muscles with a fluoroscopic C arm.  Incision was made 2 fingerbreadths off the left iliac crest and performed a muscle-sparing technique between the oblique muscles to get into the retroperitoneum after the peritoneum and left ureter was mobilized.  The left psoas muscle was mobilized lateral to the disc space and ultimately the L4-L5 and L3-L4 disc space were exposed and a fixed retractor was placed at each level.  Anesthesia: General  Details: Patient was taken to the operating room after informed consent was obtained.  General endotracheal anesthesia was induced and then she was rolled in the right lateral position and all pressure points padded.  A fluoroscopic C-arm was used in lateral position to mark the L3-L4 and L4-5 disc space over the left oblique muscles.  We planned an incision 2 fingerbreadths off the left iliac crest.  The abdominal wall, left flank, and back were prepped and draped in standard sterile fashion.  Timeout was performed.  Antibiotics were given.  We made our incision 2 fingerbreadths off the left iliac crest and dissected through subcutaneous tissue with Bovie cautery.  Cerebellar retractors were used for added  visualization.  The left external oblique fascia was opened with Bovie cautery and then a muscle-sparing technique was used to divide the layers of the oblique muscles.  Peritoneum and left ureter were mobilized down until we visualized the left psoas muscle.  Hand-held Wiley retractors were used initially and went to the L4-5 disc space and the left psoas muscle was mobilized lateral to the disc space.  I also moved up one level to the L3-L4 level and mobilized the left psoas lateral at the L3-L4 disc space as well with good working room.  I did ligate 1 iliolumbar branch between clips and this was divided.  I placed a fixed NuVasive retractor initially at L4-L5 with a 160 reverse lip retractor placed to pull the vessels down and two 160 reverse lip retractors to pull the left psoas muscle lateral to the disc space.  A needle was placed in the disc space and we confirmed that we were at the correct L4-L5 level.  Case was turned over to Dr. Rolena Infante.  I was called back to the room after the implant was completed at L4-L5.  I scrubbed back in to the case.  The NuVasive retractor was then moved up one level to the L3-L4 disc space.  We made sure that we pulled the left psoas muscle lateral to the disc space.  Case was turned over to Dr. Rolena Infante.  Complication: None  Condition: Stable  Marty Heck, MD [facility] Office: [place]

## 2022-08-21 NOTE — Anesthesia Procedure Notes (Signed)
Procedure Name: Intubation Date/Time: 08/21/2022 7:47 AM  Performed by: Lowella Dell, CRNAPre-anesthesia Checklist: Patient identified, Emergency Drugs available, Suction available and Patient being monitored Patient Re-evaluated:Patient Re-evaluated prior to induction Oxygen Delivery Method: Circle System Utilized Preoxygenation: Pre-oxygenation with 100% oxygen Induction Type: IV induction Ventilation: Mask ventilation without difficulty and Oral airway inserted - appropriate to patient size Laryngoscope Size: Mac and 3 Grade View: Grade I Tube type: Oral Number of attempts: 1 Airway Equipment and Method: Stylet and Oral airway Placement Confirmation: ETT inserted through vocal cords under direct vision, positive ETCO2 and breath sounds checked- equal and bilateral Secured at: 22 cm Tube secured with: Tape Dental Injury: Teeth and Oropharynx as per pre-operative assessment

## 2022-08-21 NOTE — Anesthesia Procedure Notes (Signed)
Anesthesia Regional Block: TAP block   Pre-Anesthetic Checklist: , timeout performed,  Correct Patient, Correct Site, Correct Laterality,  Correct Procedure, Correct Position, site marked,  Risks and benefits discussed,  Pre-op evaluation,  At surgeon's request and post-op pain management  Laterality: Left  Prep: Maximum Sterile Barrier Precautions used, chloraprep       Needles:  Injection technique: Single-shot  Needle Type: Echogenic Stimulator Needle     Needle Length: 9cm  Needle Gauge: 21     Additional Needles:   Narrative:  Start time: 08/21/2022 7:13 AM End time: 08/21/2022 7:16 AM Injection made incrementally with aspirations every 5 mL. Anesthesiologist: Brennan Bailey, MD  Additional Notes: Risks, benefits, and alternative discussed. Patient gave consent for procedure. Patient prepped and draped in sterile fashion. Sedation administered, patient remains easily responsive to voice. Relevant anatomy identified with ultrasound guidance. Local anesthetic given in 5cc increments with no signs or symptoms of intravascular injection. No pain or paraesthesias with injection. Patient monitored throughout procedure with signs of LAST or immediate complications. Tolerated well. Ultrasound image placed in chart. Tawny Asal, MD

## 2022-08-21 NOTE — Op Note (Signed)
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 Monica Martinez  ANESTHESIA:   General  EBL: 326 ml   Complications: None  Implants: Globus expandable Rise-L cage.  7 to 14 mm 0 degree lordosis.  L4-5: Expanded to 11.5 mm.  L3-4: Expanded to 13 mm.  Graft: DBX mix/DBX  BRIEF HISTORY: Deanna Potter is a 71 y.o. female who presented to my office with ongoing severe back buttock and neuropathic leg pain.  Despite appropriate conservative management her quality of life continued to deteriorate.  Imaging confirmed spondylolisthesis L3-4 L4-5 with lateral recess and central stenosis causing nerve compression.  As result of the failure of conservative management we elected to move forward with surgery.  All appropriate risks, benefits, and alternatives to surgery were discussed and consent was obtained  PROCEDURE DETAILS: Patient was brought into the operating room and was properly positioned on the operating room table.  After induction with general anesthesia the patient was endotracheally intubated.  A timeout was taken to confirm all important data: including patient, procedure, and the level. Teds, SCD's were applied.  The Foley was also inserted by the nurse.  The patient was then turned into the lateral decubitus position left side up.  Axillary roll was positioned in the left arm was placed in the well arm holder.  She was secured directly to the table in the lateral decubitus position to ensure adequate visualization with fluoroscopy of the L3-4 and L4-5 levels.  Once she was properly positioned the abdomen  flanks and lumbar spine were prepped and draped in a standard fashion.  At this point Dr. Carlis Abbott performed a standard oblique lumbar peritoneal approach to the lumbar spine.  I did assist him by holding retractors.  Once the retracting system was properly positioned and the L4-5 level was exposed a needle was placed into the disc space and an x-ray confirmed as" appropriate level.  At this point Dr. Carlis Abbott scrubbed out and I proceeded with the discectomy.  An annulotomy was performed and I remove the bulk of the disc material with pituitary rongeurs.  Using a Cobb elevator I malleted across the disc space to the contralateral annulus to release it.  This was done with fluoroscopic guidance.  At this point I continued used curettes to remove the disc material.  I then worked my way posteriorly.  I then used a small angled curette to release the annulus from the posterior aspect of the L4 and L5 vertebral bodies.  I then used 2 mm Kerrison rongeurs to remove some of the posterior annulus and take down some of the posterior osteophyte.  I could now pass my nerve hook behind the vertebral body from the left L5 pedicle down to the right L5 pedicle.  I also used my lamina spreader to confirm parallel endplate distraction under live fluoroscopy.  At this point I was pleased with the overall discectomy and improvement in the spondylolisthesis.  The foraminal volume had also improved.  The cage was trialed and I obtained the expandable implant.  It was packed with DBX mix and then gently inserted to the disc space.  Once I had a satisfactory  position I expanded approximately 4 and half millimeters.  I then backfilled the cage with DBX mix.  Anterior to the cage I placed the DBX.  Imaging demonstrated satisfactory overall alignment of the cage and improvement in the posterior foraminal space.  At this point time Dr. Carlis Abbott scrubbed back into the case and reposition the retractors to expose the L3-4 level.  Once we had  visualized the appropriate level and marked and confirmed with fluoroscopy he scrubbed out.  The annulotomy was performed with a 10 blade scalpel and I used the same technique to remove all of the disc material and released the contralateral annulus that had used at L4-5.  I again used my fine nerve hook and curette to release the posterior annulus from the vertebral bodies of L3 and L4.  Once I confirmed I had adequately decompressed the foramen and the lateral recess directly I then rasped the endplates so I had bleeding subchondral bone.  I then trialed and placed the same 7-14 expandable 0 degree lordosis implant.  Implant was packed with DBX mix prior to implantation.  I then implanted it and expanded it to the appropriate height.  He had excellent overall purchase.  I then backfilled the cage with additional graft.  DBX was then placed anterior to the cage in order to facilitate the sentinel fusion.  With both levels now complete I irrigated the wound copiously normal saline and then gently removed the retractors after putting Floseal into the wound to aid in overall hemostasis.  Once all the retractors were removed I then closed the fascia of the external oblique with interrupted #1 Vicryl suture.  The deep subcutaneous tissue was then closed with a running 0 Vicryl suture followed by interrupted 2-0 Vicryl suture.  3-0 Monocryl Steri-Strips and a dry dressing were applied.  Prior to wound closure we did obtain a lateral x-ray which was read by the radiologist confirming there were no surgical instruments in the wound.  With the final count correct and needle sponge counts correct as well as the x-ray read by the radiologist the patient was ultimately extubated transfer the PACU without incident.  The end of the case all needle sponge counts were correct.  There were no adverse intraoperative events.  Patient will be admitted to the third floor and return to the OR tomorrow for posterior supplemental  pedicle screw fixation and possible decompression if her radicular leg pain has intensified.  Melina Schools, MD 08/21/2022 11:49 AM

## 2022-08-21 NOTE — H&P (Signed)
History:Deanna Potter is a very pleasant 71 year old woman with ongoing severe back buttock and neuropathic lower extremity pain. Patient's clinical exam is consistent with lumbar spinal stenosis with neurogenic claudication due to the pre-existing degenerative spondylolisthesis L3-4 and L4-5.  As result we have elected to move forward with a staged two-level anterior/posterior spinal fusion L3-5.  Past Medical History:  Diagnosis Date   Anxiety    Cancer ([place]) 03/21/2020   Breast Cancer   Cardiomegaly 03/13/2019   Diabetes mellitus without complication (HCC)    type 2   Glaucoma    HTN (hypertension)    Lumbar spine pain    OSA on CPAP    Wears hearing aid     No Known Allergies  No current facility-administered medications on file prior to encounter.   Current Outpatient Medications on File Prior to Encounter  Medication Sig Dispense Refill   amLODipine (NORVASC) 10 MG tablet Take 1 tablet (10 mg total) by mouth daily. 30 tablet 4   diphenhydrAMINE (BENADRYL) 25 MG tablet Take 25 mg by mouth at bedtime as needed for itching.     escitalopram (LEXAPRO) 10 MG tablet Take 1 tablet by mouth every day 30 tablet 0   ibuprofen (ADVIL) 200 MG tablet Take 400-800 mg by mouth every 6 (six) hours as needed for moderate pain.     latanoprost (XALATAN) 0.005 % ophthalmic solution Place 1 drop into both eyes at bedtime.     MAGNESIUM PO Take 1 tablet by mouth daily.     metoprolol succinate (TOPROL-XL) 50 MG 24 hr tablet Take 1 tablet by mouth every day with a meal 30 tablet 11   traZODone (DESYREL) 100 MG tablet Take 1 to 2 tablets by mouth at bedtime for sleep (Patient taking differently: Take 100 mg by mouth at bedtime.) 60 tablet 0   zinc gluconate 50 MG tablet Take 50 mg by mouth daily.     rosuvastatin (CRESTOR) 5 MG tablet Take 1 tablet by mouth every day (Patient not taking: Reported on 08/08/2022) 30 tablet 11   solifenacin (VESICARE) 5 MG tablet Take 1 tablet (5 mg total) by mouth  daily. (Patient not taking: Reported on 08/08/2022) 30 tablet 1    Physical Exam: Vitals:   08/21/22 0624  BP: 120/68  Pulse: (!) 58  Resp: 18  Temp: 98.8 F (37.1 C)  SpO2: 98%   Body mass index is 38.67 kg/m. Clinical exam: Deanna Potter is a pleasant individual, who appears younger than their stated age.  She is alert and orientated 3.  No shortness of breath, chest pain. Abdomen is soft and non-tender, negative loss of bowel and bladder control, no rebound tenderness.  Negative: skin lesions abrasions contusions  Peripheral pulses: 2+ dorsalis pedis/posterior tibial pulses bilaterally. Compartment soft and nontender.  Gait pattern: Normal  Assistive devices: None  Neuro: Intermittent dysesthesias into the left lower extremity. 5/5 motor strength in lower extremity. Negative Babinski test, no clonus, negative straight leg raise test. 1+ deep tendon reflexes at the knee absent at the Achilles  Musculoskeletal: Significant back pain with range of motion and deep palpation. Pain radiates into the paraspinal region. No SI joint pain.  Lumbar x-rays taken today in the office (AP/lateral/spot lateral) were reviewed: Demonstrate degenerative spondylolisthesis L4-5 and to a lesser degree L3-4. Loss of normal lumbar lordosis. No scoliosis  Lumbar MRI completed on 06/14/2022: Degenerative spondylolisthesis L3-4 and L4-5. Severe spinal stenosis with near ablation of the CSF space at L3-4 and to a  lesser degree L4-5. No fracture is seen.   A/P:Summary: Deanna Potter is a very pleasant 71 year old woman with ongoing severe back buttock and neuropathic lower extremity pain. Patient's clinical exam is consistent with lumbar spinal stenosis with neurogenic claudication due to the pre-existing degenerative spondylolisthesis L3-4 and L4-5. Despite injection therapy and physical therapy her quality of life is continued to deteriorate and she would like to move forward with surgery. At this point I think that is  very reasonable. The goal of surgery would be to reduce her pain and improve her quality of life. Recommendation is a two-level lumbar fusion L3-4 L4-5 followed by posterior supplemental pedicle screw fixation. I recommended an OLIF at L3-4 and L4-5 to stabilize the spondylolisthesis and indirectly decompress the spinal stenosis. The second a provided her neurogenic claudication symptoms improved we would supplement the interbody fixation with posterior pedicle screw fixation. OLIF/XLIF risks, benefits of surgery were reviewed with the patient. These include: infection, bleeding, death, stroke, paralysis, ongoing or worse pain, need for additional surgery, injury to the lumbar plexus resulting in hip flexor weakness and difficulty walking without assistive devices. Adjacent segment degenerative disease, need for additional surgery including fusing other levels, leak of spinal fluid, Nonunion, hardware failure, breakage, or mal-position. Deep venous thrombosis (DVT) requiring additional treatment such as filter, and/or medications. Injury to abdominal contents, loss in bowel and bladder control. Risks and benefits of spinal fusion: Infection, bleeding, death, stroke, paralysis, ongoing or worse pain, need for additional surgery, nonunion, leak of spinal fluid, adjacent segment degeneration requiring additional fusion surgery, Injury to abdominal vessels that can require anterior surgery to stop bleeding. Malposition of the cage and/or pedicle screws that could require additional surgery. Loss of bowel and bladder control. Postoperative hematoma causing neurologic compression that could require urgent or emergent re-operation.

## 2022-08-22 ENCOUNTER — Inpatient Hospital Stay (HOSPITAL_COMMUNITY)
Admission: RE | Disposition: A | Discharge status: Home Health | Payer: Self-pay | Source: Home / Self Care | Attending: Orthopedic Surgery

## 2022-08-22 ENCOUNTER — Inpatient Hospital Stay (HOSPITAL_COMMUNITY): Payer: Medicare Other

## 2022-08-22 ENCOUNTER — Inpatient Hospital Stay (HOSPITAL_COMMUNITY): Payer: Medicare Other | Admitting: Certified Registered Nurse Anesthetist

## 2022-08-22 ENCOUNTER — Encounter (HOSPITAL_COMMUNITY): Payer: Self-pay | Admitting: Orthopedic Surgery

## 2022-08-22 ENCOUNTER — Inpatient Hospital Stay (HOSPITAL_COMMUNITY): Admission: RE | Admit: 2022-08-22 | Payer: Medicare Other | Source: Home / Self Care | Admitting: Orthopedic Surgery

## 2022-08-22 DIAGNOSIS — G4733 Obstructive sleep apnea (adult) (pediatric): Secondary | ICD-10-CM

## 2022-08-22 DIAGNOSIS — I10 Essential (primary) hypertension: Secondary | ICD-10-CM

## 2022-08-22 DIAGNOSIS — Z9989 Dependence on other enabling machines and devices: Secondary | ICD-10-CM

## 2022-08-22 DIAGNOSIS — M4316 Spondylolisthesis, lumbar region: Secondary | ICD-10-CM

## 2022-08-22 DIAGNOSIS — M48061 Spinal stenosis, lumbar region without neurogenic claudication: Secondary | ICD-10-CM

## 2022-08-22 LAB — POCT I-STAT EG7
Acid-Base Excess: 0 mmol/L (ref 0.0–2.0)
Bicarbonate: 26.3 mmol/L (ref 20.0–28.0)
Calcium, Ion: 1.35 mmol/L (ref 1.15–1.40)
HCT: 35 % — ABNORMAL LOW (ref 36.0–46.0)
Hemoglobin: 11.9 g/dL — ABNORMAL LOW (ref 12.0–15.0)
O2 Saturation: 98 %
Potassium: 4.4 mmol/L (ref 3.5–5.1)
Sodium: 139 mmol/L (ref 135–145)
TCO2: 28 mmol/L (ref 22–32)
pCO2, Ven: 47.5 mmHg (ref 44–60)
pH, Ven: 7.352 (ref 7.25–7.43)
pO2, Ven: 106 mmHg — ABNORMAL HIGH (ref 32–45)

## 2022-08-22 LAB — GLUCOSE, CAPILLARY
Glucose-Capillary: 105 mg/dL — ABNORMAL HIGH (ref 70–99)
Glucose-Capillary: 112 mg/dL — ABNORMAL HIGH (ref 70–99)
Glucose-Capillary: 141 mg/dL — ABNORMAL HIGH (ref 70–99)
Glucose-Capillary: 129 mg/dL — ABNORMAL HIGH (ref 70–99)

## 2022-08-22 SURGERY — POSTERIOR LUMBAR FUSION 2 LEVEL
Anesthesia: General | Site: Spine Lumbar

## 2022-08-22 MED ORDER — LIDOCAINE 2% (20 MG/ML) 5 ML SYRINGE
INTRAMUSCULAR | Status: DC | PRN
  Administered 2022-08-22: 80 mg via INTRAVENOUS

## 2022-08-22 MED ORDER — ACETAMINOPHEN 325 MG PO TABS
ORAL_TABLET | ORAL | Status: DC | PRN
  Administered 2022-08-22: 1000 mg via ORAL

## 2022-08-22 MED ORDER — PHENOL 1.4 % MT LIQD: 1.0000 | OROMUCOSAL | Status: DC | PRN

## 2022-08-22 MED ORDER — CEFAZOLIN SODIUM-DEXTROSE 2-3 GM-%(50ML) IV SOLR
INTRAVENOUS | Status: DC | PRN
  Administered 2022-08-22: 2 g via INTRAVENOUS

## 2022-08-22 MED ORDER — ONDANSETRON HCL 4 MG PO TABS: 4.0000 mg | ORAL_TABLET | Freq: Four times a day (QID) | ORAL | Status: DC | PRN

## 2022-08-22 MED ORDER — CEFAZOLIN SODIUM-DEXTROSE 2-4 GM/100ML-% IV SOLN
INTRAVENOUS | Status: AC
  Filled 2022-08-22: qty 100

## 2022-08-22 MED ORDER — PROPOFOL 10 MG/ML IV BOLUS
INTRAVENOUS | Status: DC | PRN
  Administered 2022-08-22: 90 mg via INTRAVENOUS

## 2022-08-22 MED ORDER — SURGIFLO WITH THROMBIN (HEMOSTATIC MATRIX KIT) OPTIME
TOPICAL | Status: DC | PRN
  Administered 2022-08-22: 1 via TOPICAL

## 2022-08-22 MED ORDER — BUPIVACAINE-EPINEPHRINE 0.25% -1:200000 IJ SOLN
INTRAMUSCULAR | Status: DC | PRN
  Administered 2022-08-22: 10 mL

## 2022-08-22 MED ORDER — ACETAMINOPHEN 500 MG PO TABS
ORAL_TABLET | ORAL | Status: AC
  Administered 2022-08-23: 650 mg via ORAL
  Filled 2022-08-22: qty 2

## 2022-08-22 MED ORDER — LIDOCAINE 2% (20 MG/ML) 5 ML SYRINGE
INTRAMUSCULAR | Status: AC
  Filled 2022-08-22: qty 5

## 2022-08-22 MED ORDER — ONDANSETRON HCL 4 MG/2ML IJ SOLN
INTRAMUSCULAR | Status: AC
  Filled 2022-08-22: qty 2

## 2022-08-22 MED ORDER — ACETAMINOPHEN 325 MG PO TABS
650.0000 mg | ORAL_TABLET | ORAL | Status: DC | PRN
  Administered 2022-08-23 – 2022-08-24 (×2): 650 mg via ORAL
  Filled 2022-08-22 (×3): qty 2

## 2022-08-22 MED ORDER — PROPOFOL 500 MG/50ML IV EMUL
INTRAVENOUS | Status: DC | PRN
  Administered 2022-08-22: 15 mg via INTRAVENOUS
  Administered 2022-08-22: 50 ug/kg/min via INTRAVENOUS

## 2022-08-22 MED ORDER — CEFAZOLIN SODIUM-DEXTROSE 1-4 GM/50ML-% IV SOLN
1.0000 g | Freq: Three times a day (TID) | INTRAVENOUS | Status: AC
  Administered 2022-08-22 (×2): 1 g via INTRAVENOUS
  Filled 2022-08-22 (×2): qty 50

## 2022-08-22 MED ORDER — DEXAMETHASONE SODIUM PHOSPHATE 10 MG/ML IJ SOLN
INTRAMUSCULAR | Status: DC | PRN
  Administered 2022-08-22: 10 mg via INTRAVENOUS

## 2022-08-22 MED ORDER — INSULIN ASPART 100 UNIT/ML IJ SOLN: 0.0000 [IU] | Freq: Every day | INTRAMUSCULAR | Status: DC

## 2022-08-22 MED ORDER — INSULIN ASPART 100 UNIT/ML IJ SOLN
0.0000 [IU] | Freq: Three times a day (TID) | INTRAMUSCULAR | Status: DC
  Administered 2022-08-22: 2 [IU] via SUBCUTANEOUS

## 2022-08-22 MED ORDER — METHOCARBAMOL 500 MG PO TABS
500.0000 mg | ORAL_TABLET | Freq: Four times a day (QID) | ORAL | Status: DC | PRN
  Administered 2022-08-22 – 2022-08-23 (×3): 500 mg via ORAL
  Filled 2022-08-22 (×3): qty 1

## 2022-08-22 MED ORDER — MORPHINE SULFATE (PF) 2 MG/ML IV SOLN: 2.0000 mg | INTRAVENOUS | Status: DC | PRN

## 2022-08-22 MED ORDER — ACETAMINOPHEN 650 MG RE SUPP: 650.0000 mg | RECTAL | Status: DC | PRN

## 2022-08-22 MED ORDER — SODIUM CHLORIDE 0.9 % IV SOLN: INTRAVENOUS | Status: DC

## 2022-08-22 MED ORDER — DEXAMETHASONE SODIUM PHOSPHATE 10 MG/ML IJ SOLN
INTRAMUSCULAR | Status: AC
  Filled 2022-08-22: qty 1

## 2022-08-22 MED ORDER — ACETAMINOPHEN 500 MG PO TABS: 1000.0000 mg | ORAL_TABLET | Freq: Once | ORAL | Status: DC | PRN

## 2022-08-22 MED ORDER — 0.9 % SODIUM CHLORIDE (POUR BTL) OPTIME
TOPICAL | Status: DC | PRN
  Administered 2022-08-22 (×2): 1000 mL

## 2022-08-22 MED ORDER — LACTATED RINGERS IV SOLN: INTRAVENOUS | Status: DC | PRN

## 2022-08-22 MED ORDER — LACTATED RINGERS IV SOLN: INTRAVENOUS | Status: DC

## 2022-08-22 MED ORDER — ONDANSETRON HCL 4 MG/2ML IJ SOLN: 4.0000 mg | Freq: Four times a day (QID) | INTRAMUSCULAR | Status: DC | PRN

## 2022-08-22 MED ORDER — THROMBIN 20000 UNITS EX SOLR: CUTANEOUS | Status: DC | PRN

## 2022-08-22 MED ORDER — ROCURONIUM BROMIDE 10 MG/ML (PF) SYRINGE
PREFILLED_SYRINGE | INTRAVENOUS | Status: AC
  Filled 2022-08-22: qty 10

## 2022-08-22 MED ORDER — SODIUM CHLORIDE 0.9% FLUSH: 3.0000 mL | INTRAVENOUS | Status: DC | PRN

## 2022-08-22 MED ORDER — ACETAMINOPHEN 160 MG/5ML PO SOLN: 1000.0000 mg | Freq: Once | ORAL | Status: DC | PRN

## 2022-08-22 MED ORDER — METHOCARBAMOL 1000 MG/10ML IJ SOLN: 500.0000 mg | Freq: Four times a day (QID) | INTRAVENOUS | Status: DC | PRN

## 2022-08-22 MED ORDER — OXYCODONE HCL 5 MG/5ML PO SOLN: 5.0000 mg | Freq: Once | ORAL | Status: DC | PRN

## 2022-08-22 MED ORDER — BUPIVACAINE-EPINEPHRINE (PF) 0.25% -1:200000 IJ SOLN
INTRAMUSCULAR | Status: AC
  Filled 2022-08-22: qty 30

## 2022-08-22 MED ORDER — MENTHOL 3 MG MT LOZG: 1.0000 | LOZENGE | OROMUCOSAL | Status: DC | PRN

## 2022-08-22 MED ORDER — FENTANYL CITRATE (PF) 100 MCG/2ML IJ SOLN: 25.0000 ug | INTRAMUSCULAR | Status: DC | PRN

## 2022-08-22 MED ORDER — ONDANSETRON HCL 4 MG PO TABS: 4.0000 mg | ORAL_TABLET | Freq: Three times a day (TID) | ORAL | 0 refills | Status: AC | PRN

## 2022-08-22 MED ORDER — OXYCODONE HCL 5 MG PO TABS: 5.0000 mg | ORAL_TABLET | Freq: Once | ORAL | Status: DC | PRN

## 2022-08-22 MED ORDER — PHENYLEPHRINE HCL-NACL 20-0.9 MG/250ML-% IV SOLN
INTRAVENOUS | Status: DC | PRN
  Administered 2022-08-22: 50 ug/min via INTRAVENOUS

## 2022-08-22 MED ORDER — SODIUM CHLORIDE 0.9% FLUSH: 3.0000 mL | Freq: Two times a day (BID) | INTRAVENOUS | Status: DC

## 2022-08-22 MED ORDER — SUCCINYLCHOLINE CHLORIDE 200 MG/10ML IV SOSY
PREFILLED_SYRINGE | INTRAVENOUS | Status: DC | PRN
  Administered 2022-08-22: 160 mg via INTRAVENOUS

## 2022-08-22 MED ORDER — PROPOFOL 10 MG/ML IV BOLUS
INTRAVENOUS | Status: AC
  Filled 2022-08-22: qty 20

## 2022-08-22 MED ORDER — METHOCARBAMOL 500 MG PO TABS: 500.0000 mg | ORAL_TABLET | Freq: Three times a day (TID) | ORAL | 0 refills | Status: AC | PRN

## 2022-08-22 MED ORDER — ACETAMINOPHEN 10 MG/ML IV SOLN: 1000.0000 mg | Freq: Once | INTRAVENOUS | Status: DC | PRN

## 2022-08-22 MED ORDER — FENTANYL CITRATE (PF) 250 MCG/5ML IJ SOLN
INTRAMUSCULAR | Status: DC | PRN
  Administered 2022-08-22: 100 ug via INTRAVENOUS
  Administered 2022-08-22 (×3): 25 ug via INTRAVENOUS

## 2022-08-22 MED ORDER — ONDANSETRON HCL 4 MG/2ML IJ SOLN
INTRAMUSCULAR | Status: DC | PRN
  Administered 2022-08-22: 4 mg via INTRAVENOUS

## 2022-08-22 MED ORDER — THROMBIN 20000 UNITS EX SOLR
CUTANEOUS | Status: AC
  Filled 2022-08-22: qty 20000

## 2022-08-22 MED ORDER — OXYCODONE-ACETAMINOPHEN 10-325 MG PO TABS: 1.0000 | ORAL_TABLET | Freq: Four times a day (QID) | ORAL | 0 refills | Status: AC | PRN

## 2022-08-22 MED ORDER — FENTANYL CITRATE (PF) 250 MCG/5ML IJ SOLN
INTRAMUSCULAR | Status: AC
  Filled 2022-08-22: qty 5

## 2022-08-22 MED ORDER — SUCCINYLCHOLINE CHLORIDE 200 MG/10ML IV SOSY
PREFILLED_SYRINGE | INTRAVENOUS | Status: AC
  Filled 2022-08-22: qty 10

## 2022-08-22 MED FILL — Thrombin (Recombinant) For Soln 20000 Unit: CUTANEOUS | Qty: 1 | Status: AC

## 2022-08-22 SURGICAL SUPPLY — 71 items
BAG COUNTER SPONGE SURGICOUNT (BAG) ×1 IMPLANT
BUR EGG ELITE 4.0 (BURR) IMPLANT
BUR NEURO DRILL SOFT 3.0X3.8M (BURR) IMPLANT
CABLE BIPOLOR RESECTION CORD (MISCELLANEOUS) ×1 IMPLANT
CAP RELINE MOD TULIP RMM (Cap) IMPLANT
CLIP NEUROVISION LG (CLIP) IMPLANT
CLSR STERI-STRIP ANTIMIC 1/2X4 (GAUZE/BANDAGES/DRESSINGS) ×1 IMPLANT
COVER SURGICAL LIGHT HANDLE (MISCELLANEOUS) ×1 IMPLANT
DRAIN CHANNEL 15F RND FF W/TCR (WOUND CARE) IMPLANT
DRAPE C-ARM 42X72 X-RAY (DRAPES) ×1 IMPLANT
DRAPE C-ARMOR (DRAPES) ×1 IMPLANT
DRAPE POUCH INSTRU U-SHP 10X18 (DRAPES) ×1 IMPLANT
DRAPE SURG 17X23 STRL (DRAPES) ×1 IMPLANT
DRAPE U-SHAPE 47X51 STRL (DRAPES) ×1 IMPLANT
DRSG OPSITE POSTOP 4X8 (GAUZE/BANDAGES/DRESSINGS) ×1 IMPLANT
DURAPREP 26ML APPLICATOR (WOUND CARE) ×1 IMPLANT
ELECT BLADE 4.0 EZ CLEAN MEGAD (MISCELLANEOUS)
ELECT BLADE 6.5 EXT (BLADE) ×1 IMPLANT
ELECT CAUTERY BLADE 6.4 (BLADE) ×1 IMPLANT
ELECT PENCIL ROCKER SW 15FT (MISCELLANEOUS) ×1 IMPLANT
ELECT REM PT RETURN 9FT ADLT (ELECTROSURGICAL) ×1
ELECTRODE BLDE 4.0 EZ CLN MEGD (MISCELLANEOUS) IMPLANT
ELECTRODE REM PT RTRN 9FT ADLT (ELECTROSURGICAL) ×1 IMPLANT
GLOVE BIOGEL PI IND STRL 8.5 (GLOVE) ×1 IMPLANT
GLOVE SS BIOGEL STRL SZ 8.5 (GLOVE) ×1 IMPLANT
GOWN STRL REUS W/ TWL LRG LVL3 (GOWN DISPOSABLE) ×1 IMPLANT
GOWN STRL REUS W/TWL 2XL LVL3 (GOWN DISPOSABLE) ×2 IMPLANT
GOWN STRL REUS W/TWL LRG LVL3 (GOWN DISPOSABLE) ×1
KIT BASIN OR (CUSTOM PROCEDURE TRAY) ×1 IMPLANT
KIT POSITION SURG JACKSON T1 (MISCELLANEOUS) ×1 IMPLANT
KIT TURNOVER KIT B (KITS) ×1 IMPLANT
MODULE EMG NDL SSEP NVM5 (NEEDLE) IMPLANT
MODULE EMG NEEDLE SSEP NVM5 (NEEDLE) ×1 IMPLANT
MODULE NVM5 NEXT GEN EMG (NEEDLE) IMPLANT
NDL 22X1.5 STRL (OR ONLY) (MISCELLANEOUS) ×1 IMPLANT
NDL SPNL 18GX3.5 QUINCKE PK (NEEDLE) ×1 IMPLANT
NEEDLE 22X1.5 STRL (OR ONLY) (MISCELLANEOUS) ×1 IMPLANT
NEEDLE SPNL 18GX3.5 QUINCKE PK (NEEDLE) ×1 IMPLANT
NS IRRIG 1000ML POUR BTL (IV SOLUTION) ×1 IMPLANT
PACK LAMINECTOMY ORTHO (CUSTOM PROCEDURE TRAY) ×1 IMPLANT
PACK UNIVERSAL I (CUSTOM PROCEDURE TRAY) ×1 IMPLANT
PAD ARMBOARD 7.5X6 YLW CONV (MISCELLANEOUS) ×2 IMPLANT
PATTIES SURGICAL .5 X.5 (GAUZE/BANDAGES/DRESSINGS) IMPLANT
PATTIES SURGICAL .5 X1 (DISPOSABLE) ×1 IMPLANT
POSITIONER HEAD PRONE TRACH (MISCELLANEOUS) ×1 IMPLANT
PROBE BALL TIP NVM5 SNG USE (BALLOONS) IMPLANT
PUTTY BONE DBX 2.5 MIS (Bone Implant) IMPLANT
PUTTY BONE DBX 5CC MIX (Putty) IMPLANT
ROD RELINE COCR LORD 5.0X70MM (Rod) IMPLANT
SCREW LOCK RSS 4.5/5.0MM (Screw) IMPLANT
SHANK RELINE MOD 5.5X40 (Screw) IMPLANT
SHANK RELINE O MOD 5.5X45MM (Screw) IMPLANT
SPONGE SURGIFOAM ABS GEL 100 (HEMOSTASIS) ×1 IMPLANT
SPONGE T-LAP 4X18 ~~LOC~~+RFID (SPONGE) ×2 IMPLANT
SURGIFLO W/THROMBIN 8M KIT (HEMOSTASIS) IMPLANT
SUT BONE WAX W31G (SUTURE) ×1 IMPLANT
SUT MNCRL AB 3-0 PS2 18 (SUTURE) ×2 IMPLANT
SUT MNCRL+ AB 3-0 CT1 36 (SUTURE) IMPLANT
SUT MONOCRYL AB 3-0 CT1 36IN (SUTURE) ×1
SUT VIC AB 1 CT1 18XCR BRD 8 (SUTURE) ×1 IMPLANT
SUT VIC AB 1 CT1 8-18 (SUTURE) ×2
SUT VIC AB 1 CTX 36 (SUTURE)
SUT VIC AB 1 CTX36XBRD ANBCTR (SUTURE) IMPLANT
SUT VIC AB 2-0 CT1 18 (SUTURE) ×1 IMPLANT
SYR BULB IRRIG 60ML STRL (SYRINGE) ×1 IMPLANT
SYR CONTROL 10ML LL (SYRINGE) ×1 IMPLANT
TOWEL GREEN STERILE (TOWEL DISPOSABLE) ×1 IMPLANT
TOWEL GREEN STERILE FF (TOWEL DISPOSABLE) ×1 IMPLANT
TRAY FOLEY MTR SLVR 16FR STAT (SET/KITS/TRAYS/PACK) ×1 IMPLANT
WATER STERILE IRR 1000ML POUR (IV SOLUTION) ×1 IMPLANT
YANKAUER SUCT BULB TIP NO VENT (SUCTIONS) ×1 IMPLANT

## 2022-08-22 NOTE — Op Note (Signed)
OPERATIVE REPORT  DATE OF SURGERY: 08/22/2022  PATIENT NAME:  Deanna Potter MRN: [id number] DOB: October 28, 1951  PCP: Ladell Pier, MD  PRE-OPERATIVE DIAGNOSIS: Status post oblique lumbar interbody fusion L3-5.  History of degenerative spondylolisthesis L3-5 with neuropathic leg pain  POST-OPERATIVE DIAGNOSIS: Same  PROCEDURE:   Supplemental posterior fusion L3-5 with posterior lateral arthrodesis  SURGEON:  Melina Schools, MD  PHYSICIAN ASSISTANT: None  ANESTHESIA:   General  EBL: 272 ml   Complications: None  Implants: NuVasive cortical pedicle screws 5.5 x 40 mm length at L3 and L5.  5.5 x 45 mm length at L4.  70 mm length rods with appropriate locking caps.  Neuromonitoring: No adverse SSEP or free running EMG activity.  All pedicle screws were directly stimulated.  L4 and L3: No activity greater than 40 mA bilaterally.  L5 on the left: Positive activity at 24 mA, and on the right at 30 mA.  Graft: DBX mix plus local autograft  BRIEF HISTORY: Deanna Potter is a 71 y.o. female who underwent a oblique lumbar body fusion L3-5 yesterday and presents today for supplemental posterior lateral arthrodesis with instrumentation.  Patient's preoperative neuropathic leg pain had significantly improved and so we will move forward with supplemental fixation.  All appropriate risks, benefits, and alternatives to surgery were discussed with the patient and consent was obtained  PROCEDURE DETAILS: Patient was brought into the operating room and was properly positioned on the operating room table.  After induction with general anesthesia the patient was endotracheally intubated.  A timeout was taken to confirm all important data: including patient, procedure, and the level. Teds, SCD's were applied.   The patient was turned prone onto the [facility] spine frame and all bony prominences were well-padded.  The back was then prepped and draped in a standard fashion.  Using fluoroscopy identified the  L3 and L5 pedicles and marked out my incision.  The incision was then infiltrated with quarter percent Marcaine.  Midline incision was made and sharp dissection was carried out down to the deep fascia.  I incised the deep fascia and using Cobb elevator stripped the paraspinal muscles to expose the L3, L4, and L5 spinous process as well as the L3-4 and L4-5 facet complexes.  Once I had the bony anatomy exposed I then confirmed I was at the appropriate level with fluoroscopy.  Using a high-speed bur I identified the starting position on the right L3 pedicle which was at the 7 o'clock position.  I scored the cortical bone and then used my pedicle all and advanced it towards the 1 o'clock position on the pedicle.  I confirmed trajectory with AP and lateral fluoroscopy.  Once I had at the appropriate depth I then removed the all and sounded the caudal canal with a ball-tipped feeler.  I then tapped and then repalpated the hole with a ball-tipped feeler.  The pedicle screw was then placed and it had excellent fixation.  Then with the contralateral side this time starting at the 5 o'clock position and aiming towards the 11 o'clock position.  Using the same technique I advanced the bur and then the awl.  Trajectory was confirmed with fluoroscopy and then I palpated the hole with a ball-tipped feeler.  I then tapped repalpated with a ball-tipped feeler and then placed the appropriate size screw.  Using this exact same technique I placed my screws at L4 and L5.  All 6 screws had excellent purchase.  I then directly stimulated each of  the screws.  Both the L4 and L3 screws had no activity greater than 40 mA.  The left L5 screw had activity at 24 mA and the right at 30 mA both well within the acceptable range.  With all 6 pedicle screws in place and stimulated I then proceeded with the arthrodesis.  The wound was then copiously irrigated with normal saline and I removed the facet complexes.  This was done with double-action  Leksell rongeur as well as a high-speed bur.  The entire L4-5 and L3-4 facet complex was decorticated and I decorticated the spinous process to add to the local bone graft.  I then packed some of the additional bone into the facet and posterior lateral corner and then supplemented this with DBX mix.  The polyaxial heads were then applied and the appropriate size rod was placed and secured in position with a locking caps.  All 3 locking caps were then secured and torqued according manufacture standards.  With the 1 side done I did the same exact thing on the contralateral side.  Again saving the high-speed reamings and the facet complex for bone graft.  This was also supplemented with DBX allograft bone.  The rod and locking caps were then inserted and it was secured and torqued according manufacture standards.  The wound was then gently irrigated and hemostasis was obtained using bipolar electrocautery and Floseal.  Retractors were removed and the deep fascia was secured back to the spinous processes at L3, L4, and L5.  The deep fascia was closed with interrupted #1 Vicryl sutures.  The superficial fascia was irrigated again hemostasis was obtained with bipolar cautery and then this layer was closed with interrupted 2-0 Vicryl suture and finally a 3-0 Monocryl for the skin.  Steri-Strips and dry dressings were applied.  The end of the case all needle sponge counts were correct.  There were no adverse intraoperative events.  Melina Schools, MD 08/22/2022 10:27 AM

## 2022-08-22 NOTE — Progress Notes (Signed)
[facility]  Subjective  - 1 Hour post op posterior lumbar fusion   Objective 116/62 (!) 58 97.8 F (36.6 C) 11 94%  Intake/Output Summary (Last 24 hours) at 08/22/2022 1135 Last data filed at 08/22/2022 1026 Gross per 24 hour  Intake 2796 ml  Output 4400 ml  Net -1604 ml    Moving B LE, sensation grossly intact Left LQ abdominal dressing clean and dry Abdomin soft  Assessment/Planning: POD # 1 Anterior lumbar exposure for interbody L3-L5 fusion POD# 0 posterior fusion  Reported flatus prior to OR today Diet per Dr. Rolena Infante call us with any concerns.    Roxy Horseman 08/22/2022 11:35 AM --  Laboratory Lab Results: Recent Labs    08/22/22 0811  HGB 11.9*  HCT 35.0*   BMET Recent Labs    08/22/22 0811  NA 139  K 4.4    COAG No results found for: "INR", "PROTIME" No results found for: "PTT"

## 2022-08-22 NOTE — Transfer of Care (Signed)
Immediate Anesthesia Transfer of Care Note  Patient: Deanna Potter  Procedure(s) Performed: Lumbar Three-Four, Lumbar Four-Five Posterior Spinal Fusion (Spine Lumbar)  Patient Location: PACU  Anesthesia Type:General  Level of Consciousness: awake, alert  and oriented  Airway & Oxygen Therapy: Patient Spontanous Breathing and Patient connected to nasal cannula oxygen  Post-op Assessment: Report given to RN, Post -op Vital signs reviewed and stable, Patient moving all extremities X 4 and Patient able to stick tongue midline  Post vital signs: Reviewed  Last Vitals:  Vitals Value Taken Time  BP 105/50 08/22/22 1032  Temp 97.8   Pulse 65 08/22/22 1033  Resp 19 08/22/22 1033  SpO2 92 % 08/22/22 1033  Vitals shown include unvalidated device data.  Last Pain:  Vitals:   08/22/22 0616  TempSrc: Oral  PainSc:       Patients Stated Pain Goal: 3 (94/17/40 8144)  Complications: No notable events documented.

## 2022-08-22 NOTE — Anesthesia Procedure Notes (Signed)
Procedure Name: Intubation Date/Time: 08/22/2022 7:51 AM  Performed by: Maude Leriche, CRNAPre-anesthesia Checklist: Patient identified, Emergency Drugs available, Suction available and Patient being monitored Patient Re-evaluated:Patient Re-evaluated prior to induction Oxygen Delivery Method: Circle system utilized Preoxygenation: Pre-oxygenation with 100% oxygen Induction Type: IV induction Ventilation: Mask ventilation without difficulty Laryngoscope Size: Miller and 2 Grade View: Grade I Tube type: Oral Tube size: 7.0 mm Number of attempts: 1 Placement Confirmation: ETT inserted through vocal cords under direct vision, positive ETCO2 and breath sounds checked- equal and bilateral Secured at: 21 cm Tube secured with: Tape Dental Injury: Teeth and Oropharynx as per pre-operative assessment

## 2022-08-22 NOTE — Brief Op Note (Signed)
08/22/2022  10:35 AM  PATIENT:  Deanna Potter  71 y.o. 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

## 2022-08-22 NOTE — Discharge Instructions (Signed)

## 2022-08-22 NOTE — Progress Notes (Signed)
    Subjective: Procedure(s) (LRB): Posterior spinal fusion interbody L3-5, possible decompression (N/A) Day of Surgery  Patient reports pain as 3 on 0-10 scale.  Reports decreased leg pain reports incisional back pain   N/A void - foley in place Negative bowel movement Positive flatus Negative chest pain or shortness of breath  Objective: Vital signs in last 24 hours: Temp:  [97.8 F (36.6 C)-99 F (37.2 C)] 98.6 F (37 C) (09/26 0616) Pulse Rate:  [59-77] 62 (09/26 0616) Resp:  [16-25] 20 (09/26 0023) BP: (92-137)/(56-72) 131/66 (09/26 0616) SpO2:  [94 %-100 %] 97 % (09/26 0616) Arterial Line BP: (114-118)/(51-55) 118/55 (09/25 1300)  Intake/Output from previous day: 09/25 0701 - 09/26 0700 In: 3296 [P.O.:720; I.V.:2476; IV Piggyback:100] Out: 3700 [Urine:3600; Blood:100]  Labs: No results for input(s): "WBC", "RBC", "HCT", "PLT" in the last 72 hours. No results for input(s): "NA", "K", "CL", "CO2", "BUN", "CREATININE", "GLUCOSE", "CALCIUM" in the last 72 hours. No results for input(s): "LABPT", "INR" in the last 72 hours.  Physical Exam: Neurologically intact ABD soft Intact pulses distally Incision: dressing C/D/I and no drainage Compartment soft Body mass index is 38.67 kg/m.   Assessment/Plan: Patient stable  xrays n/a Patient reports ambulating with no neuropathic leg pain. Patient does have mild incisional pain but overall she states she is improved.  At this point since there is no significant neuropathic leg pain I do not see the need for a posterior decompression.  The indirect decompression provided by the anterior instrumentation has been adequate enough to resolve her neuropathic leg pain.  We will plan on moving forward with the supplemental posterior screw fixation and posterior lateral arthrodesis.  Patient and her daughter were in agreement with the plan.  Consent was obtained.  Melina Schools, MD [facility] [phone]

## 2022-08-22 NOTE — Anesthesia Preprocedure Evaluation (Signed)
Anesthesia Evaluation  Patient identified by MRN, date of birth, ID band Patient awake    Reviewed: Allergy & Precautions, NPO status , Patient's Chart, lab work & pertinent test results, reviewed documented beta blocker date and time   History of Anesthesia Complications Negative for: history of anesthetic complications  Airway Mallampati: II  TM Distance: >3 FB Neck ROM: Full    Dental  (+) Missing,    Pulmonary sleep apnea and Continuous Positive Airway Pressure Ventilation ,    breath sounds clear to auscultation       Cardiovascular hypertension, Pt. on medications and Pt. on home beta blockers (-) angina(-) Past MI  Rhythm:Regular  Nuclear stress test 07/12/22: EF 57%. 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  Reproductive/Obstetrics                             Anesthesia Physical Anesthesia Plan  ASA: 3  Anesthesia Plan: General   Post-op Pain Management: Tylenol PO (pre-op)*   Induction: Intravenous  PONV Risk Score and Plan: 3 and Ondansetron and Dexamethasone  Airway Management Planned: Oral ETT  Additional Equipment:   Intra-op Plan:   Post-operative Plan: Extubation in OR  Informed Consent: I have reviewed the patients History and Physical, chart, labs and discussed the procedure including the risks, benefits and alternatives for the proposed anesthesia with the patient or authorized representative who has indicated  his/her understanding and acceptance.     Dental advisory given  Plan Discussed with: CRNA  Anesthesia Plan Comments:         Anesthesia Quick Evaluation

## 2022-08-23 ENCOUNTER — Encounter (HOSPITAL_COMMUNITY): Payer: Self-pay | Admitting: Orthopedic Surgery

## 2022-08-23 LAB — GLUCOSE, CAPILLARY
Glucose-Capillary: 106 mg/dL — ABNORMAL HIGH (ref 70–99)
Glucose-Capillary: 102 mg/dL — ABNORMAL HIGH (ref 70–99)
Glucose-Capillary: 81 mg/dL (ref 70–99)

## 2022-08-23 MED ORDER — INSULIN ASPART 100 UNIT/ML IJ SOLN: 0.0000 [IU] | Freq: Three times a day (TID) | INTRAMUSCULAR | Status: DC

## 2022-08-23 MED ORDER — SORBITOL 70 % SOLN
30.0000 mL | Freq: Every day | Status: DC | PRN
  Administered 2022-08-23 – 2022-08-24 (×2): 30 mL via ORAL
  Filled 2022-08-23: qty 30

## 2022-08-23 MED FILL — Sodium Chloride IV Soln 0.9%: INTRAVENOUS | Qty: 1000 | Status: AC

## 2022-08-23 MED FILL — Heparin Sodium (Porcine) Inj 1000 Unit/ML: INTRAMUSCULAR | Qty: 30 | Status: AC

## 2022-08-23 NOTE — Anesthesia Postprocedure Evaluation (Signed)
Anesthesia Post Note  Patient: Deanna Potter  Procedure(s) Performed: Lumbar Three-Four, Lumbar Four-Five Posterior Spinal Fusion (Spine Lumbar)     Patient location during evaluation: PACU Anesthesia Type: General Level of consciousness: awake and alert Pain management: pain level controlled Vital Signs Assessment: post-procedure vital signs reviewed and stable Respiratory status: spontaneous breathing, nonlabored ventilation, respiratory function stable and patient connected to nasal cannula oxygen Cardiovascular status: blood pressure returned to baseline and stable Postop Assessment: no apparent nausea or vomiting Anesthetic complications: no   No notable events documented.  Last Vitals:  REVIEWED AND STABLE                Kamya Watling

## 2022-08-23 NOTE — TOC Transition Note (Signed)
Transition of Care [facility]) - CM/SW Discharge Note   Patient Details  Name: Deanna Potter MRN: [id number] Date of Birth: [birth year]  [facility] CM/SW Contact:  Pollie Friar, RN Phone Number: 08/23/2022, 9:51 AM   Clinical Narrative:    Pt requiring hospital bed for home post discharge. CM has sent referral to [facility]. They will contact the patient for delivery. Address in system is correct.  Pt has transportation home once discharged.      Barriers to Discharge: No Barriers Identified   Patient Goals and CMS Choice        Discharge Placement                       Discharge Plan and Services                                     Social Determinants of Health (SDOH) Interventions     Readmission Risk Interventions     No data to display

## 2022-08-23 NOTE — Progress Notes (Signed)
[facility]  Subjective  -no complaints.   Objective (!) 110/53 70 100 F (37.8 C) (Oral) 16 97%  Intake/Output Summary (Last 24 hours) at 08/23/2022 1028 Last data filed at 08/23/2022 0500 Gross per 24 hour  Intake 999.72 ml  Output 400 ml  Net 599.72 ml    Abdomen with appropriate postop incisional tenderness  Laboratory Lab Results: Recent Labs    08/22/22 0811  HGB 11.9*  HCT 35.0*   BMET Recent Labs    08/22/22 0811  NA 139  K 4.4    COAG No results found for: "INR", "PROTIME" No results found for: "PTT"  Assessment/Planning:  Postop day 2 status post abdominal exposure for L3-L4 and L4-L5 OLIF.  Looks good from a vascular surgery standpoint.  Tolerating p.o.  Passing gas.  Appropriate postop incisional tenderness.  Marty Heck 08/23/2022 10:28 AM --

## 2022-08-23 NOTE — Evaluation (Signed)
Physical Therapy Evaluation  Patient Details Name: Deanna Potter MRN: [id number] DOB: 06-04-1951 Today's Date: 08/23/2022  History of Present Illness  Pt is a 71 y/o female who presents s/p L3-L5 OLIF on 9/25 followed by L3-L5 posterior spinal fusion on 9/26. PMH signficant for breast CA, cardiomegaly, DM, glaucoma, HTN.   Clinical Impression   Pt admitted with above diagnosis. At the time of PT eval, pt was able to demonstrate transfers and ambulation with gross min guard assist and RW for support. Over the last 3 months, pt reports a decline in function and has not been able to go out to the grocery store, get up her stairs to bedroom or full bathroom. She has been managing with grocery delivery and sleeping on a mattress on the floor of her main level. Recommend a hospital bed until she can manage the flight of stairs to her bedroom. Pt was educated on precautions, brace application/wearing schedule, appropriate activity progression, and car transfer. Pt currently with functional limitations due to the deficits listed below (see PT Problem List). Acutely, pt will benefit from skilled PT to increase their independence and safety with mobility to allow discharge to the venue listed below.       Recommendations for follow up therapy are one component of a multi-disciplinary discharge planning process, led by the attending physician.  Recommendations may be updated based on patient status, additional functional criteria and insurance authorization.  Follow Up Recommendations Home health PT      Assistance Recommended at Discharge Intermittent Supervision/Assistance  Patient can return home with the following  A little help with walking and/or transfers;A little help with bathing/dressing/bathroom;Assistance with cooking/housework;Assist for transportation;Help with stairs or ramp for entrance    Equipment Recommendations Rolling walker (2 wheels);Hospital bed;BSC/3in1  Recommendations for  Other Services       Functional Status Assessment Patient has had a recent decline in their functional status and demonstrates the ability to make significant improvements in function in a reasonable and predictable amount of time.     Precautions / Restrictions Precautions Precautions: Fall;Back Precaution Booklet Issued: Yes (comment) Precaution Comments: Reviewed handout and pt was cued for precautions during functional mobility. Required Braces or Orthoses: Spinal Brace Spinal Brace: Lumbar corset;Applied in sitting position Restrictions Weight Bearing Restrictions: No      Mobility  Bed Mobility               General bed mobility comments: Pt was received sitting up EOB.    Transfers Overall transfer level: Needs assistance Equipment used: Rolling walker (2 wheels) Transfers: Sit to/from Stand Sit to Stand: Min guard           General transfer comment: VC's for hand placement on seated surface for safety. No assist required but hands on guarding provided for safety.    Ambulation/Gait Ambulation/Gait assistance: Min guard Gait Distance (Feet): 150 Feet Assistive device: Rolling walker (2 wheels) Gait Pattern/deviations: Step-through pattern, Decreased stride length, Trunk flexed Gait velocity: Decreased Gait velocity interpretation: <1.31 ft/sec, indicative of household ambulator   General Gait Details: VC's for improved posture, forward gaze, and maintenance of precautions throughout gait training. Pt initially reporting lightheadedness and states it improved as mobility progressed. Pt reports 7/10 pain at end of gait training.  Stairs            Wheelchair Mobility    Modified Rankin (Stroke Patients Only)       Balance Overall balance assessment: Needs assistance Sitting-balance support: No upper extremity supported,  Feet supported Sitting balance-Leahy Scale: Fair     Standing balance support: Bilateral upper extremity supported, During  functional activity, Reliant on assistive device for balance Standing balance-Leahy Scale: Poor                               Pertinent Vitals/Pain Pain Assessment Pain Assessment: 0-10 Pain Score: 7  Pain Location: Incision site/back Pain Descriptors / Indicators: Operative site guarding, Sore Pain Intervention(s): Limited activity within patient's tolerance, Monitored during session, Repositioned    Home Living Family/patient expects to be discharged to:: Private residence Living Arrangements: Alone Available Help at Discharge: Family;Available 24 hours/day (temporarily) Type of Home: House Home Access: Level entry     Alternate Level Stairs-Number of Steps: flight Home Layout: Two level;Bed/bath upstairs;1/2 bath on main level Home Equipment: None      Prior Function Prior Level of Function : Needs assist             Mobility Comments: Pt was not utilizing an AD however was not able to negotiate stairs or go out in the community without assist. ADLs Comments: Reports she has been sleeping downstairs for ~3 months as she has not been able to access her upstairs bedroom/bathroom, sponge bathes in half bath, orders her groceries     Hand Dominance   Dominant Hand: Right    Extremity/Trunk Assessment   Upper Extremity Assessment Upper Extremity Assessment: Overall WFL for tasks assessed    Lower Extremity Assessment Lower Extremity Assessment: Defer to PT evaluation    Cervical / Trunk Assessment Cervical / Trunk Assessment: Back Surgery  Communication   Communication: HOH  Cognition Arousal/Alertness: Awake/alert Behavior During Therapy: WFL for tasks assessed/performed Overall Cognitive Status: Within Functional Limits for tasks assessed                                          General Comments      Exercises     Assessment/Plan    PT Assessment Patient needs continued PT services  PT Problem List Decreased  strength;Decreased activity tolerance;Decreased balance;Decreased mobility;Decreased knowledge of use of DME;Decreased safety awareness;Decreased knowledge of precautions;Pain       PT Treatment Interventions DME instruction;Gait training;Functional mobility training;Stair training;Therapeutic activities;Therapeutic exercise;Balance training;Patient/family education    PT Goals (Current goals can be found in the Care Plan section)  Acute Rehab PT Goals Patient Stated Goal: Be able to go up her stairs and sleep in her bedroom PT Goal Formulation: With patient Time For Goal Achievement: 08/30/22 Potential to Achieve Goals: Good    Frequency Min 5X/week     Co-evaluation               AM-PAC PT "6 Clicks" Mobility  Outcome Measure Help needed turning from your back to your side while in a flat bed without using bedrails?: A Little Help needed moving from lying on your back to sitting on the side of a flat bed without using bedrails?: A Little Help needed moving to and from a bed to a chair (including a wheelchair)?: A Little Help needed standing up from a chair using your arms (e.g., wheelchair or bedside chair)?: A Little Help needed to walk in hospital room?: A Little Help needed climbing 3-5 steps with a railing? : A Little 6 Click Score: 18    End of Session  Equipment Utilized During Treatment: Gait belt;Back brace Activity Tolerance: Patient tolerated treatment well Patient left: in bed;with call bell/phone within reach;Other (comment) (EOB awaiting OT) Nurse Communication: Mobility status PT Visit Diagnosis: Unsteadiness on feet (R26.81);Pain Pain - part of body:  (back)    Time: 8453-6468 PT Time Calculation (min) (ACUTE ONLY): 27 min   Charges:   PT Evaluation $PT Eval Low Complexity: 1 Low PT Treatments $Gait Training: 8-22 mins        Rolinda Roan, PT, DPT Acute Rehabilitation Services Secure Chat Preferred Office: [phone]   Thelma Comp 08/23/2022, 12:47 PM

## 2022-08-23 NOTE — Progress Notes (Cosign Needed)
Length of Need 6 Months  The above medical condition requires: Patient requires the ability to reposition frequently  Head must be elevated greater than: 30 degrees  Bed type Semi-electric   Due to patients back issues and recent surgery she will need to be able to reposition frequently.

## 2022-08-23 NOTE — Progress Notes (Signed)
    Subjective: Procedure(s) (LRB): Lumbar Three-Four, Lumbar Four-Five Posterior Spinal Fusion (N/A) 1 Day Post-Op  Patient reports pain as 2 on 0-10 scale.  Reports decreased leg pain reports incisional back pain   Positive void Negative bowel movement Positive flatus Negative chest pain or shortness of breath  Objective: Vital signs in last 24 hours: Temp:  [98.6 F (37 C)-100.7 F (38.2 C)] 98.7 F (37.1 C) (09/27 1219) Pulse Rate:  [56-75] 66 (09/27 1219) Resp:  [16-20] 16 (09/27 1219) BP: (110-141)/(53-88) 116/69 (09/27 1219) SpO2:  [92 %-100 %] 92 % (09/27 1219)  Intake/Output from previous day: 09/26 0701 - 09/27 0700 In: 1899.7 [P.O.:960; I.V.:900; IV Piggyback:39.7] Out: 1500 [Urine:1250; Blood:250]  Labs: Recent Labs    08/22/22 0811  HCT 35.0*   Recent Labs    08/22/22 0811  NA 139  K 4.4   No results for input(s): "LABPT", "INR" in the last 72 hours.  Physical Exam: Neurologically intact ABD soft Intact pulses distally Incision: dressing C/D/I and no drainage Compartment soft Body mass index is 38.67 kg/m.   Assessment/Plan: Patient stable  xrays n/a Continue mobilization with physical therapy Continue care  Advance diet Up with therapy Overall doing well.  Plan on d/c to home tomorrow.    Melina Schools, MD [facility] [phone]

## 2022-08-23 NOTE — Evaluation (Signed)
Occupational Therapy Evaluation Patient Details Name: Deanna Potter MRN: [id number] DOB: 1951-08-23 Today's Date: 08/23/2022   History of Present Illness Pt is a 71 y/o female who presents s/p L3-L5 OLIF on 9/25 followed by L3-L5 posterior spinal fusion on 9/26. PMH signficant for breast CA, cardiomegaly, DM, glaucoma, HTN.   Clinical Impression   Pt lives alone and although she was walking without a device, she was not able to manage stairs and was sleeping on the floor of the first level of her home and using her half bath. She was able to manage her ADLs independently. Pt presents with post op back pain, impaired standing balance and generalized weakness. She requires min guard assist with RW for OOB, mod assist to return to supine and set up to max assist for ADLs. Began educating pt in back precautions, compensatory strategies for ADLs and IADLs to avoid. Will follow acutely.      Recommendations for follow up therapy are one component of a multi-disciplinary discharge planning process, led by the attending physician.  Recommendations may be updated based on patient status, additional functional criteria and insurance authorization.   Follow Up Recommendations  No OT follow up    Assistance Recommended at Discharge Frequent or constant Supervision/Assistance  Patient can return home with the following A little help with walking and/or transfers;A lot of help with bathing/dressing/bathroom;Assistance with cooking/housework;Assist for transportation;Help with stairs or ramp for entrance    Functional Status Assessment  Patient has had a recent decline in their functional status and demonstrates the ability to make significant improvements in function in a reasonable and predictable amount of time.  Equipment Recommendations  BSC/3in1    Recommendations for Other Services       Precautions / Restrictions Precautions Precautions: Fall;Back Precaution Booklet Issued: Yes  (comment) Precaution Comments: Reviewed handout and pt was cued for precautions during functional mobility. Required Braces or Orthoses: Spinal Brace Spinal Brace: Lumbar corset;Applied in sitting position Restrictions Weight Bearing Restrictions: No      Mobility Bed Mobility Overal bed mobility: Needs Assistance Bed Mobility: Sit to Sidelying, Rolling Rolling: Min guard       Sit to sidelying: Mod assist General bed mobility comments: cues for log roll technique, assist for LEs into bed    Transfers Overall transfer level: Needs assistance Equipment used: Rolling walker (2 wheels) Transfers: Sit to/from Stand Sit to Stand: Min guard           General transfer comment: slow to rise from elevated bed      Balance Overall balance assessment: Needs assistance   Sitting balance-Leahy Scale: Fair       Standing balance-Leahy Scale: Poor                             ADL either performed or assessed with clinical judgement   ADL Overall ADL's : Needs assistance/impaired Eating/Feeding: Independent;Sitting   Grooming: Min guard;Standing;Wash/dry hands Grooming Details (indicate cue type and reason): educated in two cup method for oral care, use of washcloth on face to avoid bending over sink Upper Body Bathing: Minimal assistance;Sitting Upper Body Bathing Details (indicate cue type and reason): educated in use of long handled bath sponge for back Lower Body Bathing: Maximal assistance;Sit to/from stand   Upper Body Dressing : Set up;Sitting Upper Body Dressing Details (indicate cue type and reason): pt doffed brace correctly Lower Body Dressing: Maximal assistance;Sit to/from stand Lower Body Dressing Details (indicate cue  type and reason): cannot perform figure 4 Toilet Transfer: Min guard;Ambulation;Rolling walker (2 wheels) Toilet Transfer Details (indicate cue type and reason): instructed in benefits of 3 in 1 over toilet   Toileting - Clothing  Manipulation Details (indicate cue type and reason): instructed to avoid twisting with pericare, option to use tongs     Functional mobility during ADLs: Min guard;Rolling walker (2 wheels)       Vision Ability to See in Adequate Light: 0 Adequate Patient Visual Report: No change from baseline       Perception     Praxis      Pertinent Vitals/Pain Pain Assessment Pain Assessment: 0-10 Pain Score: 7  Pain Location: Incision site/back Pain Descriptors / Indicators: Operative site guarding, Sore Pain Intervention(s): Repositioned     Hand Dominance Right   Extremity/Trunk Assessment Upper Extremity Assessment Upper Extremity Assessment: Overall WFL for tasks assessed   Lower Extremity Assessment Lower Extremity Assessment: Defer to PT evaluation   Cervical / Trunk Assessment Cervical / Trunk Assessment: Back Surgery   Communication Communication Communication: HOH   Cognition Arousal/Alertness: Awake/alert Behavior During Therapy: WFL for tasks assessed/performed Overall Cognitive Status: Within Functional Limits for tasks assessed                                       General Comments       Exercises     Shoulder Instructions      Home Living Family/patient expects to be discharged to:: Private residence Living Arrangements: Alone Available Help at Discharge: Family;Available 24 hours/day (temporarily) Type of Home: House Home Access: Level entry     Home Layout: Two level;Bed/bath upstairs;1/2 bath on main level Alternate Level Stairs-Number of Steps: flight   Bathroom Shower/Tub: Teacher, early years/pre: Standard     Home Equipment: None          Prior Functioning/Environment Prior Level of Function : Needs assist             Mobility Comments: Pt was not utilizing an AD however was not able to negotiate stairs or go out in the community without assist. ADLs Comments: Reports she has been sleeping downstairs  for ~3 months as she has not been able to access her upstairs bedroom/bathroom, sponge bathes in half bath, orders her groceries        OT Problem List: Impaired balance (sitting and/or standing);Decreased strength;Decreased knowledge of use of DME or AE;Decreased knowledge of precautions;Pain      OT Treatment/Interventions: Self-care/ADL training;DME and/or AE instruction;Therapeutic activities;Patient/family education;Balance training    OT Goals(Current goals can be found in the care plan section) Acute Rehab OT Goals OT Goal Formulation: With patient Time For Goal Achievement: 09/06/22 Potential to Achieve Goals: Good ADL Goals Pt Will Perform Grooming: with supervision;standing Pt Will Perform Lower Body Bathing: with supervision;with adaptive equipment;sit to/from stand Pt Will Perform Lower Body Dressing: with supervision;with adaptive equipment;sit to/from stand Pt Will Transfer to Toilet: with supervision;ambulating;bedside commode Pt Will Perform Toileting - Clothing Manipulation and hygiene: with supervision;sit to/from stand Additional ADL Goal #1: Pt will perform bed mobility with supervision in preparation for ADLs. Additional ADL Goal #2: Pt will generalize back precautions in ADLs and mobility.  OT Frequency: Min 2X/week    Co-evaluation              AM-PAC OT "6 Clicks" Daily Activity  Outcome Measure Help from another person eating meals?: None Help from another person taking care of personal grooming?: A Little Help from another person toileting, which includes using toliet, bedpan, or urinal?: A Little Help from another person bathing (including washing, rinsing, drying)?: A Lot Help from another person to put on and taking off regular upper body clothing?: A Little Help from another person to put on and taking off regular lower body clothing?: A Lot 6 Click Score: 17   End of Session Equipment Utilized During Treatment: Back brace;Rolling walker (2  wheels)  Activity Tolerance: Patient tolerated treatment well Patient left: in bed;with call bell/phone within reach  OT Visit Diagnosis: Unsteadiness on feet (R26.81);Other abnormalities of gait and mobility (R26.89);Pain                Time: 1000-1016 OT Time Calculation (min): 16 min Charges:  OT General Charges $OT Visit: 1 Visit OT Evaluation $OT Eval Moderate Complexity: [place], OTR/L Acute Rehabilitation Services Office: [phone]   Malka So 08/23/2022, 11:29 AM

## 2022-08-24 LAB — GLUCOSE, CAPILLARY
Glucose-Capillary: 103 mg/dL — ABNORMAL HIGH (ref 70–99)
Glucose-Capillary: 90 mg/dL (ref 70–99)

## 2022-08-24 NOTE — Progress Notes (Signed)
Physical Therapy Treatment Patient Details Name: Deanna Potter MRN: [id number] DOB: 04-18-1951 Today's Date: 08/24/2022   History of Present Illness Pt is a 71 y/o female who presents s/p L3-L5 OLIF on 9/25 followed by L3-L5 posterior spinal fusion on 9/26. PMH signficant for breast CA, cardiomegaly, DM, glaucoma, HTN.    PT Comments    Pt is making slowed progress towards her goals, continues to be limited in safe mobility by increased surgical site pain, in presence of decreased strength and endurance. Pt is currently min A for bed mobility, and min guard for ambulation with RW. Provided education on pain management through short bouts of mobility. D/c plan remains appropriate at this time.     Recommendations for follow up therapy are one component of a multi-disciplinary discharge planning process, led by the attending physician.  Recommendations may be updated based on patient status, additional functional criteria and insurance authorization.  Follow Up Recommendations  Home health PT     Assistance Recommended at Discharge Intermittent Supervision/Assistance  Patient can return home with the following A little help with walking and/or transfers;A little help with bathing/dressing/bathroom;Assistance with cooking/housework;Assist for transportation;Help with stairs or ramp for entrance   Equipment Recommendations  Rolling walker (2 wheels);Hospital bed;BSC/3in1       Precautions / Restrictions Precautions Precautions: Fall;Back Precaution Booklet Issued: Yes (comment) Precaution Comments: pt unable to recall any back precautions, reeducated Required Braces or Orthoses: Spinal Brace Spinal Brace: Lumbar corset;Applied in sitting position Restrictions Weight Bearing Restrictions: No     Mobility  Bed Mobility Overal bed mobility: Needs Assistance Bed Mobility: Rolling, Sit to Sidelying Rolling: Supervision       Sit to sidelying: Min assist General bed mobility  comments: verbal road mapping of return to bed. good ability to come down on her R elbow to bring trunk to bed surface, requires min A for bringing LE into bed before rolling over on her back            Ambulation/Gait Ambulation/Gait assistance: Min guard, Supervision Gait Distance (Feet): 150 Feet Assistive device: Rolling walker (2 wheels) Gait Pattern/deviations: Step-through pattern, Decreased step length - right, Decreased step length - left, Trunk flexed, Shuffle Gait velocity: Decreased Gait velocity interpretation: <1.31 ft/sec, indicative of household ambulator   General Gait Details: min guard to supervision back to min guard with distance and fatigue, pt with minor instability with increased trunk flexion, vc for upright posture       Balance Overall balance assessment: Needs assistance   Sitting balance-Leahy Scale: Good     Standing balance support: No upper extremity supported, During functional activity Standing balance-Leahy Scale: Fair                              Cognition Arousal/Alertness: Awake/alert Behavior During Therapy: WFL for tasks assessed/performed Overall Cognitive Status: Within Functional Limits for tasks assessed                                 General Comments: needing cues to recall back precautions           General Comments General comments (skin integrity, edema, etc.): VSS on RA provided education on short bouts of ambulation throughout day that does not elevate back pain to 9/10, gradually build endurance      Pertinent Vitals/Pain Pain Assessment Pain Assessment: 0-10 Pain Score: 9  Pain  Location: Incision site/back after ambulation Pain Descriptors / Indicators: Operative site guarding, Sore Pain Intervention(s): Limited activity within patient's tolerance, Monitored during session, Repositioned     PT Goals (current goals can now be found in the care plan section) Acute Rehab PT Goals PT  Goal Formulation: With patient Time For Goal Achievement: 08/30/22 Potential to Achieve Goals: Good Progress towards PT goals: Progressing toward goals    Frequency    Min 5X/week      PT Plan Current plan remains appropriate       AM-PAC PT "6 Clicks" Mobility   Outcome Measure  Help needed turning from your back to your side while in a flat bed without using bedrails?: A Little Help needed moving from lying on your back to sitting on the side of a flat bed without using bedrails?: A Little Help needed moving to and from a bed to a chair (including a wheelchair)?: A Little Help needed standing up from a chair using your arms (e.g., wheelchair or bedside chair)?: A Little Help needed to walk in hospital room?: A Little Help needed climbing 3-5 steps with a railing? : A Little 6 Click Score: 18    End of Session Equipment Utilized During Treatment: Gait belt;Back brace Activity Tolerance: Patient tolerated treatment well Patient left: in bed;with call bell/phone within reach;Other (comment) Nurse Communication: Mobility status PT Visit Diagnosis: Unsteadiness on feet (R26.81);Pain Pain - part of body:  (back)     Time: 0940-1003 PT Time Calculation (min) (ACUTE ONLY): 23 min  Charges:  $Gait Training: 8-22 mins $Therapeutic Activity: 8-22 mins                     Javell Blackburn B. Migdalia Dk PT, DPT Acute Rehabilitation Services Please use secure chat or  Call Office [phone]    [place] 08/24/2022, 1:32 PM

## 2022-08-24 NOTE — TOC Transition Note (Signed)
Transition of Care [facility]) - CM/SW Discharge Note   Patient Details  Name: Vanity Larsson Petroni MRN: [id number] Date of Birth: 1951-11-05  [facility] CM/SW Contact:  Pollie Friar, RN Phone Number: 08/24/2022, 10:41 AM   Clinical Narrative:    Pt is discharging home with home health services through [facility]. Information on the AVS.  Hospital bed for home ordered through [facility] and has been delivered to the home.  Pt has transportation home today.    Final next level of care: Home w Home Health Services Barriers to Discharge: No Barriers Identified   Patient Goals and CMS Choice   CMS Medicare.gov Compare Post Acute Care list provided to:: Patient Choice offered to / list presented to : Patient  Discharge Placement                       Discharge Plan and Services                DME Arranged: Hospital bed DME Agency: [employer] Date DME Agency Contacted: 08/23/22   Representative spoke with at DME Agency: [place]: PT [place]: [place] Date [place]: 08/24/22   Representative spoke with at [place]: [place] ([place]) Interventions     Readmission Risk Interventions     No data to display

## 2022-08-24 NOTE — Discharge Summary (Signed)
Patient ID: Deanna Potter MRN: [id number] DOB/AGE: [birth year] 71 y.o.  Admit date: 08/21/2022 Discharge date: 08/24/2022  Admission Diagnoses:  Principal Problem:   S/P lumbar fusion   Discharge Diagnoses:  Principal Problem:   S/P lumbar fusion  status post Procedure(s): Lumbar Three-Four, Lumbar Four-Five Posterior Spinal Fusion  Past Medical History:  Diagnosis Date   Anxiety    Cancer ([place]) 03/21/2020   Breast Cancer   Cardiomegaly 03/13/2019   Diabetes mellitus without complication (HCC)    type 2   Glaucoma    HTN (hypertension)    Lumbar spine pain    OSA on CPAP    Wears hearing aid     Surgeries: Procedure(s): Lumbar Three-Four, Lumbar Four-Five Posterior Spinal Fusion on 08/22/2022   Consultants: Treatment Team:  Marty Heck, MD  Discharged Condition: Improved  Hospital Course: Deanna Potter is an 71 y.o. female who was admitted 08/21/2022 for operative treatment of S/P lumbar fusion. Patient failed conservative treatments (please see the history and physical for the specifics) and had severe unremitting pain that affects sleep, daily activities and work/hobbies. After pre-op clearance, the patient was taken to the operating room on 08/22/2022 and underwent  Procedure(s): Lumbar Three-Four, Lumbar Four-Five Posterior Spinal Fusion.    Patient was given perioperative antibiotics:  Anti-infectives (From admission, onward)    Start     Dose/Rate Route Frequency Ordered Stop   08/22/22 1600  ceFAZolin (ANCEF) IVPB 1 g/50 mL premix        1 g 100 mL/hr over 30 Minutes Intravenous Every 8 hours 08/22/22 1157 08/22/22 2345   08/22/22 0711  ceFAZolin (ANCEF) 2-4 GM/100ML-% IVPB       Note to Pharmacy: Maude Leriche: cabinet override      08/22/22 0711 08/22/22 1929   08/21/22 1330  ceFAZolin (ANCEF) IVPB 1 g/50 mL premix        1 g 100 mL/hr over 30 Minutes Intravenous Every 8 hours 08/21/22 1322 08/21/22 2049   08/21/22 0725  ceFAZolin (ANCEF)  IVPB 2g/100 mL premix        2 g 200 mL/hr over 30 Minutes Intravenous 30 min pre-op 08/21/22 0725 08/21/22 0755   08/21/22 0702  ceFAZolin (ANCEF) 2-4 GM/100ML-% IVPB       Note to Pharmacy: Alba Cory B: cabinet override      08/21/22 0702 08/21/22 0857        Patient was given sequential compression devices and early ambulation to prevent DVT.   Patient benefited maximally from hospital stay and there were no complications. At the time of discharge, the patient was urinating/moving their bowels without difficulty, tolerating a regular diet, pain is controlled with oral pain medications and they have been cleared by PT/OT.   Recent vital signs: Patient Vitals for the past 24 hrs:  BP Temp Temp src Pulse Resp SpO2  08/24/22 1243 125/63 99.8 F (37.7 C) Oral 71 20 99 %  08/24/22 0700 (!) 117/59 98.6 F (37 C) Oral 62 18 95 %  08/24/22 0348 127/60 (!) 100.9 F (38.3 C) Oral 77 18 96 %  08/23/22 2316 131/71 100.2 F (37.9 C) Oral 63 18 97 %  08/23/22 2023 (!) 136/59 99.5 F (37.5 C) -- 72 18 96 %  08/23/22 1608 133/73 (!) 100.8 F (38.2 C) Oral 75 16 99 %     Recent laboratory studies:  Recent Labs    08/22/22 0811  HGB 11.9*  HCT 35.0*  NA 139  K 4.4  Discharge Medications:   Allergies as of 08/24/2022   No Known Allergies      Medication List     STOP taking these medications    ibuprofen 200 MG tablet Commonly known as: ADVIL   NON FORMULARY   rosuvastatin 5 MG tablet Commonly known as: CRESTOR   solifenacin 5 MG tablet Commonly known as: VESICARE       TAKE these medications    Accu-Chek Guide test strip Generic drug: glucose blood Use to check blood sugar once daily.   Accu-Chek Guide w/Device Kit Use to check blood sugar once daily.   Accu-Chek Softclix Lancets lancets Use to check blood sugar once daily.   amLODipine 10 MG tablet Commonly known as: NORVASC Take 1 tablet (10 mg total) by mouth daily.   diphenhydrAMINE 25 MG  tablet Commonly known as: BENADRYL Take 25 mg by mouth at bedtime as needed for itching.   escitalopram 10 MG tablet Commonly known as: LEXAPRO Take 1 tablet by mouth every day   latanoprost 0.005 % ophthalmic solution Commonly known as: XALATAN Place 1 drop into both eyes at bedtime.   MAGNESIUM PO Take 1 tablet by mouth daily.   methocarbamol 500 MG tablet Commonly known as: ROBAXIN Take 1 tablet (500 mg total) by mouth every 8 (eight) hours as needed for up to 5 days for muscle spasms.   metoprolol succinate 50 MG 24 hr tablet Commonly known as: TOPROL-XL Take 1 tablet by mouth every day with a meal   ondansetron 4 MG tablet Commonly known as: Zofran Take 1 tablet (4 mg total) by mouth every 8 (eight) hours as needed for nausea or vomiting.   oxyCODONE-acetaminophen 10-325 MG tablet Commonly known as: Percocet Take 1 tablet by mouth every 6 (six) hours as needed for up to 5 days for pain.   traZODone 100 MG tablet Commonly known as: DESYREL Take 1 to 2 tablets by mouth at bedtime for sleep What changed: See the new instructions.   zinc gluconate 50 MG tablet Take 50 mg by mouth daily.               Durable Medical Equipment  (From admission, onward)           Start     Ordered   08/23/22 0949  For home use only DME Hospital bed  Once       Question Answer Comment  Length of Need 6 Months   The above medical condition requires: Patient requires the ability to reposition frequently   Head must be elevated greater than: 30 degrees   Bed type Semi-electric      08/23/22 0948   08/23/22 0944  For home use only DME Hospital bed  Once       Question Answer Comment  Length of Need 6 Months   Bed type Semi-electric      08/23/22 0944            Diagnostic Studies: DG Lumbar Spine 2-3 Views  Result Date: 08/22/2022 CLINICAL DATA:  L3-L4 posterior paraspinal fusion. EXAM: LUMBAR SPINE - 2-3 VIEW COMPARISON:  Intraoperative lumbar spine  radiographs-08/21/2022 FLUOROSCOPY TIME: FLUOROSCOPY TIME 133 seconds (94.1 mGy) FINDINGS: Three spot intraoperative fluoroscopic images of the lower lumbar spine are provided for review and demonstrate the sequela of L3-L5 posterior paraspinal fusion. Stable sequela of prior L3-L4 and L4-L5 intervertebral disc space replacement. Minimal anterolisthesis of L3 upon L4 and L4 upon L5, unchanged. Surgical clips overlie the anterior aspect of  the L4 vertebral body, unchanged. No radiopaque foreign body. IMPRESSION: Post L3-L5 posterior paraspinal fusion without evidence of complication. Electronically Signed   By: Sandi Mariscal M.D.   On: 08/22/2022 10:54   DG C-Arm 1-60 Min-No Report  Result Date: 08/22/2022 Fluoroscopy was utilized by the requesting physician.  No radiographic interpretation.   DG C-Arm 1-60 Min-No Report  Result Date: 08/22/2022 Fluoroscopy was utilized by the requesting physician.  No radiographic interpretation.   DG Lumbar Spine 2-3 Views  Result Date: 08/21/2022 CLINICAL DATA:  Interbody fusion L3-L5 EXAM: LUMBAR SPINE - 2-3 VIEW; DG C-ARM 1-60 MIN-NO REPORT COMPARISON:  None Available. FLUOROSCOPY: Air kerma 129.17 mGy FINDINGS: Intraoperative fluoroscopic images of the lumbar spine demonstrate discectomy and disc spacers of L3-L5. IMPRESSION: Intraoperative fluoroscopic images of the lumbar spine demonstrate discectomy and disc spacers of L3-L5. Electronically Signed   By: Delanna Ahmadi M.D.   On: 08/21/2022 13:30   DG C-Arm 1-60 Min-No Report  Result Date: 08/21/2022 CLINICAL DATA:  Interbody fusion L3-L5 EXAM: LUMBAR SPINE - 2-3 VIEW; DG C-ARM 1-60 MIN-NO REPORT COMPARISON:  None Available. FLUOROSCOPY: Air kerma 129.17 mGy FINDINGS: Intraoperative fluoroscopic images of the lumbar spine demonstrate discectomy and disc spacers of L3-L5. IMPRESSION: Intraoperative fluoroscopic images of the lumbar spine demonstrate discectomy and disc spacers of L3-L5. Electronically Signed   By:  Delanna Ahmadi M.D.   On: 08/21/2022 13:30   DG C-Arm 1-60 Min-No Report  Result Date: 08/21/2022 CLINICAL DATA:  Interbody fusion L3-L5 EXAM: LUMBAR SPINE - 2-3 VIEW; DG C-ARM 1-60 MIN-NO REPORT COMPARISON:  None Available. FLUOROSCOPY: Air kerma 129.17 mGy FINDINGS: Intraoperative fluoroscopic images of the lumbar spine demonstrate discectomy and disc spacers of L3-L5. IMPRESSION: Intraoperative fluoroscopic images of the lumbar spine demonstrate discectomy and disc spacers of L3-L5. Electronically Signed   By: Delanna Ahmadi M.D.   On: 08/21/2022 13:30   DG C-Arm 1-60 Min-No Report  Result Date: 08/21/2022 CLINICAL DATA:  Interbody fusion L3-L5 EXAM: LUMBAR SPINE - 2-3 VIEW; DG C-ARM 1-60 MIN-NO REPORT COMPARISON:  None Available. FLUOROSCOPY: Air kerma 129.17 mGy FINDINGS: Intraoperative fluoroscopic images of the lumbar spine demonstrate discectomy and disc spacers of L3-L5. IMPRESSION: Intraoperative fluoroscopic images of the lumbar spine demonstrate discectomy and disc spacers of L3-L5. Electronically Signed   By: Delanna Ahmadi M.D.   On: 08/21/2022 13:30   DG C-Arm 1-60 Min-No Report  Result Date: 08/21/2022 CLINICAL DATA:  Interbody fusion L3-L5 EXAM: LUMBAR SPINE - 2-3 VIEW; DG C-ARM 1-60 MIN-NO REPORT COMPARISON:  None Available. FLUOROSCOPY: Air kerma 129.17 mGy FINDINGS: Intraoperative fluoroscopic images of the lumbar spine demonstrate discectomy and disc spacers of L3-L5. IMPRESSION: Intraoperative fluoroscopic images of the lumbar spine demonstrate discectomy and disc spacers of L3-L5. Electronically Signed   By: Delanna Ahmadi M.D.   On: 08/21/2022 13:30   DG OR LOCAL ABDOMEN  Result Date: 08/21/2022 CLINICAL DATA:  Rule out retained foreign body status post lumbar surgery. EXAM: OR LOCAL ABDOMEN COMPARISON:  None Available. FINDINGS: Status post interbody fusion of L3-4 and L4-5. Multiple surgical staples are seen extending from upper abdomen to lower abdomen. No other definite  radiopaque foreign body is noted. IMPRESSION: Status post interbody fusion of L3-4 and L4-5. Multiple surgical staples are noted. No other radiopaque foreign body seen. These results were called by telephone at the time of interpretation on 08/21/2022 at 11:32 am to provider Carmon Sails, RN in the [place], who verbally acknowledged these results. Electronically Signed   By: Sabino Dick  Jr M.D.   On: 08/21/2022 11:33    Discharge Instructions     Incentive spirometry RT   Complete by: As directed    Incentive spirometry RT   Complete by: As directed         Follow-up Information     Melina Schools, MD Follow up in 2 week(s).   Specialty: Orthopedic Surgery Why: If symptoms worsen, For wound re-check, For suture removal Contact information: [address] [place] [address] [phone]         [facility] Follow up.   Specialty: Home Health Services Why: The home health agency will contact you for the first home visit. Contact information: [address] [place] [address] [phone]                 Discharge Plan:  discharge to home  Disposition: At the time of discharge patient was ambulating and was cleared from physical and occupational therapy standpoint her incisions were clean dry and intact and she had no signs or symptoms of infection.  She was tolerating a regular diet, with no shortness of breath or chest pain.  Patient was voiding spontaneously and her pain was well controlled with oral medications.  Patient be discharged home with appropriate instructions and medications.  She will follow-up with me in 2 weeks for wound check and reevaluation.    Signed: Dahlia Bailiff for Dr. Melina Schools [facility] [phone] 08/24/2022, 3:03 PM

## 2022-08-24 NOTE — Progress Notes (Signed)
Occupational Therapy Treatment Patient Details Name: Deanna Potter MRN: [id number] DOB: 1951/10/23 Today's Date: 08/24/2022   History of present illness Pt is a 71 y/o female who presents s/p L3-L5 OLIF on 9/25 followed by L3-L5 posterior spinal fusion on 9/26. PMH signficant for breast CA, cardiomegaly, DM, glaucoma, HTN.   OT comments  Reeducated pt in back precautions. Educated in use of AE for LB ADLs. Pt demonstrated ability to mobilize to EOB and OOB with supervision adhering to back precautions. She donned her back brace in standing with supervision.    Recommendations for follow up therapy are one component of a multi-disciplinary discharge planning process, led by the attending physician.  Recommendations may be updated based on patient status, additional functional criteria and insurance authorization.    Follow Up Recommendations  No OT follow up    Assistance Recommended at Discharge Frequent or constant Supervision/Assistance  Patient can return home with the following  A little help with walking and/or transfers;A lot of help with bathing/dressing/bathroom;Assistance with cooking/housework;Assist for transportation;Help with stairs or ramp for entrance   Equipment Recommendations  BSC/3in1    Recommendations for Other Services      Precautions / Restrictions Precautions Precautions: Fall;Back Precaution Booklet Issued: Yes (comment) Precaution Comments: pt unable to recall any back precautions, reeducated Required Braces or Orthoses: Spinal Brace Spinal Brace: Lumbar corset;Applied in sitting position       Mobility Bed Mobility Overal bed mobility: Needs Assistance Bed Mobility: Rolling, Sidelying to Sit Rolling: Supervision Sidelying to sit: Supervision       General bed mobility comments: increased time, HOB slightly up    Transfers Overall transfer level: Needs assistance Equipment used: Rolling walker (2 wheels) Transfers: Sit to/from Stand Sit to  Stand: Supervision           General transfer comment: bed at low height     Balance Overall balance assessment: Needs assistance   Sitting balance-Leahy Scale: Good     Standing balance support: No upper extremity supported, During functional activity Standing balance-Leahy Scale: Fair Standing balance comment: stood to don back brace without LOB, RW needed for ambulation                           ADL either performed or assessed with clinical judgement   ADL Overall ADL's : Needs assistance/impaired               Lower Body Bathing Details (indicate cue type and reason): educated pt in use of long handled bath sponge and reacher Upper Body Dressing : Set up;Standing Upper Body Dressing Details (indicate cue type and reason): back brace and front opening gown Lower Body Dressing: Supervision/safety;Sit to/from stand;Sitting/lateral leans Lower Body Dressing Details (indicate cue type and reason): educated in use of reacher, sock aid, long handled shoe horn       Toileting - Clothing Manipulation Details (indicate cue type and reason): instructed to avoid twisting with pericare, option to use tongs     Functional mobility during ADLs: Supervision/safety;Rolling walker (2 wheels)      Extremity/Trunk Assessment              Vision       Perception     Praxis      Cognition Arousal/Alertness: Awake/alert Behavior During Therapy: WFL for tasks assessed/performed Overall Cognitive Status: Within Functional Limits for tasks assessed  General Comments: needing cues to recall back precautions        Exercises      Shoulder Instructions       General Comments      Pertinent Vitals/ Pain       Pain Assessment Pain Assessment: 0-10 Pain Score: 3  Pain Location: Incision site/back Pain Descriptors / Indicators: Operative site guarding, Sore Pain Intervention(s): Monitored during session,  Premedicated before session, Repositioned  Home Living                                          Prior Functioning/Environment              Frequency  Min 2X/week        Progress Toward Goals  OT Goals(current goals can now be found in the care plan section)  Progress towards OT goals: Progressing toward goals  Acute Rehab OT Goals OT Goal Formulation: With patient Time For Goal Achievement: 09/06/22 Potential to Achieve Goals: Good  Plan Discharge plan remains appropriate    Co-evaluation                 AM-PAC OT "6 Clicks" Daily Activity     Outcome Measure   Help from another person eating meals?: None Help from another person taking care of personal grooming?: A Little Help from another person toileting, which includes using toliet, bedpan, or urinal?: A Little Help from another person bathing (including washing, rinsing, drying)?: A Little Help from another person to put on and taking off regular upper body clothing?: None Help from another person to put on and taking off regular lower body clothing?: A Little 6 Click Score: 20    End of Session Equipment Utilized During Treatment: Rolling walker (2 wheels);Back brace  OT Visit Diagnosis: Unsteadiness on feet (R26.81);Other abnormalities of gait and mobility (R26.89);Pain   Activity Tolerance Patient tolerated treatment well   Patient Left Other (comment) (walking with PT)   Nurse Communication          Time: 8676-1950 OT Time Calculation (min): 15 min  Charges: OT General Charges $OT Visit: 1 Visit OT Treatments $Self Care/Home Management : 8-22 mins  Deanna Potter, OTR/L Acute Rehabilitation Services Office: [phone]   Deanna Potter 08/24/2022, 9:50 AM

## 2022-08-24 NOTE — Progress Notes (Signed)
Pt and family given D/C instructions with verbal understanding. Rx's were given to the Pt at D/C. Pt's incision is clean and dry with no sign of infection. Pt's IV was removed prior to D/C. Home Health was arranged per MD order. Pt received RW and 3-n-1 from [employer] at D/C. Pt D/C'd home via wheelchair per MD order. Pt is stable @ D/C and has no other needs at this time. Holli Humbles, RN

## 2022-08-25 ENCOUNTER — Telehealth: Payer: Self-pay

## 2022-08-25 NOTE — Patient Outreach (Signed)
  Care Coordination TOC Note Transition Care Management Unsuccessful Follow-up Telephone Call  Date of discharge and from where:  08/24/22-[place]  Attempts:  1st Attempt  Reason for unsuccessful TCM follow-up call:  Unable to reach patient    [facility] [place] [place] Management Telephonic Care Management Coordinator Direct Phone: [phone] Toll Free: [phone] Fax: [phone]

## 2022-08-25 NOTE — Patient Outreach (Signed)
  Care Coordination TOC Note Transition Care Management Unsuccessful Follow-up Telephone Call  Date of discharge and from where:  08/24/22-[place]  Attempts:  2nd Attempt  Reason for unsuccessful TCM follow-up call:  Unable to reach patient   Hetty Blend [place] Management Telephonic Care Management Coordinator Direct Phone: [phone] Toll Free: [phone] Fax: [phone]

## 2022-08-26 DIAGNOSIS — Z9181 History of falling: Secondary | ICD-10-CM | POA: Diagnosis not present

## 2022-08-26 DIAGNOSIS — G4733 Obstructive sleep apnea (adult) (pediatric): Secondary | ICD-10-CM | POA: Diagnosis not present

## 2022-08-26 DIAGNOSIS — M48062 Spinal stenosis, lumbar region with neurogenic claudication: Secondary | ICD-10-CM | POA: Diagnosis not present

## 2022-08-26 DIAGNOSIS — E1169 Type 2 diabetes mellitus with other specified complication: Secondary | ICD-10-CM | POA: Diagnosis not present

## 2022-08-26 DIAGNOSIS — Z981 Arthrodesis status: Secondary | ICD-10-CM | POA: Diagnosis not present

## 2022-08-26 DIAGNOSIS — I10 Essential (primary) hypertension: Secondary | ICD-10-CM | POA: Diagnosis not present

## 2022-08-26 DIAGNOSIS — Z4789 Encounter for other orthopedic aftercare: Secondary | ICD-10-CM | POA: Diagnosis not present

## 2022-08-26 DIAGNOSIS — H9190 Unspecified hearing loss, unspecified ear: Secondary | ICD-10-CM | POA: Diagnosis not present

## 2022-08-26 DIAGNOSIS — H409 Unspecified glaucoma: Secondary | ICD-10-CM | POA: Diagnosis not present

## 2022-08-26 DIAGNOSIS — C50412 Malignant neoplasm of upper-outer quadrant of left female breast: Secondary | ICD-10-CM | POA: Diagnosis not present

## 2022-08-28 ENCOUNTER — Telehealth: Payer: Self-pay

## 2022-08-28 NOTE — Patient Outreach (Signed)
  Care Coordination TOC Note Transition Care Management Unsuccessful Follow-up Telephone Call  Date of discharge and from where:  08/24/22-[place]  Attempts:  3rd Attempt  Reason for unsuccessful TCM follow-up call:  Unable to reach patient   [facility]etty Blend [place] Management Telephonic Care Management Coordinator Direct Phone: [phone] Toll Free: [phone] Fax: [phone]

## 2022-08-31 DIAGNOSIS — G4733 Obstructive sleep apnea (adult) (pediatric): Secondary | ICD-10-CM | POA: Diagnosis not present

## 2022-08-31 DIAGNOSIS — I10 Essential (primary) hypertension: Secondary | ICD-10-CM | POA: Diagnosis not present

## 2022-08-31 DIAGNOSIS — E1169 Type 2 diabetes mellitus with other specified complication: Secondary | ICD-10-CM | POA: Diagnosis not present

## 2022-08-31 DIAGNOSIS — C50412 Malignant neoplasm of upper-outer quadrant of left female breast: Secondary | ICD-10-CM | POA: Diagnosis not present

## 2022-08-31 DIAGNOSIS — Z981 Arthrodesis status: Secondary | ICD-10-CM | POA: Diagnosis not present

## 2022-08-31 DIAGNOSIS — H9190 Unspecified hearing loss, unspecified ear: Secondary | ICD-10-CM | POA: Diagnosis not present

## 2022-08-31 DIAGNOSIS — M48062 Spinal stenosis, lumbar region with neurogenic claudication: Secondary | ICD-10-CM | POA: Diagnosis not present

## 2022-08-31 DIAGNOSIS — Z4789 Encounter for other orthopedic aftercare: Secondary | ICD-10-CM | POA: Diagnosis not present

## 2022-08-31 DIAGNOSIS — Z9181 History of falling: Secondary | ICD-10-CM | POA: Diagnosis not present

## 2022-08-31 DIAGNOSIS — H409 Unspecified glaucoma: Secondary | ICD-10-CM | POA: Diagnosis not present

## 2022-09-02 DIAGNOSIS — Z9181 History of falling: Secondary | ICD-10-CM | POA: Diagnosis not present

## 2022-09-02 DIAGNOSIS — H409 Unspecified glaucoma: Secondary | ICD-10-CM | POA: Diagnosis not present

## 2022-09-02 DIAGNOSIS — I10 Essential (primary) hypertension: Secondary | ICD-10-CM | POA: Diagnosis not present

## 2022-09-02 DIAGNOSIS — G4733 Obstructive sleep apnea (adult) (pediatric): Secondary | ICD-10-CM | POA: Diagnosis not present

## 2022-09-02 DIAGNOSIS — M48062 Spinal stenosis, lumbar region with neurogenic claudication: Secondary | ICD-10-CM | POA: Diagnosis not present

## 2022-09-02 DIAGNOSIS — Z4789 Encounter for other orthopedic aftercare: Secondary | ICD-10-CM | POA: Diagnosis not present

## 2022-09-02 DIAGNOSIS — E1169 Type 2 diabetes mellitus with other specified complication: Secondary | ICD-10-CM | POA: Diagnosis not present

## 2022-09-02 DIAGNOSIS — C50412 Malignant neoplasm of upper-outer quadrant of left female breast: Secondary | ICD-10-CM | POA: Diagnosis not present

## 2022-09-02 DIAGNOSIS — H9190 Unspecified hearing loss, unspecified ear: Secondary | ICD-10-CM | POA: Diagnosis not present

## 2022-09-02 DIAGNOSIS — Z981 Arthrodesis status: Secondary | ICD-10-CM | POA: Diagnosis not present

## 2022-09-04 ENCOUNTER — Other Ambulatory Visit: Payer: Self-pay | Admitting: Internal Medicine

## 2022-09-04 DIAGNOSIS — H9190 Unspecified hearing loss, unspecified ear: Secondary | ICD-10-CM | POA: Diagnosis not present

## 2022-09-04 DIAGNOSIS — H409 Unspecified glaucoma: Secondary | ICD-10-CM | POA: Diagnosis not present

## 2022-09-04 DIAGNOSIS — Z4789 Encounter for other orthopedic aftercare: Secondary | ICD-10-CM | POA: Diagnosis not present

## 2022-09-04 DIAGNOSIS — I10 Essential (primary) hypertension: Secondary | ICD-10-CM | POA: Diagnosis not present

## 2022-09-04 DIAGNOSIS — M48062 Spinal stenosis, lumbar region with neurogenic claudication: Secondary | ICD-10-CM | POA: Diagnosis not present

## 2022-09-04 DIAGNOSIS — Z9181 History of falling: Secondary | ICD-10-CM | POA: Diagnosis not present

## 2022-09-04 DIAGNOSIS — C50412 Malignant neoplasm of upper-outer quadrant of left female breast: Secondary | ICD-10-CM | POA: Diagnosis not present

## 2022-09-04 DIAGNOSIS — G4733 Obstructive sleep apnea (adult) (pediatric): Secondary | ICD-10-CM | POA: Diagnosis not present

## 2022-09-04 DIAGNOSIS — E1169 Type 2 diabetes mellitus with other specified complication: Secondary | ICD-10-CM | POA: Diagnosis not present

## 2022-09-04 DIAGNOSIS — Z981 Arthrodesis status: Secondary | ICD-10-CM | POA: Diagnosis not present

## 2022-09-06 DIAGNOSIS — M48062 Spinal stenosis, lumbar region with neurogenic claudication: Secondary | ICD-10-CM | POA: Diagnosis not present

## 2022-09-06 DIAGNOSIS — E1169 Type 2 diabetes mellitus with other specified complication: Secondary | ICD-10-CM | POA: Diagnosis not present

## 2022-09-06 DIAGNOSIS — Z9181 History of falling: Secondary | ICD-10-CM | POA: Diagnosis not present

## 2022-09-06 DIAGNOSIS — G4733 Obstructive sleep apnea (adult) (pediatric): Secondary | ICD-10-CM | POA: Diagnosis not present

## 2022-09-06 DIAGNOSIS — I10 Essential (primary) hypertension: Secondary | ICD-10-CM | POA: Diagnosis not present

## 2022-09-06 DIAGNOSIS — C50412 Malignant neoplasm of upper-outer quadrant of left female breast: Secondary | ICD-10-CM | POA: Diagnosis not present

## 2022-09-06 DIAGNOSIS — Z981 Arthrodesis status: Secondary | ICD-10-CM | POA: Diagnosis not present

## 2022-09-06 DIAGNOSIS — Z4789 Encounter for other orthopedic aftercare: Secondary | ICD-10-CM | POA: Diagnosis not present

## 2022-09-06 DIAGNOSIS — H9190 Unspecified hearing loss, unspecified ear: Secondary | ICD-10-CM | POA: Diagnosis not present

## 2022-09-06 DIAGNOSIS — H409 Unspecified glaucoma: Secondary | ICD-10-CM | POA: Diagnosis not present

## 2022-09-08 DIAGNOSIS — Z981 Arthrodesis status: Secondary | ICD-10-CM | POA: Diagnosis not present

## 2022-09-08 DIAGNOSIS — Z9181 History of falling: Secondary | ICD-10-CM | POA: Diagnosis not present

## 2022-09-08 DIAGNOSIS — M48062 Spinal stenosis, lumbar region with neurogenic claudication: Secondary | ICD-10-CM | POA: Diagnosis not present

## 2022-09-08 DIAGNOSIS — G4733 Obstructive sleep apnea (adult) (pediatric): Secondary | ICD-10-CM | POA: Diagnosis not present

## 2022-09-08 DIAGNOSIS — H9190 Unspecified hearing loss, unspecified ear: Secondary | ICD-10-CM | POA: Diagnosis not present

## 2022-09-08 DIAGNOSIS — I10 Essential (primary) hypertension: Secondary | ICD-10-CM | POA: Diagnosis not present

## 2022-09-08 DIAGNOSIS — C50412 Malignant neoplasm of upper-outer quadrant of left female breast: Secondary | ICD-10-CM | POA: Diagnosis not present

## 2022-09-08 DIAGNOSIS — Z4789 Encounter for other orthopedic aftercare: Secondary | ICD-10-CM | POA: Diagnosis not present

## 2022-09-08 DIAGNOSIS — H409 Unspecified glaucoma: Secondary | ICD-10-CM | POA: Diagnosis not present

## 2022-09-08 DIAGNOSIS — E1169 Type 2 diabetes mellitus with other specified complication: Secondary | ICD-10-CM | POA: Diagnosis not present

## 2022-09-11 DIAGNOSIS — E1169 Type 2 diabetes mellitus with other specified complication: Secondary | ICD-10-CM | POA: Diagnosis not present

## 2022-09-11 DIAGNOSIS — H9190 Unspecified hearing loss, unspecified ear: Secondary | ICD-10-CM | POA: Diagnosis not present

## 2022-09-11 DIAGNOSIS — G4733 Obstructive sleep apnea (adult) (pediatric): Secondary | ICD-10-CM | POA: Diagnosis not present

## 2022-09-11 DIAGNOSIS — I10 Essential (primary) hypertension: Secondary | ICD-10-CM | POA: Diagnosis not present

## 2022-09-11 DIAGNOSIS — H409 Unspecified glaucoma: Secondary | ICD-10-CM | POA: Diagnosis not present

## 2022-09-11 DIAGNOSIS — Z4789 Encounter for other orthopedic aftercare: Secondary | ICD-10-CM | POA: Diagnosis not present

## 2022-09-11 DIAGNOSIS — Z981 Arthrodesis status: Secondary | ICD-10-CM | POA: Diagnosis not present

## 2022-09-11 DIAGNOSIS — M48062 Spinal stenosis, lumbar region with neurogenic claudication: Secondary | ICD-10-CM | POA: Diagnosis not present

## 2022-09-11 DIAGNOSIS — C50412 Malignant neoplasm of upper-outer quadrant of left female breast: Secondary | ICD-10-CM | POA: Diagnosis not present

## 2022-09-11 DIAGNOSIS — Z9181 History of falling: Secondary | ICD-10-CM | POA: Diagnosis not present

## 2022-09-15 DIAGNOSIS — E1169 Type 2 diabetes mellitus with other specified complication: Secondary | ICD-10-CM | POA: Diagnosis not present

## 2022-09-15 DIAGNOSIS — C50412 Malignant neoplasm of upper-outer quadrant of left female breast: Secondary | ICD-10-CM | POA: Diagnosis not present

## 2022-09-15 DIAGNOSIS — H409 Unspecified glaucoma: Secondary | ICD-10-CM | POA: Diagnosis not present

## 2022-09-15 DIAGNOSIS — Z4789 Encounter for other orthopedic aftercare: Secondary | ICD-10-CM | POA: Diagnosis not present

## 2022-09-15 DIAGNOSIS — M48062 Spinal stenosis, lumbar region with neurogenic claudication: Secondary | ICD-10-CM | POA: Diagnosis not present

## 2022-09-15 DIAGNOSIS — G4733 Obstructive sleep apnea (adult) (pediatric): Secondary | ICD-10-CM | POA: Diagnosis not present

## 2022-09-15 DIAGNOSIS — I10 Essential (primary) hypertension: Secondary | ICD-10-CM | POA: Diagnosis not present

## 2022-09-15 DIAGNOSIS — Z981 Arthrodesis status: Secondary | ICD-10-CM | POA: Diagnosis not present

## 2022-09-15 DIAGNOSIS — Z9181 History of falling: Secondary | ICD-10-CM | POA: Diagnosis not present

## 2022-09-15 DIAGNOSIS — H9190 Unspecified hearing loss, unspecified ear: Secondary | ICD-10-CM | POA: Diagnosis not present

## 2022-09-18 ENCOUNTER — Encounter: Payer: Self-pay | Admitting: *Deleted

## 2022-09-18 DIAGNOSIS — H401122 Primary open-angle glaucoma, left eye, moderate stage: Secondary | ICD-10-CM | POA: Diagnosis not present

## 2022-09-18 DIAGNOSIS — H401113 Primary open-angle glaucoma, right eye, severe stage: Secondary | ICD-10-CM | POA: Diagnosis not present

## 2022-09-19 DIAGNOSIS — M48062 Spinal stenosis, lumbar region with neurogenic claudication: Secondary | ICD-10-CM | POA: Diagnosis not present

## 2022-09-19 DIAGNOSIS — I10 Essential (primary) hypertension: Secondary | ICD-10-CM | POA: Diagnosis not present

## 2022-09-19 DIAGNOSIS — Z9181 History of falling: Secondary | ICD-10-CM | POA: Diagnosis not present

## 2022-09-19 DIAGNOSIS — E1169 Type 2 diabetes mellitus with other specified complication: Secondary | ICD-10-CM | POA: Diagnosis not present

## 2022-09-19 DIAGNOSIS — G4733 Obstructive sleep apnea (adult) (pediatric): Secondary | ICD-10-CM | POA: Diagnosis not present

## 2022-09-19 DIAGNOSIS — H9190 Unspecified hearing loss, unspecified ear: Secondary | ICD-10-CM | POA: Diagnosis not present

## 2022-09-19 DIAGNOSIS — Z981 Arthrodesis status: Secondary | ICD-10-CM | POA: Diagnosis not present

## 2022-09-19 DIAGNOSIS — H409 Unspecified glaucoma: Secondary | ICD-10-CM | POA: Diagnosis not present

## 2022-09-19 DIAGNOSIS — C50412 Malignant neoplasm of upper-outer quadrant of left female breast: Secondary | ICD-10-CM | POA: Diagnosis not present

## 2022-09-19 DIAGNOSIS — Z4789 Encounter for other orthopedic aftercare: Secondary | ICD-10-CM | POA: Diagnosis not present

## 2022-09-22 DIAGNOSIS — C50412 Malignant neoplasm of upper-outer quadrant of left female breast: Secondary | ICD-10-CM | POA: Diagnosis not present

## 2022-09-22 DIAGNOSIS — G4733 Obstructive sleep apnea (adult) (pediatric): Secondary | ICD-10-CM | POA: Diagnosis not present

## 2022-09-22 DIAGNOSIS — H9190 Unspecified hearing loss, unspecified ear: Secondary | ICD-10-CM | POA: Diagnosis not present

## 2022-09-22 DIAGNOSIS — Z981 Arthrodesis status: Secondary | ICD-10-CM | POA: Diagnosis not present

## 2022-09-22 DIAGNOSIS — H409 Unspecified glaucoma: Secondary | ICD-10-CM | POA: Diagnosis not present

## 2022-09-22 DIAGNOSIS — M48062 Spinal stenosis, lumbar region with neurogenic claudication: Secondary | ICD-10-CM | POA: Diagnosis not present

## 2022-09-22 DIAGNOSIS — E1169 Type 2 diabetes mellitus with other specified complication: Secondary | ICD-10-CM | POA: Diagnosis not present

## 2022-09-22 DIAGNOSIS — Z9181 History of falling: Secondary | ICD-10-CM | POA: Diagnosis not present

## 2022-09-22 DIAGNOSIS — I10 Essential (primary) hypertension: Secondary | ICD-10-CM | POA: Diagnosis not present

## 2022-09-22 DIAGNOSIS — Z4789 Encounter for other orthopedic aftercare: Secondary | ICD-10-CM | POA: Diagnosis not present

## 2022-09-26 DIAGNOSIS — M48062 Spinal stenosis, lumbar region with neurogenic claudication: Secondary | ICD-10-CM | POA: Diagnosis not present

## 2022-09-26 DIAGNOSIS — I10 Essential (primary) hypertension: Secondary | ICD-10-CM | POA: Diagnosis not present

## 2022-09-26 DIAGNOSIS — Z9181 History of falling: Secondary | ICD-10-CM | POA: Diagnosis not present

## 2022-09-26 DIAGNOSIS — Z981 Arthrodesis status: Secondary | ICD-10-CM | POA: Diagnosis not present

## 2022-09-26 DIAGNOSIS — C50412 Malignant neoplasm of upper-outer quadrant of left female breast: Secondary | ICD-10-CM | POA: Diagnosis not present

## 2022-09-26 DIAGNOSIS — H9190 Unspecified hearing loss, unspecified ear: Secondary | ICD-10-CM | POA: Diagnosis not present

## 2022-09-26 DIAGNOSIS — E1169 Type 2 diabetes mellitus with other specified complication: Secondary | ICD-10-CM | POA: Diagnosis not present

## 2022-09-26 DIAGNOSIS — G4733 Obstructive sleep apnea (adult) (pediatric): Secondary | ICD-10-CM | POA: Diagnosis not present

## 2022-09-26 DIAGNOSIS — Z4789 Encounter for other orthopedic aftercare: Secondary | ICD-10-CM | POA: Diagnosis not present

## 2022-09-26 DIAGNOSIS — H409 Unspecified glaucoma: Secondary | ICD-10-CM | POA: Diagnosis not present

## 2022-09-27 DIAGNOSIS — M549 Dorsalgia, unspecified: Secondary | ICD-10-CM | POA: Diagnosis not present

## 2022-09-27 DIAGNOSIS — G8929 Other chronic pain: Secondary | ICD-10-CM | POA: Diagnosis not present

## 2022-09-28 DIAGNOSIS — Z9181 History of falling: Secondary | ICD-10-CM | POA: Diagnosis not present

## 2022-09-28 DIAGNOSIS — Z4789 Encounter for other orthopedic aftercare: Secondary | ICD-10-CM | POA: Diagnosis not present

## 2022-09-28 DIAGNOSIS — Z981 Arthrodesis status: Secondary | ICD-10-CM | POA: Diagnosis not present

## 2022-09-28 DIAGNOSIS — H9190 Unspecified hearing loss, unspecified ear: Secondary | ICD-10-CM | POA: Diagnosis not present

## 2022-09-28 DIAGNOSIS — I10 Essential (primary) hypertension: Secondary | ICD-10-CM | POA: Diagnosis not present

## 2022-09-28 DIAGNOSIS — G4733 Obstructive sleep apnea (adult) (pediatric): Secondary | ICD-10-CM | POA: Diagnosis not present

## 2022-09-28 DIAGNOSIS — H409 Unspecified glaucoma: Secondary | ICD-10-CM | POA: Diagnosis not present

## 2022-09-28 DIAGNOSIS — C50412 Malignant neoplasm of upper-outer quadrant of left female breast: Secondary | ICD-10-CM | POA: Diagnosis not present

## 2022-09-28 DIAGNOSIS — E1169 Type 2 diabetes mellitus with other specified complication: Secondary | ICD-10-CM | POA: Diagnosis not present

## 2022-09-28 DIAGNOSIS — M48062 Spinal stenosis, lumbar region with neurogenic claudication: Secondary | ICD-10-CM | POA: Diagnosis not present

## 2022-09-30 ENCOUNTER — Other Ambulatory Visit: Payer: Self-pay | Admitting: Cardiology

## 2022-10-03 DIAGNOSIS — H409 Unspecified glaucoma: Secondary | ICD-10-CM | POA: Diagnosis not present

## 2022-10-03 DIAGNOSIS — Z4789 Encounter for other orthopedic aftercare: Secondary | ICD-10-CM | POA: Diagnosis not present

## 2022-10-03 DIAGNOSIS — M48062 Spinal stenosis, lumbar region with neurogenic claudication: Secondary | ICD-10-CM | POA: Diagnosis not present

## 2022-10-03 DIAGNOSIS — H9190 Unspecified hearing loss, unspecified ear: Secondary | ICD-10-CM | POA: Diagnosis not present

## 2022-10-03 DIAGNOSIS — G4733 Obstructive sleep apnea (adult) (pediatric): Secondary | ICD-10-CM | POA: Diagnosis not present

## 2022-10-03 DIAGNOSIS — Z981 Arthrodesis status: Secondary | ICD-10-CM | POA: Diagnosis not present

## 2022-10-03 DIAGNOSIS — Z9181 History of falling: Secondary | ICD-10-CM | POA: Diagnosis not present

## 2022-10-03 DIAGNOSIS — C50412 Malignant neoplasm of upper-outer quadrant of left female breast: Secondary | ICD-10-CM | POA: Diagnosis not present

## 2022-10-03 DIAGNOSIS — I10 Essential (primary) hypertension: Secondary | ICD-10-CM | POA: Diagnosis not present

## 2022-10-03 DIAGNOSIS — E1169 Type 2 diabetes mellitus with other specified complication: Secondary | ICD-10-CM | POA: Diagnosis not present

## 2022-10-04 DIAGNOSIS — Z4889 Encounter for other specified surgical aftercare: Secondary | ICD-10-CM | POA: Diagnosis not present

## 2022-10-05 DIAGNOSIS — M48062 Spinal stenosis, lumbar region with neurogenic claudication: Secondary | ICD-10-CM | POA: Diagnosis not present

## 2022-10-05 DIAGNOSIS — H9190 Unspecified hearing loss, unspecified ear: Secondary | ICD-10-CM | POA: Diagnosis not present

## 2022-10-05 DIAGNOSIS — E1169 Type 2 diabetes mellitus with other specified complication: Secondary | ICD-10-CM | POA: Diagnosis not present

## 2022-10-05 DIAGNOSIS — Z981 Arthrodesis status: Secondary | ICD-10-CM | POA: Diagnosis not present

## 2022-10-05 DIAGNOSIS — I10 Essential (primary) hypertension: Secondary | ICD-10-CM | POA: Diagnosis not present

## 2022-10-05 DIAGNOSIS — H409 Unspecified glaucoma: Secondary | ICD-10-CM | POA: Diagnosis not present

## 2022-10-05 DIAGNOSIS — C50412 Malignant neoplasm of upper-outer quadrant of left female breast: Secondary | ICD-10-CM | POA: Diagnosis not present

## 2022-10-05 DIAGNOSIS — Z4789 Encounter for other orthopedic aftercare: Secondary | ICD-10-CM | POA: Diagnosis not present

## 2022-10-05 DIAGNOSIS — Z9181 History of falling: Secondary | ICD-10-CM | POA: Diagnosis not present

## 2022-10-05 DIAGNOSIS — G4733 Obstructive sleep apnea (adult) (pediatric): Secondary | ICD-10-CM | POA: Diagnosis not present

## 2022-10-16 DIAGNOSIS — M5451 Vertebrogenic low back pain: Secondary | ICD-10-CM | POA: Diagnosis not present

## 2022-10-27 DIAGNOSIS — M5451 Vertebrogenic low back pain: Secondary | ICD-10-CM | POA: Diagnosis not present

## 2022-10-30 ENCOUNTER — Other Ambulatory Visit: Payer: Self-pay | Admitting: Cardiology

## 2022-10-31 DIAGNOSIS — M5451 Vertebrogenic low back pain: Secondary | ICD-10-CM | POA: Diagnosis not present

## 2022-11-08 DIAGNOSIS — M5451 Vertebrogenic low back pain: Secondary | ICD-10-CM | POA: Diagnosis not present

## 2022-11-10 DIAGNOSIS — M5451 Vertebrogenic low back pain: Secondary | ICD-10-CM | POA: Diagnosis not present

## 2022-11-15 DIAGNOSIS — M5451 Vertebrogenic low back pain: Secondary | ICD-10-CM | POA: Diagnosis not present

## 2022-11-15 DIAGNOSIS — Z4889 Encounter for other specified surgical aftercare: Secondary | ICD-10-CM | POA: Diagnosis not present

## 2022-11-22 DIAGNOSIS — M5451 Vertebrogenic low back pain: Secondary | ICD-10-CM | POA: Diagnosis not present

## 2022-11-24 DIAGNOSIS — G4733 Obstructive sleep apnea (adult) (pediatric): Secondary | ICD-10-CM | POA: Diagnosis not present

## 2022-11-24 DIAGNOSIS — M5451 Vertebrogenic low back pain: Secondary | ICD-10-CM | POA: Diagnosis not present

## 2022-11-28 ENCOUNTER — Ambulatory Visit: Payer: Medicare Other | Admitting: Internal Medicine

## 2022-12-09 ENCOUNTER — Other Ambulatory Visit: Payer: Self-pay | Admitting: Cardiology

## 2022-12-09 DIAGNOSIS — I10 Essential (primary) hypertension: Secondary | ICD-10-CM

## 2023-01-12 ENCOUNTER — Telehealth: Payer: Self-pay | Admitting: Internal Medicine

## 2023-01-12 ENCOUNTER — Other Ambulatory Visit: Payer: Self-pay | Admitting: Pharmacist

## 2023-01-12 DIAGNOSIS — E1159 Type 2 diabetes mellitus with other circulatory complications: Secondary | ICD-10-CM

## 2023-01-12 MED ORDER — AMLODIPINE BESYLATE 10 MG PO TABS: ORAL_TABLET | ORAL | 0 refills | Status: DC

## 2023-01-12 NOTE — Telephone Encounter (Signed)
Patient needs an appointment. I have refilled a 30 day supply with instructions for the patient to make an appointment.

## 2023-01-12 NOTE — Telephone Encounter (Signed)
Shanon Brow with [facility] is calling on behalf of patient. Shanon Brow says the pharmacy received a denial on the refill request and would like to know why. Medication is amLODipine (NORVASC) 10 MG tablet and patient is completely out of medication. Please follow up with pharmacy. [phone], reference number: [id number]

## 2023-01-18 ENCOUNTER — Ambulatory Visit: Payer: Self-pay | Admitting: Internal Medicine

## 2023-01-18 NOTE — Progress Notes (Signed)
No show

## 2023-01-29 DIAGNOSIS — H401113 Primary open-angle glaucoma, right eye, severe stage: Secondary | ICD-10-CM | POA: Diagnosis not present

## 2023-01-29 DIAGNOSIS — H401122 Primary open-angle glaucoma, left eye, moderate stage: Secondary | ICD-10-CM | POA: Diagnosis not present

## 2023-02-01 ENCOUNTER — Other Ambulatory Visit: Payer: Self-pay | Admitting: Internal Medicine

## 2023-02-01 NOTE — Telephone Encounter (Signed)
Courtesy refill. Future visit in 1 week.  Requested Prescriptions  Pending Prescriptions Disp Refills   escitalopram (LEXAPRO) 10 MG tablet [Pharmacy Med Name: Escitalopram '10mg'$  Tablet] 30 tablet 0    Sig: Take 1 tablet by mouth every day     Psychiatry:  Antidepressants - SSRI Failed - 02/01/2023  1:02 AM      Failed - Valid encounter within last 6 months    Recent Outpatient Visits           6 months ago Type 2 diabetes mellitus with morbid obesity ([place])   [place] Ladell Pier, MD   7 months ago Encounter for [employer] annual wellness exam   [place] Ladell Pier, MD   1 year ago Encounter to establish care   [place], MD       Future Appointments             In 1 week [place], [place]             traZODone ([place]) 100 MG tablet [Pharmacy Med Name: Trazodone Hydrochloride '100mg'$  Tablet] 60 tablet 0    Sig: Take 1 to 2 tablets by mouth at bedtime for sleep     Psychiatry: Antidepressants - Serotonin Modulator Failed - 02/01/2023  1:02 AM      Failed - Valid encounter within last 6 months    Recent Outpatient Visits           6 months ago Type 2 diabetes mellitus with morbid obesity [facility])   [place] Ladell Pier, MD   7 months ago Encounter for [employer] annual wellness exam   [place] Ladell Pier, MD   1 year ago Encounter to establish care   [place], MD       Future Appointments             In 1 week Thereasa Solo, [place]

## 2023-02-05 ENCOUNTER — Ambulatory Visit: Payer: 59 | Admitting: Physician Assistant

## 2023-02-05 ENCOUNTER — Other Ambulatory Visit: Payer: Self-pay | Admitting: Pharmacist

## 2023-02-05 DIAGNOSIS — I152 Hypertension secondary to endocrine disorders: Secondary | ICD-10-CM

## 2023-02-05 MED ORDER — AMLODIPINE BESYLATE 10 MG PO TABS: 10.0000 mg | ORAL_TABLET | Freq: Every day | ORAL | 0 refills | Status: DC

## 2023-02-08 ENCOUNTER — Ambulatory Visit: Payer: 59 | Admitting: Physician Assistant

## 2023-02-09 ENCOUNTER — Telehealth: Payer: Self-pay | Admitting: Internal Medicine

## 2023-02-09 NOTE — Telephone Encounter (Signed)
Copied from [place] [phone]. Topic: General - Other >> Feb 09, 2023  2:19 PM [place] B wrote: Reason for CRM: Collier [place] from [employer]	[facility] called says sent fax of enrollment form for patient on March 11 and wants to know has this been received and when will be sent back? Fax# [phone].

## 2023-02-19 NOTE — Telephone Encounter (Signed)
Called & spoke to Rio from [employer]. Informed that forms have not been received. Confirmed that the original form was faxed to the incorrect location. Forms will be re-faxed to [facility].

## 2023-02-20 DIAGNOSIS — Z79899 Other long term (current) drug therapy: Secondary | ICD-10-CM | POA: Diagnosis not present

## 2023-02-20 DIAGNOSIS — I10 Essential (primary) hypertension: Secondary | ICD-10-CM | POA: Diagnosis not present

## 2023-02-20 DIAGNOSIS — G4733 Obstructive sleep apnea (adult) (pediatric): Secondary | ICD-10-CM | POA: Diagnosis not present

## 2023-02-20 DIAGNOSIS — H547 Unspecified visual loss: Secondary | ICD-10-CM | POA: Diagnosis not present

## 2023-02-20 DIAGNOSIS — E785 Hyperlipidemia, unspecified: Secondary | ICD-10-CM | POA: Diagnosis not present

## 2023-02-20 DIAGNOSIS — N1831 Chronic kidney disease, stage 3a: Secondary | ICD-10-CM | POA: Diagnosis not present

## 2023-02-20 DIAGNOSIS — E559 Vitamin D deficiency, unspecified: Secondary | ICD-10-CM | POA: Diagnosis not present

## 2023-02-20 DIAGNOSIS — M1990 Unspecified osteoarthritis, unspecified site: Secondary | ICD-10-CM | POA: Diagnosis not present

## 2023-02-21 ENCOUNTER — Other Ambulatory Visit: Payer: Self-pay | Admitting: Internal Medicine

## 2023-02-21 DIAGNOSIS — I10 Essential (primary) hypertension: Secondary | ICD-10-CM

## 2023-02-21 DIAGNOSIS — Z4889 Encounter for other specified surgical aftercare: Secondary | ICD-10-CM | POA: Diagnosis not present

## 2023-02-21 MED ORDER — ESCITALOPRAM OXALATE 10 MG PO TABS: 10.0000 mg | ORAL_TABLET | Freq: Every day | ORAL | 2 refills | Status: AC

## 2023-02-21 MED ORDER — METOPROLOL SUCCINATE ER 50 MG PO TB24: ORAL_TABLET | ORAL | 3 refills | Status: DC

## 2023-02-22 ENCOUNTER — Ambulatory Visit: Payer: 59 | Admitting: Student

## 2023-02-27 NOTE — Telephone Encounter (Signed)
Peter from [employer] called back inquiring about paperwork that was faxed previously. He stated he will refax it again to [phone]

## 2023-02-28 ENCOUNTER — Other Ambulatory Visit: Payer: Self-pay | Admitting: Internal Medicine

## 2023-02-28 DIAGNOSIS — I10 Essential (primary) hypertension: Secondary | ICD-10-CM

## 2023-02-28 DIAGNOSIS — I152 Hypertension secondary to endocrine disorders: Secondary | ICD-10-CM

## 2023-02-28 MED ORDER — AMLODIPINE BESYLATE 10 MG PO TABS: 10.0000 mg | ORAL_TABLET | Freq: Every day | ORAL | 1 refills | Status: DC

## 2023-02-28 MED ORDER — METOPROLOL SUCCINATE ER 50 MG PO TB24: ORAL_TABLET | ORAL | 1 refills | Status: DC

## 2023-03-01 NOTE — Telephone Encounter (Signed)
Fax has been received and signed by Dr.Johnson. Form has been successfully faxed on 03/01/2023.

## 2023-03-08 ENCOUNTER — Ambulatory Visit: Payer: 59 | Admitting: Cardiology

## 2023-03-16 ENCOUNTER — Other Ambulatory Visit: Payer: Self-pay | Admitting: Student

## 2023-03-16 DIAGNOSIS — Z1231 Encounter for screening mammogram for malignant neoplasm of breast: Secondary | ICD-10-CM

## 2023-04-02 ENCOUNTER — Other Ambulatory Visit: Payer: Self-pay | Admitting: Internal Medicine

## 2023-04-09 ENCOUNTER — Other Ambulatory Visit: Payer: Self-pay | Admitting: Internal Medicine

## 2023-04-09 NOTE — Telephone Encounter (Signed)
Requested Prescriptions  Pending Prescriptions Disp Refills   escitalopram (LEXAPRO) 10 MG tablet [Pharmacy Med Name: Escitalopram 10mg  Tablet] 30 tablet 11    Sig: Take 1 tablet by mouth every day     Psychiatry:  Antidepressants - SSRI Failed - 04/09/2023  2:01 AM      Failed - Valid encounter within last 6 months    Recent Outpatient Visits           8 months ago Type 2 diabetes mellitus with morbid obesity [facility])   [place] [facility] Marcine Matar, MD   9 months ago Encounter for [employer] annual wellness exam   [facility] Marcine Matar, MD   1 year ago Encounter to establish care   [facility] Marcine Matar, MD       Future Appointments             In 1 month Pat[place]han, Anabel Bene, MD [facility]

## 2023-04-13 ENCOUNTER — Ambulatory Visit: Payer: 59 | Admitting: Cardiology

## 2023-04-24 ENCOUNTER — Other Ambulatory Visit: Payer: Self-pay | Admitting: Internal Medicine

## 2023-04-24 NOTE — Telephone Encounter (Signed)
Medication Refill - Medication: generic Lexapro 10 mg  Has the patient contacted their pharmacy? Yes.   (Agent: If no, request that the patient contact the pharmacy for the refill. If patient does not wish to contact the pharmacy document the reason why and proceed with request.) (Agent: If yes, when and what did the pharmacy advise?)  Preferred Pharmacy (with phone number or street name): [facility] Has the patient been seen for an appointment in the last year OR does the patient have an upcoming appointment? yes  Agent: Please be advised that RX refills may take up to 3 business days. We ask that you follow-up with your pharmacy.

## 2023-04-25 ENCOUNTER — Encounter (HOSPITAL_COMMUNITY): Payer: Self-pay

## 2023-04-25 ENCOUNTER — Emergency Department (HOSPITAL_COMMUNITY): Payer: 59

## 2023-04-25 ENCOUNTER — Emergency Department (HOSPITAL_COMMUNITY)
Admission: EM | Admit: 2023-04-25 | Discharge: 2023-04-25 | Disposition: A | Discharge status: Home / Self Care | Payer: 59 | Attending: Emergency Medicine | Admitting: Emergency Medicine

## 2023-04-25 ENCOUNTER — Other Ambulatory Visit: Payer: Self-pay

## 2023-04-25 DIAGNOSIS — R519 Headache, unspecified: Secondary | ICD-10-CM | POA: Insufficient documentation

## 2023-04-25 DIAGNOSIS — Z20822 Contact with and (suspected) exposure to covid-19: Secondary | ICD-10-CM | POA: Diagnosis not present

## 2023-04-25 DIAGNOSIS — R059 Cough, unspecified: Secondary | ICD-10-CM | POA: Diagnosis present

## 2023-04-25 DIAGNOSIS — R051 Acute cough: Secondary | ICD-10-CM | POA: Insufficient documentation

## 2023-04-25 DIAGNOSIS — R0981 Nasal congestion: Secondary | ICD-10-CM | POA: Diagnosis not present

## 2023-04-25 LAB — RESP PANEL BY RT-PCR (RSV, FLU A&B, COVID)  RVPGX2
Influenza A by PCR: NEGATIVE
Influenza B by PCR: NEGATIVE
Resp Syncytial Virus by PCR: NEGATIVE
SARS Coronavirus 2 by RT PCR: NEGATIVE

## 2023-04-25 LAB — GROUP A STREP BY PCR: Group A Strep by PCR: NOT DETECTED

## 2023-04-25 MED ORDER — AZITHROMYCIN 250 MG PO TABS: 250.0000 mg | ORAL_TABLET | Freq: Every day | ORAL | 0 refills | Status: AC

## 2023-04-25 NOTE — Telephone Encounter (Signed)
Requested medication (s) are due for refill today - yes  Requested medication (s) are on the active medication list -yes  Future visit scheduled -no  Last refill: 02/21/23 #30 2RF  Notes to clinic: Attempted to call patient to schedule appointment- left message to call office- overdue OV  Requested Prescriptions  Pending Prescriptions Disp Refills   escitalopram (LEXAPRO) 10 MG tablet 30 tablet 2    Sig: Take 1 tablet (10 mg total) by mouth daily.     Psychiatry:  Antidepressants - SSRI Failed - 04/24/2023 11:29 AM      Failed - Valid encounter within last 6 months    Recent Outpatient Visits           9 months ago Type 2 diabetes mellitus with morbid obesity [facility])   [facility] [facility] Marcine Matar, MD   10 months ago Encounter for [employer] annual wellness exam   [facility] Marcine Matar, MD   1 year ago Encounter to establish care   [facility] Marcine Matar, MD       Future Appointments             In 2 weeks Patwardhan, Anabel Bene, MD [facility]               Requested Prescriptions  Pending Prescriptions Disp Refills   escitalopram (LEXAPRO) 10 MG tablet 30 tablet 2    Sig: Take 1 tablet (10 mg total) by mouth daily.     Psychiatry:  Antidepressants - SSRI Failed - 04/24/2023 11:29 AM      Failed - Valid encounter within last 6 months    Recent Outpatient Visits           9 months ago Type 2 diabetes mellitus with morbid obesity [facility])   [place] [facility] Marcine Matar, MD   10 months ago Encounter for [employer] annual wellness exam   [facility] Marcine Matar, MD   1 year ago Encounter to establish care   [facility] Marcine Matar, MD       Future Appointments             In 2 weeks Patwardhan, Anabel Bene, MD  [facility]

## 2023-04-25 NOTE — Telephone Encounter (Signed)
Overdue for appointment- 6 month follow up

## 2023-04-25 NOTE — ED Provider Notes (Signed)
[place] EMERGENCY DEPARTMENT AT [facility] Provider Note   CSN: [id number] Arrival date & time: 04/25/23  1112     History  Chief Complaint  Patient presents with   Cough   Nasal Congestion   Headache    Deanna Potter is a 72 y.o. female.  72 year old female with medical history as detailed below presents for evaluation.  Patient reports approximate 2 to 3-week history of persistent cough and mild congestion.  This is associated with mild sore throat and mild intermittent headaches.  Patient reports that she discussed her symptoms with PCP approximately 2 weeks ago.  Patient was advised that her symptoms could be viral in nature.  Patient's symptoms have not improved significantly over the last 2 weeks.  She denies associated fever.  She denies productive cough.    The history is provided by the patient and medical records.       Home Medications Prior to Admission medications   Medication Sig Start Date End Date Taking? Authorizing Provider  Accu-Chek Softclix Lancets lancets Use to check blood sugar once daily. 07/26/22   Marcine Matar, MD  amLODipine (NORVASC) 10 MG tablet Take 1 tablet (10 mg total) by mouth daily. Please make PCP appointment. 02/28/23   Marcine Matar, MD  Blood Glucose Monitoring Suppl (ACCU-CHEK GUIDE) w/Device KIT Use to check blood sugar once daily. 07/26/22   Marcine Matar, MD  diphenhydrAMINE (BENADRYL) 25 MG tablet Take 25 mg by mouth at bedtime as needed for itching.    [provider]  escitalopram (LEXAPRO) 10 MG tablet Take 1 tablet (10 mg total) by mouth daily. 02/21/23   Marcine Matar, MD  glucose blood (ACCU-CHEK GUIDE) test strip Use to check blood sugar once daily. 07/26/22   Marcine Matar, MD  latanoprost (XALATAN) 0.005 % ophthalmic solution Place 1 drop into both eyes at bedtime.    [provider]  MAGNESIUM PO Take 1 tablet by mouth daily.    [provider]  metoprolol  succinate (TOPROL-XL) 50 MG 24 hr tablet Take 1 tablet by mouth every day with a meal 02/28/23   Marcine Matar, MD  ondansetron (ZOFRAN) 4 MG tablet Take 1 tablet (4 mg total) by mouth every 8 (eight) hours as needed for nausea or vomiting. 08/22/22   Venita Lick, MD  rosuvastatin (CRESTOR) 5 MG tablet Take 1 tablet by mouth every day 04/02/23   Custovic, Rozell Searing, DO  traZODone (DESYREL) 100 MG tablet Take 1 to 2 tablets by mouth at bedtime for sleep 02/01/23   Marcine Matar, MD  zinc gluconate 50 MG tablet Take 50 mg by mouth daily.    [provider]      Allergies    Patient has no known allergies.    Review of Systems   Review of Systems  All other systems reviewed and are negative.   Physical Exam Updated Vital Signs BP (!) 150/74 (BP Location: Left Arm)   Pulse (!) 55   Temp 98.5 F (36.9 C) (Oral)   Resp 18   Wt 89 kg   SpO2 100%   BMI 38.32 kg/m  Physical Exam Vitals and nursing note reviewed.  Constitutional:      General: She is not in acute distress.    Appearance: Normal appearance. She is well-developed.  HENT:     Head: Normocephalic and atraumatic.     Mouth/Throat:     Comments: Mild erythema of the posterior pharynx.  No evidence of PTA.  Uvula midline. Eyes:     Conjunctiva/sclera: Conjunctivae normal.     Pupils: Pupils are equal, round, and reactive to light.  Cardiovascular:     Rate and Rhythm: Normal rate and regular rhythm.     Heart sounds: Normal heart sounds.  Pulmonary:     Effort: Pulmonary effort is normal. No respiratory distress.     Breath sounds: Normal breath sounds.  Abdominal:     General: There is no distension.     Palpations: Abdomen is soft.     Tenderness: There is no abdominal tenderness.  Musculoskeletal:        General: No deformity. Normal range of motion.     Cervical back: Normal range of motion and neck supple.  Skin:    General: Skin is warm and dry.  Neurological:     General: No focal deficit  present.     Mental Status: She is alert and oriented to person, place, and time.     ED Results / Procedures / Treatments   Labs (all labs ordered are listed, but only abnormal results are displayed) Labs Reviewed  GROUP A STREP BY PCR  RESP PANEL BY RT-PCR (RSV, FLU A&B, COVID)  [id number]    EKG EKG Interpretation  Date/Time:  Wednesday Apr 25 2023 11:25:37 EDT Ventricular Rate:  58 PR Interval:  243 QRS Duration: 131 QT Interval:  450 QTC Calculation: 442 R Axis:   -36 Text Interpretation: Age not entered, assumed to be  72 years old for purpose of ECG interpretation Sinus rhythm Ventricular premature complex Prolonged PR interval Nonspecific IVCD with LAD Left ventricular hypertrophy Baseline wander in lead(s) V6 Confirmed by Kristine Royal [id number]) on 04/25/2023 11:58:04 AM  Radiology No results found.  Procedures Procedures    Medications Ordered in ED Medications - No data to display  ED Course/ Medical Decision Making/ A&P                             Medical Decision Making Amount and/or Complexity of Data Reviewed Radiology: ordered.  Risk Prescription drug management.    Medical Screen Complete  This patient presented to the ED with complaint of cough, congestion, sore throat.  This complaint involves an extensive number of treatment options. The initial differential diagnosis includes, but is not limited to, viral versus bacterial infection, metabolic abnormality, etc.  This presentation is: Acute, Chronic, Self-Limited, Previously Undiagnosed, Uncertain Prognosis, Complicated, Systemic Symptoms, and Threat to Life/Bodily Function  Patient is presenting with complaint of approximately 2 to 3 days of cough, congestion, sore throat.  Presentation is most consistent with likely lingering viral infection.  However, patient is requesting course of antibiotics given length of symptoms.  Strep and COVID testing are negative.  Flu testing is negative.   Chest x-ray without acute abnormality.  Patient advised that antibiotics are unlikely to improve her symptoms.  However, will prescribe course of Z-Pak as requested.  Close follow-up with PCP is encouraged.  Strict return precautions given and understood.  Additional history obtained: External records from outside sources obtained and reviewed including prior ED visits and prior Inpatient records.    Lab Tests:  I ordered and personally interpreted labs.    Imaging Studies ordered:  I ordered imaging studies including CXR  I independently visualized and interpreted obtained imaging which showed NAD I agree with the radiologist interpretation.  Problem List / ED Course:  Cough  Reevaluation:  After the interventions noted above, I reevaluated the patient and found that they have: improved   Disposition:  After consideration of the diagnostic results and the patients response to treatment, I feel that the patent would benefit from close outpatient followup.          Final Clinical Impression(s) / ED Diagnoses Final diagnoses:  Acute cough    Rx / DC Orders ED Discharge Orders          Ordered    azithromycin (ZITHROMAX) 250 MG tablet  Daily        04/25/23 1343              Wynetta Fines, MD 04/25/23 1450

## 2023-04-25 NOTE — Discharge Instructions (Addendum)
Return for any problem.  ?

## 2023-04-25 NOTE — ED Triage Notes (Signed)
C/o cough, headache, congestion, runny nose, and chest pain x5 days  Denies sob.

## 2023-05-01 ENCOUNTER — Telehealth: Payer: Self-pay | Admitting: *Deleted

## 2023-05-01 ENCOUNTER — Telehealth: Payer: Self-pay | Admitting: Internal Medicine

## 2023-05-01 NOTE — Telephone Encounter (Signed)
Copied from CRM [id number]. Topic: General - Other >> May 01, 2023 10:20 AM Epimenio Foot F wrote: Patient is calling in to inform the office she has a new doctor and will no longer be seeing Dr. Laural Benes.

## 2023-05-01 NOTE — Telephone Encounter (Signed)
Copied from CRM #[id number]. Topic: General - Other >> May 01, 2023 10:20 AM Santiya F wrote: Patient is calling in to inform the office she has a new doctor and will no longer be seeing Dr. Johnson. 

## 2023-05-09 ENCOUNTER — Ambulatory Visit: Payer: 59 | Admitting: Cardiology

## 2023-05-09 ENCOUNTER — Encounter: Payer: Self-pay | Admitting: Cardiology

## 2023-05-09 VITALS — BP 121/86 | HR 62 | Ht 60.0 in | Wt 198.0 lb

## 2023-05-09 DIAGNOSIS — I152 Hypertension secondary to endocrine disorders: Secondary | ICD-10-CM

## 2023-05-09 DIAGNOSIS — I10 Essential (primary) hypertension: Secondary | ICD-10-CM

## 2023-05-09 MED ORDER — AMLODIPINE BESYLATE 5 MG PO TABS: 5.0000 mg | ORAL_TABLET | Freq: Every day | ORAL | 3 refills | Status: DC

## 2023-05-09 NOTE — Progress Notes (Signed)
Follow up visit  Subjective:   Deanna Potter, female    DOB: Jul 15, 1951, 72 y.o.   MRN: [id number]   HPI  Chief Complaint  Patient presents with   Essential hypertension   Follow-up    1 year    72 y.o. 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 She denies chest pain, shortness of breath, palpitations, leg edema, orthopnea, PND, TIA/syncope. Blood pressure is well controlled on amlodipine 5 mg daily.     Current Outpatient Medications:    Accu-Chek Softclix Lancets lancets, Use to check blood sugar once daily., Disp: 100 each, Rfl: 2   amLODipine (NORVASC) 10 MG tablet, Take 1 tablet (10 mg total) by mouth daily. Please make PCP appointment., Disp: 30 tablet, Rfl: 1   azithromycin (ZITHROMAX) 250 MG tablet, Take 1 tablet (250 mg total) by mouth daily. Take first 2 tablets together, then 1 every day until finished., Disp: 6 tablet, Rfl: 0   Blood Glucose Monitoring Suppl (ACCU-CHEK GUIDE) w/Device KIT, Use to check blood sugar once daily., Disp: 1 kit, Rfl: 0   diphenhydrAMINE (BENADRYL) 25 MG tablet, Take 25 mg by mouth at bedtime as needed for itching., Disp: , Rfl:    escitalopram (LEXAPRO) 10 MG tablet, Take 1 tablet (10 mg total) by mouth daily., Disp: 30 tablet, Rfl: 2   glucose blood (ACCU-CHEK GUIDE) test strip, Use to check blood sugar once daily., Disp: 100 each, Rfl: 2   latanoprost (XALATAN) 0.005 % ophthalmic solution, Place 1 drop into both eyes at bedtime., Disp: , Rfl:    MAGNESIUM PO, Take 1 tablet by mouth daily., Disp: , Rfl:    metoprolol succinate (TOPROL-XL) 50 MG 24 hr tablet, Take 1 tablet by mouth every day with a meal, Disp: 30 tablet, Rfl: 1   ondansetron (ZOFRAN) 4 MG tablet, Take 1 tablet (4 mg total) by mouth every 8 (eight) hours as needed for nausea or vomiting., Disp: 20 tablet, Rfl: 0   rosuvastatin (CRESTOR) 5 MG tablet, Take 1 tablet by mouth every  day, Disp: 30 tablet, Rfl: 11   traZODone (DESYREL) 100 MG tablet, Take 1 to 2 tablets by mouth at bedtime for sleep, Disp: 60 tablet, Rfl: 0   zinc gluconate 50 MG tablet, Take 50 mg by mouth daily., Disp: , Rfl:    Cardiovascular & other pertient studies:  Reviewed external labs and tests, independently interpreted  EKG 05/09/2023: Sinus rhythm 57 bpm First degree A-V block  LVH IVCD Nonspecific T-abnormality   Lexiscan (with Mod Bruce protocol) Nuclear stress test 07/12/2022: Myocardial perfusion is normal. Overall LV systolic function is normal without regional wall motion abnormalities. Stress LV EF: 57%. Low risk study. Nondiagnostic ECG stress. The heart rate response was consistent with Regadenoson. The blood pressure response was physiologic. No previous exam available for comparison.   Echocardiogram 08/05/2020: Normal LV systolic function with visual EF 50-55%. Left ventricle cavity is normal in size. Severe left ventricular hypertrophy. Normal global wall motion. Normal diastolic filling pattern, normal LAP. Calculated EF 55%. Mild (Grade I) aortic regurgitation. Mild tricuspid regurgitation. No evidence of pulmonary hypertension. No prior study for comparison.  Recent labs: 08/10/2022: Glucose 92, BUN/Cr 11/1.19. EGFR 49. Na/K 137/3.9. Rest of the CMP normal H/H 13/39. MCV 78. Platelets 348 HbA1C 6.1%  01/10/2022: Chol 143, TG 101, HDL 43, LDL 81 TSH 1.1 normal    Review of Systems  Eyes:  Positive for vision loss in left eye.  Cardiovascular:  Negative for chest pain, dyspnea on exertion, leg swelling, palpitations and syncope.         Vitals:   05/09/23 1502  BP: 121/86  Pulse: 62  SpO2: 98%    Body mass index is 38.67 kg/m. Filed Weights   05/09/23 1502  Weight: 198 lb (89.8 kg)     Objective:   Physical Exam Vitals and nursing note reviewed.  Constitutional:      General: She is not in acute distress. Neck:     Vascular: No JVD.   Cardiovascular:     Rate and Rhythm: Normal rate and regular rhythm.     Heart sounds: Normal heart sounds. No murmur heard. Pulmonary:     Effort: Pulmonary effort is normal.     Breath sounds: Normal breath sounds. No wheezing or rales.  Musculoskeletal:     Right lower leg: No edema.     Left lower leg: No edema.             Visit diagnoses:   ICD-10-CM   1. Essential hypertension  I10 EKG 12-Lead    PCV ECHOCARDIOGRAM COMPLETE    2. Hypertension associated with diabetes (HCC)  E11.59 amLODipine (NORVASC) 5 MG tablet   I15.2        Orders Placed This Encounter  Procedures   EKG 12-Lead   PCV ECHOCARDIOGRAM COMPLETE     Medication changes this visit: There are no discontinued medications.  Meds ordered this encounter  Medications   amLODipine (NORVASC) 5 MG tablet    Sig: Take 1 tablet (5 mg total) by mouth daily. Please make PCP appointment.    Dispense:  90 tablet    Refill:  3    Must have office visit for refills.     Assessment & Recommendations:   72 y.o. African American female with hypertension  Left eye vision loss: Will get records. If central retinal artery occlusion, recommend echocardiogram, cardiac telemetry.  Hypertension: Controlled. Refilled amlodipine 5 mg daily.      [name], MD Pager: [phone] Office: [phone]

## 2023-05-30 ENCOUNTER — Other Ambulatory Visit: Payer: 59

## 2023-06-22 ENCOUNTER — Other Ambulatory Visit: Payer: 59

## 2023-08-15 ENCOUNTER — Other Ambulatory Visit: Payer: Self-pay | Admitting: Hematology and Oncology

## 2023-08-15 DIAGNOSIS — Z9889 Other specified postprocedural states: Secondary | ICD-10-CM

## 2023-08-22 ENCOUNTER — Encounter: Payer: Self-pay | Admitting: Hematology and Oncology

## 2023-08-22 ENCOUNTER — Ambulatory Visit
Admission: RE | Admit: 2023-08-22 | Discharge: 2023-08-22 | Disposition: A | Discharge status: Home / Self Care | Payer: 59 | Source: Ambulatory Visit | Attending: Hematology and Oncology | Admitting: Hematology and Oncology

## 2023-08-22 ENCOUNTER — Other Ambulatory Visit: Payer: Self-pay | Admitting: Hematology and Oncology

## 2023-08-22 DIAGNOSIS — Z9889 Other specified postprocedural states: Secondary | ICD-10-CM

## 2023-08-22 HISTORY — DX: Personal history of irradiation: Z92.3

## 2023-10-13 ENCOUNTER — Other Ambulatory Visit: Payer: Self-pay | Admitting: Internal Medicine

## 2023-10-13 DIAGNOSIS — I10 Essential (primary) hypertension: Secondary | ICD-10-CM

## 2023-10-31 ENCOUNTER — Other Ambulatory Visit: Payer: Self-pay | Admitting: *Deleted

## 2023-10-31 DIAGNOSIS — I10 Essential (primary) hypertension: Secondary | ICD-10-CM

## 2023-10-31 MED ORDER — METOPROLOL SUCCINATE ER 50 MG PO TB24
ORAL_TABLET | ORAL | 1 refills | Status: DC
Start: 2023-10-31 — End: 2024-04-24

## 2023-11-01 ENCOUNTER — Ambulatory Visit (HOSPITAL_COMMUNITY): Payer: 59 | Attending: Cardiology

## 2023-11-08 ENCOUNTER — Ambulatory Visit: Payer: 59 | Attending: Cardiology | Admitting: Cardiology

## 2023-11-08 NOTE — Progress Notes (Unsigned)
  Cardiology Office Note:  .   Date:  11/08/2023  ID:  Deanna Potter, DOB 1951/10/29, MRN [id number] PCP: Hillery Aldo, NP  [facility] Providers Cardiologist:  Truett Mainland, MD PCP: Hillery Aldo, NP  No chief complaint on file.     History of Present Illness: Marland Kitchen    Deanna Potter is a 72 y.o. female with ***  There were no vitals filed for this visit.   ROS: *** ROS   Studies Reviewed: .        *** Independently interpreted ***/202***: Chol ***, TG ***, HDL ***, LDL *** HbA1C ***% Hb *** Cr *** ***  Risk Assessment/Calculations:   {Does this patient have ATRIAL FIBRILLATION?[phone]}     Physical Exam:   Physical Exam   VISIT DIAGNOSES: No diagnosis found.   ASSESSMENT AND PLAN: .    Deanna Potter is a 72 y.o. female with ***  {Are you ordering a CV Procedure (e.g. stress test, cath, DCCV, TEE, etc)?   Press F2        :[id number]}    No orders of the defined types were placed in this encounter.    F/u in ***  Signed, [name], MD

## 2023-11-14 ENCOUNTER — Encounter (INDEPENDENT_AMBULATORY_CARE_PROVIDER_SITE_OTHER): Payer: Medicare Other | Admitting: Ophthalmology

## 2024-02-18 ENCOUNTER — Other Ambulatory Visit: Payer: Self-pay

## 2024-02-18 MED ORDER — ROSUVASTATIN CALCIUM 5 MG PO TABS: 5.0000 mg | ORAL_TABLET | Freq: Every day | ORAL | 2 refills | Status: DC

## 2024-03-09 ENCOUNTER — Other Ambulatory Visit: Payer: Self-pay | Admitting: Cardiology

## 2024-03-09 DIAGNOSIS — I152 Hypertension secondary to endocrine disorders: Secondary | ICD-10-CM

## 2024-04-23 ENCOUNTER — Other Ambulatory Visit: Payer: Self-pay | Admitting: Cardiology

## 2024-04-23 DIAGNOSIS — I10 Essential (primary) hypertension: Secondary | ICD-10-CM

## 2024-05-23 ENCOUNTER — Other Ambulatory Visit: Payer: Self-pay | Admitting: Cardiology

## 2024-06-22 ENCOUNTER — Other Ambulatory Visit: Payer: Self-pay | Admitting: Cardiology

## 2024-06-22 DIAGNOSIS — I152 Hypertension secondary to endocrine disorders: Secondary | ICD-10-CM

## 2024-07-04 ENCOUNTER — Other Ambulatory Visit: Payer: Self-pay | Admitting: Cardiology

## 2024-07-04 DIAGNOSIS — I10 Essential (primary) hypertension: Secondary | ICD-10-CM

## 2024-07-30 ENCOUNTER — Other Ambulatory Visit: Payer: Self-pay | Admitting: Cardiology

## 2024-07-30 DIAGNOSIS — I10 Essential (primary) hypertension: Secondary | ICD-10-CM

## 2024-07-30 DIAGNOSIS — E1159 Type 2 diabetes mellitus with other circulatory complications: Secondary | ICD-10-CM

## 2024-07-31 MED ORDER — AMLODIPINE BESYLATE 5 MG PO TABS: 5.0000 mg | ORAL_TABLET | Freq: Every day | ORAL | 0 refills | Status: AC

## 2024-07-31 MED ORDER — ROSUVASTATIN CALCIUM 5 MG PO TABS: 5.0000 mg | ORAL_TABLET | Freq: Every day | ORAL | 0 refills | Status: AC

## 2024-08-29 ENCOUNTER — Other Ambulatory Visit: Payer: Self-pay | Admitting: Cardiology

## 2024-08-29 DIAGNOSIS — I10 Essential (primary) hypertension: Secondary | ICD-10-CM
